# Patient Record
Sex: Male | Born: 1970
Health system: Southern US, Community
[De-identification: ages and names within clinical notes are randomized; demographics above are authoritative.]

## PROBLEM LIST (undated history)

## (undated) DIAGNOSIS — D689 Coagulation defect, unspecified: Secondary | ICD-10-CM

## (undated) DIAGNOSIS — Z87442 Personal history of urinary calculi: Secondary | ICD-10-CM

## (undated) DIAGNOSIS — I739 Peripheral vascular disease, unspecified: Secondary | ICD-10-CM

## (undated) DIAGNOSIS — I7090 Unspecified atherosclerosis: Secondary | ICD-10-CM

## (undated) DIAGNOSIS — M1990 Unspecified osteoarthritis, unspecified site: Secondary | ICD-10-CM

## (undated) DIAGNOSIS — I8393 Asymptomatic varicose veins of bilateral lower extremities: Secondary | ICD-10-CM

## (undated) HISTORY — DX: Unspecified atherosclerosis: I70.90

## (undated) HISTORY — DX: Unspecified osteoarthritis, unspecified site: M19.90

## (undated) HISTORY — DX: Coagulation defect, unspecified: D68.9

## (undated) HISTORY — DX: Asymptomatic varicose veins of bilateral lower extremities: I83.93

## (undated) HISTORY — DX: Peripheral vascular disease, unspecified: I73.9

---

## 2002-08-12 HISTORY — PX: CHOLECYSTECTOMY: SHX55

## 2003-08-13 HISTORY — PX: GASTRIC BYPASS: SHX52

## 2007-07-07 DIAGNOSIS — M25569 Pain in unspecified knee: Secondary | ICD-10-CM | POA: Insufficient documentation

## 2007-07-07 DIAGNOSIS — M5137 Other intervertebral disc degeneration, lumbosacral region: Secondary | ICD-10-CM | POA: Insufficient documentation

## 2017-07-29 ENCOUNTER — Telehealth: Payer: Self-pay

## 2017-07-29 NOTE — Telephone Encounter (Signed)
If deemed necessary as per primary care physician in the future I would be happy to do workup for low back pain including lumbar spine MRI and EMG and nerve conduction studies. He appears to have had lumbar spine and thoracic spine x-rays in the recent past. We may not have all the records from his outside physician. He was seen by a pain management doctor.  Nothing further needed.

## 2017-07-29 NOTE — Telephone Encounter (Signed)
I called pt per Dr. Rexene Alberts request, and advised him that Dr. Rexene Alberts does not provide any chronic pain management for back pain, including muscle relaxants. She would be more willing to order diagnostic testing but cannot refill his current medications. Pt asked me to cancel his appt for tomorrow with Dr. Rexene Alberts tomorrow and he will seek care with a pain clinic instead. Pt was very appreciative of my call. appt cancelled.

## 2017-07-30 ENCOUNTER — Ambulatory Visit: Payer: Self-pay | Admitting: Neurology

## 2017-08-18 ENCOUNTER — Other Ambulatory Visit: Payer: Self-pay

## 2017-08-18 DIAGNOSIS — I83813 Varicose veins of bilateral lower extremities with pain: Secondary | ICD-10-CM

## 2017-08-22 ENCOUNTER — Ambulatory Visit (HOSPITAL_COMMUNITY)
Admission: RE | Admit: 2017-08-22 | Discharge: 2017-08-22 | Disposition: A | Discharge status: Home / Self Care | Payer: 59 | Source: Ambulatory Visit | Attending: Vascular Surgery | Admitting: Vascular Surgery

## 2017-08-22 ENCOUNTER — Encounter: Payer: Self-pay | Admitting: Vascular Surgery

## 2017-08-22 ENCOUNTER — Ambulatory Visit (INDEPENDENT_AMBULATORY_CARE_PROVIDER_SITE_OTHER): Payer: 59 | Admitting: Vascular Surgery

## 2017-08-22 VITALS — BP 120/75 | HR 64 | Temp 97.4°F | Resp 16 | Ht 76.0 in | Wt 224.0 lb

## 2017-08-22 DIAGNOSIS — I872 Venous insufficiency (chronic) (peripheral): Secondary | ICD-10-CM | POA: Diagnosis not present

## 2017-08-22 DIAGNOSIS — Z6827 Body mass index (BMI) 27.0-27.9, adult: Secondary | ICD-10-CM | POA: Diagnosis not present

## 2017-08-22 DIAGNOSIS — I739 Peripheral vascular disease, unspecified: Secondary | ICD-10-CM | POA: Diagnosis not present

## 2017-08-22 DIAGNOSIS — M79606 Pain in leg, unspecified: Secondary | ICD-10-CM | POA: Diagnosis not present

## 2017-08-22 DIAGNOSIS — I83813 Varicose veins of bilateral lower extremities with pain: Secondary | ICD-10-CM | POA: Insufficient documentation

## 2017-08-22 DIAGNOSIS — R7301 Impaired fasting glucose: Secondary | ICD-10-CM | POA: Diagnosis not present

## 2017-08-22 DIAGNOSIS — G894 Chronic pain syndrome: Secondary | ICD-10-CM | POA: Diagnosis not present

## 2017-08-22 DIAGNOSIS — Z0000 Encounter for general adult medical examination without abnormal findings: Secondary | ICD-10-CM | POA: Diagnosis not present

## 2017-08-22 NOTE — Progress Notes (Signed)
Requested by:  Self referral   Reason for consultation: Bilateral varicose veins    History of Present Illness   Paul Sawyer is a 47 y.o. (January 17, 1971) male prior PE who presents with chief complaint: bilateral leg swelling.   The patient notes he previously had a gastric bypass procedure and concomitant with weight loss, he began developing significant bilateral leg swelling.  The patient's symptoms include: swelling with standing, burning in thighs and calf, and slight bursting sensation Iegs.  The patient has had known history of DVT, known history of varicose vein, no history of venous stasis ulcers, no history of  Lymphedema and known history of skin changes in lower legs.  There is no family history of venous disorders.  The patient has never used compression stockings in the past.  Past Medical History: Morbid obesity Chronic back pain  Past Surgical History: Gastric bypass  Social History   Socioeconomic History  . Marital status: Married    Spouse name: Not on file  . Number of children: Not on file  . Years of education: Not on file  . Highest education level: Not on file  Social Needs  . Financial resource strain: Not on file  . Food insecurity - worry: Not on file  . Food insecurity - inability: Not on file  . Transportation needs - medical: Not on file  . Transportation needs - non-medical: Not on file  Occupational History  . Not on file  Tobacco Use  . Smoking status: Never Smoker  . Smokeless tobacco: Never Used  Substance and Sexual Activity  . Alcohol use: Not on file  . Drug use: Not on file  . Sexual activity: Not on file  Other Topics Concern  . Not on file  Social History Narrative  . Not on file   Family History: patient is unable to detail the medical history of his parents   Current Outpatient Medications  Medication Sig Dispense Refill  . Buprenorphine HCl (BELBUCA) 150 MCG FILM Place inside cheek. Takes 150-263mcg     No current  facility-administered medications for this visit.     No Known Allergies  REVIEW OF SYSTEMS (negative unless checked):   Cardiac:  []  Chest pain or chest pressure? []  Shortness of breath upon activity? []  Shortness of breath when lying flat? []  Irregular heart rhythm?  Vascular:  [x]  Pain in calf, thigh, or hip brought on by walking? []  Pain in feet at night that wakes you up from your sleep? []  Blood clot in your veins? [x]  Leg swelling?  Pulmonary:  []  Oxygen at home? []  Productive cough? []  Wheezing?  Neurologic:  []  Sudden weakness in arms or legs? []  Sudden numbness in arms or legs? []  Sudden onset of difficult speaking or slurred speech? []  Temporary loss of vision in one eye? []  Problems with dizziness?  Gastrointestinal:  []  Blood in stool? []  Vomited blood?  Genitourinary:  []  Burning when urinating? []  Blood in urine?  Psychiatric:  []  Major depression  Hematologic:  []  Bleeding problems? []  Problems with blood clotting?  Dermatologic:  []  Rashes or ulcers?  Constitutional:  []  Fever or chills?  Ear/Nose/Throat:  []  Change in hearing? []  Nose bleeds? []  Sore throat?  Musculoskeletal:  [x]  Back pain? []  Joint pain? []  Muscle pain?   Physical Examination     Vitals:   08/22/17 1518  BP: 120/75  Pulse: 64  Resp: 16  Temp: (!) 97.4 F (36.3 C)  TempSrc: Oral  SpO2:  97%  Weight: 224 lb (101.6 kg)  Height: 6\' 4"  (1.93 m)   Body mass index is 27.27 kg/m.  General Alert, O x 3, WD, NAD  Head [place]/AT,    Ear/Nose/ Throat Hearing grossly intact, nares without erythema or drainage, oropharynx without Erythema or Exudate, Mallampati score: 3,   Eyes PERRLA, EOMI,    Neck Supple, mid-line trachea,    Pulmonary Sym exp, good B air movt, CTA B  Cardiac RRR, Nl S1, S2, no Murmurs, No rubs, No S3,S4  Vascular Vessel Right Left  Radial Palpable Palpable  Brachial Palpable Palpable  Carotid Palpable, No Bruit Palpable, No Bruit  Aorta  Not palpable N/A  Femoral Palpable Palpable  Popliteal Not palpable Not palpable  PT Not palpable Not palpable  DP Faintly palpable Faintly palpable    Gastro- intestinal soft, non-distended, non-tender to palpation, No guarding or rebound, no HSM, no masses, no CVAT B, No palpable prominent aortic pulse,    Musculo- skeletal M/S 5/5 throughout  , Extremities without ischemic changes  , Non-pitting edema present: 1-2+ B, Varicosities present: large varicosities L>R (extending posterior also), No Lipodermatosclerosis present  Neurologic Cranial nerves 2-12 intact , Pain and light touch intact in extremities , Motor exam as listed above  Psychiatric Judgement intact, Mood & affect appropriate for pt's clinical situation  Dermatologic See M/S exam for extremity exam, No rashes otherwise noted  Lymphatic  Palpable lymph nodes: None    Non-invasive Vascular Imaging   BLE Venous Insufficiency Duplex (08/22/2017):   RLE:   no DVT and SVT,   no GSV reflux,   no SSV reflux,  + deep venous reflux: SFJ, CFV  LLE:  no DVT and SVT,   + GSV reflux: proximal segment, 2.7-3.7 mm  no SSV reflux,  + deep venous reflux: SFJ, CFV   Medical Decision Making   Paul Sawyer is a 47 y.o. male who presents with: BLE chronic venous insufficiency (C3 Es,p As,d Pr-o), varicose veins with complications   BLE ABI to screen for PAD given difficulty palpating pulses.  Will obtain in next 4 weeks  Based on the patient's history and examination, I recommend: compressive therapy.  I discussed with the patient the use of her 20-30 mm thigh high compression stockings and need for 3 month trial of such.  Will setup Vein Clinic follow up at next visit as I want verify no arterial issues first.  I suspect the B venous reflux duplex to be inaccurate today due to technical issues, so I would repeat it in 3 months.  Thank you for allowing [place] to participate in this patient's care.   Adele Barthel, MD,  FACS [facility] [place] Office: [phone] Pager: [phone]  08/22/2017, 4:26 PM

## 2017-08-25 NOTE — Addendum Note (Signed)
Addended by: Lianne Cure A on: 08/25/2017 01:23 PM   Modules accepted: Orders

## 2017-09-10 MED FILL — oxyCODONE HCL 5 MG TABS: 5 | 2 days supply | Qty: 12 | Fill #0

## 2017-09-10 MED FILL — AMOXICILLIN 875 MG TABLET: 875 | 7 days supply | Qty: 14 | Fill #0

## 2017-09-10 MED FILL — CHLORHEXIDINE 0.12% RINSE: 0.12 | 16 days supply | Qty: 473 | Fill #0

## 2017-09-10 NOTE — Progress Notes (Signed)
    Established Venous Insufficiency   History of Present Illness   Paul Sawyer is a 47 y.o. (Dec 25, 1970) male who presents with chief complaint: burning in legs.  The patient's symptoms have not progressed.  The patient's symptoms are: swelling in both legs and burning.  The patient is compliant with compression stockings.  The patient returns today for BLE ABI.  The patient's PMH, PSH, SH, and FamHx are unchanged from 08/22/17.  Current Outpatient Medications  Medication Sig Dispense Refill  . Buprenorphine HCl (BELBUCA) 150 MCG FILM Place inside cheek. Takes 150-267mcg     No current facility-administered medications for this visit.    On ROS today: continue neuropathic sx, continue bilateral leg swelling   Physical Examination   Vitals:   09/17/17 0845  BP: 122/79  Pulse: 69  Resp: 18  Temp: 97.6 F (36.4 C)  TempSrc: Oral  SpO2: 97%  Weight: 226 lb (102.5 kg)  Height: 6' 4.5" (1.943 m)   Body mass index is 27.15 kg/m.  General Alert, O x 3, WD, NAD  Pulmonary Sym exp, good B air movt, CTA B  Cardiac RRR, Nl S1, S2, no Murmurs, No rubs, No S3,S4  Vascular Vessel Right Left  Radial Palpable Palpable  Brachial Palpable Palpable  Carotid Palpable, No Bruit Palpable, No Bruit  Aorta Not palpable N/A  Femoral Palpable Palpable  Popliteal Not palpable Not palpable  PT Not palpable Not palpable  DP Faintly palpable Faintly palpable    Gastro- intestinal soft, non-distended, non-tender to palpation, No guarding or rebound, no HSM, no masses, no CVAT B, No palpable prominent aortic pulse,    Musculo- skeletal M/S 5/5 throughout  , Extremities without ischemic changes  , Non-pitting edema present: 1-2+ B, Varicosities present: large varicosities L>R, No Lipodermatosclerosis present  Neurologic Pain and light touch intact in extremities , Motor exam as listed above    Non-invasive Vascular Imaging   ABI (09/17/2017)  R:   ABI: 1.31,   PT: tri  DP:  tri  TBI:  1.19  L:   ABI: 1.18,   PT: tri  DP: tri  TBI: 1.18   Medical Decision Making   Paul Sawyer is a 47 y.o. male who presents with: BLE leg chronic venous insufficiency (C3 Es,p As,d Pr-o)   On ABI, there is no evidence of PAD.  Patient already has follow up with Vein Clinic in 3 months.  Use of compression stockings reiterated.  Thank you for allowing [place] to participate in this patient's care.   Adele Barthel, MD, FACS [facility] [place] Office: [phone] Pager: [phone]

## 2017-09-17 ENCOUNTER — Ambulatory Visit (INDEPENDENT_AMBULATORY_CARE_PROVIDER_SITE_OTHER): Payer: 59 | Admitting: Vascular Surgery

## 2017-09-17 ENCOUNTER — Ambulatory Visit (HOSPITAL_COMMUNITY)
Admission: RE | Admit: 2017-09-17 | Discharge: 2017-09-17 | Disposition: A | Discharge status: Home / Self Care | Payer: 59 | Source: Ambulatory Visit | Attending: Vascular Surgery | Admitting: Vascular Surgery

## 2017-09-17 ENCOUNTER — Other Ambulatory Visit: Payer: Self-pay

## 2017-09-17 ENCOUNTER — Encounter: Payer: Self-pay | Admitting: Vascular Surgery

## 2017-09-17 VITALS — BP 122/79 | HR 69 | Temp 97.6°F | Resp 18 | Ht 76.5 in | Wt 226.0 lb

## 2017-09-17 DIAGNOSIS — I872 Venous insufficiency (chronic) (peripheral): Secondary | ICD-10-CM | POA: Diagnosis not present

## 2017-09-17 DIAGNOSIS — I739 Peripheral vascular disease, unspecified: Secondary | ICD-10-CM | POA: Insufficient documentation

## 2017-09-23 ENCOUNTER — Ambulatory Visit: Payer: Self-pay | Admitting: Diagnostic Neuroimaging

## 2017-09-25 MED FILL — oxyCODONE HCL 5 MG TABS: 5 | 1 days supply | Qty: 8 | Fill #0

## 2017-09-25 MED FILL — CYCLOBENZAPRINE 10 MG TAB: 10 | 21 days supply | Qty: 21 | Fill #0

## 2017-10-13 DIAGNOSIS — M5030 Other cervical disc degeneration, unspecified cervical region: Secondary | ICD-10-CM | POA: Diagnosis not present

## 2017-10-13 DIAGNOSIS — M79669 Pain in unspecified lower leg: Secondary | ICD-10-CM | POA: Diagnosis not present

## 2017-10-13 DIAGNOSIS — F1129 Opioid dependence with unspecified opioid-induced disorder: Secondary | ICD-10-CM | POA: Diagnosis not present

## 2017-10-13 DIAGNOSIS — Z79891 Long term (current) use of opiate analgesic: Secondary | ICD-10-CM | POA: Diagnosis not present

## 2017-10-13 DIAGNOSIS — M545 Low back pain: Secondary | ICD-10-CM | POA: Diagnosis not present

## 2017-10-13 DIAGNOSIS — G894 Chronic pain syndrome: Secondary | ICD-10-CM | POA: Diagnosis not present

## 2017-10-14 MED FILL — SUBOXONE 8 MG-2 MG SL FILM: 8-2 | 28 days supply | Qty: 27 | Fill #0

## 2017-11-19 DIAGNOSIS — M545 Low back pain: Secondary | ICD-10-CM | POA: Diagnosis not present

## 2017-11-19 DIAGNOSIS — G894 Chronic pain syndrome: Secondary | ICD-10-CM | POA: Diagnosis not present

## 2017-11-19 DIAGNOSIS — F1129 Opioid dependence with unspecified opioid-induced disorder: Secondary | ICD-10-CM | POA: Diagnosis not present

## 2017-11-19 DIAGNOSIS — M79669 Pain in unspecified lower leg: Secondary | ICD-10-CM | POA: Diagnosis not present

## 2017-11-21 MED FILL — BUPRENORPHINE HCL-NALOXONE: 8-2 | 28 days supply | Qty: 28 | Fill #0

## 2017-12-16 ENCOUNTER — Other Ambulatory Visit: Payer: Self-pay

## 2017-12-16 ENCOUNTER — Encounter: Payer: Self-pay | Admitting: Vascular Surgery

## 2017-12-16 ENCOUNTER — Ambulatory Visit (INDEPENDENT_AMBULATORY_CARE_PROVIDER_SITE_OTHER): Payer: 59 | Admitting: Vascular Surgery

## 2017-12-16 VITALS — BP 123/77 | HR 69 | Temp 97.8°F | Resp 18 | Ht 76.0 in | Wt 225.0 lb

## 2017-12-16 DIAGNOSIS — I872 Venous insufficiency (chronic) (peripheral): Secondary | ICD-10-CM | POA: Diagnosis not present

## 2017-12-16 NOTE — Progress Notes (Signed)
[facility] [place]  Patient name: Paul Sawyer MRN: [id number] DOB: 1971-07-18 Sex: male  REASON FOR VISIT: Venous hypertension aloe up  HPI: Paul Sawyer is a 47 y.o. male here today for follow-up.  He had undergone prior venous and arterial studies.  He has discomfort with swelling on both lower extremities and reports a stinging discomfort over the varicosities over his anterior thighs and calves.  This is bilateral.  He does have a prior history of pulmonary embolus and DVT.  He feels that this may have been associated with dehydration since he was ill.  He was on appropriate anticoagulation and then this was discontinued.  He has had no other thromboembolic events.  His prior duplex study revealed minimal reflux in either his deep or superficial system and no dilatation in his saphenous veins bilaterally.  He did have reflux at the saphenofemoral junction.  He has worn compression garments and reports these actually make his legs feel worse.  Past Medical History:  Diagnosis Date  . PAD (peripheral artery disease) ([place])   . Varicose veins of both lower extremities     Family History  Problem Relation Age of Onset  . Diabetes Father   . Heart disease Father     SOCIAL HISTORY: Social History   Tobacco Use  . Smoking status: Never Smoker  . Smokeless tobacco: Never Used  Substance Use Topics  . Alcohol use: Yes    Alcohol/week: 0.6 oz    Types: 1 Glasses of wine per week    Comment: weekly    No Known Allergies  Current Outpatient Medications  Medication Sig Dispense Refill  . Buprenorphine HCl (BELBUCA) 150 MCG FILM Place inside cheek. Takes 150-280mcg    . Buprenorphine HCl-Naloxone HCl 8-2 MG FILM   0  . amoxicillin (AMOXIL) 875 MG tablet   0  . chlorhexidine (PERIDEX) 0.12 % solution   0  . cyclobenzaprine (FLEXERIL) 10 MG tablet   0  . oxyCODONE (OXY IR/ROXICODONE) 5 MG immediate release tablet    0   No current facility-administered medications for this visit.     REVIEW OF SYSTEMS:  [X]  denotes positive finding, [ ]  denotes negative finding Cardiac  Comments:  Chest pain or chest pressure:    Shortness of breath upon exertion:    Short of breath when lying flat:    Irregular heart rhythm:        Vascular    Pain in calf, thigh, or hip brought on by ambulation:    Pain in feet at night that wakes you up from your sleep:     Blood clot in your veins: x   Leg swelling:  x         PHYSICAL EXAM: Vitals:   12/16/17 0833  BP: 123/77  Pulse: 69  Resp: 18  Temp: 97.8 F (36.6 C)  TempSrc: Oral  SpO2: 100%  Weight: 225 lb (102.1 kg)  Height: 6\' 4"  (1.93 m)    GENERAL: The patient is a well-nourished male, in no acute distress. The vital signs are documented above. CARDIOVASCULAR: He does have scattered varicosities most prickly over his anterior thighs and calves.  Also scattered telangiectasia bilaterally. PULMONARY: There is good air exchange  MUSCULOSKELETAL: There are no major deformities or cyanosis. NEUROLOGIC: No focal weakness or paresthesias are detected. SKIN: There are no ulcers or rashes noted. PSYCHIATRIC: The patient has a normal affect.  DATA:  I reviewed his duplex and reimaged  his veins with SonoSite ultrasound.  He does have a small caliber saphenous vein bilaterally with no evidence of communication to these varicosities  MEDICAL ISSUES: I see no role for ablation of the saphenous vein since he does not have reflux or dilatation.  I explained the only treatment option would be sclerotherapy of his varicosities which would hopefully improve the symptoms that he has with a burning and stinging over his varicosities.  I doubt that this would improve his lower extremity swelling but would be possible and would be a wait and see after sclerotherapy.  I did explain that unfortunately insurance plans do not cover sclerotherapy and he has discussed that the  procedure and out-of-pocket expense with Richelle Ito.  He will make determination if he wishes to proceed.    Rosetta Posner, MD FACS [facility] Tel [phone] Pager [phone]

## 2017-12-22 DIAGNOSIS — G894 Chronic pain syndrome: Secondary | ICD-10-CM | POA: Diagnosis not present

## 2017-12-22 DIAGNOSIS — F1129 Opioid dependence with unspecified opioid-induced disorder: Secondary | ICD-10-CM | POA: Diagnosis not present

## 2017-12-22 DIAGNOSIS — M545 Low back pain: Secondary | ICD-10-CM | POA: Diagnosis not present

## 2017-12-22 DIAGNOSIS — M79669 Pain in unspecified lower leg: Secondary | ICD-10-CM | POA: Diagnosis not present

## 2017-12-23 MED FILL — BUPRENORPHIN-NALOXON 8-2 MG: 8-2 | 28 days supply | Qty: 28 | Fill #0

## 2018-01-19 DIAGNOSIS — M545 Low back pain: Secondary | ICD-10-CM | POA: Diagnosis not present

## 2018-01-19 DIAGNOSIS — F1129 Opioid dependence with unspecified opioid-induced disorder: Secondary | ICD-10-CM | POA: Diagnosis not present

## 2018-01-19 DIAGNOSIS — M79669 Pain in unspecified lower leg: Secondary | ICD-10-CM | POA: Diagnosis not present

## 2018-01-19 DIAGNOSIS — G894 Chronic pain syndrome: Secondary | ICD-10-CM | POA: Diagnosis not present

## 2018-01-21 DIAGNOSIS — Z79899 Other long term (current) drug therapy: Secondary | ICD-10-CM | POA: Diagnosis not present

## 2018-01-26 DIAGNOSIS — B070 Plantar wart: Secondary | ICD-10-CM | POA: Diagnosis not present

## 2018-01-27 ENCOUNTER — Other Ambulatory Visit: Payer: Self-pay | Admitting: *Deleted

## 2018-01-27 ENCOUNTER — Telehealth: Payer: Self-pay | Admitting: Vascular Surgery

## 2018-01-27 ENCOUNTER — Telehealth: Payer: Self-pay | Admitting: *Deleted

## 2018-01-27 DIAGNOSIS — I83813 Varicose veins of bilateral lower extremities with pain: Secondary | ICD-10-CM

## 2018-01-27 MED FILL — BUPRENORPHIN-NALOXON 8-2 MG: 8-2 | 28 days supply | Qty: 28 | Fill #0

## 2018-01-27 NOTE — Telephone Encounter (Signed)
sch appt spk to pt 04/08/18 3pm [facility] 345pm f/u MD

## 2018-01-27 NOTE — Telephone Encounter (Signed)
The patient called in because he says the stinging pain from his varicosities has gotten worse since his last visit. He is wondering what are his options for treatment. The study preformed in 08/2017 showed small diameters and minimal reflux so Dr. Early thought sclero could help. Pt has swelling too and sclero would not help that. Sclero would not be covered by insurance. The patient would like another reflux study to see if the diameters have enlarged and if the reflux has worsened. He has met his oop max, so repeating the study would be covered by insurance. I have asked that a repeat study be done in Aug/Sept and a visit with Dr. Dickson. This will show if his venous disease has progressed to a point where laser would be beneficial and would meet the ins requirements.  

## 2018-02-05 DIAGNOSIS — R351 Nocturia: Secondary | ICD-10-CM | POA: Diagnosis not present

## 2018-02-05 DIAGNOSIS — R3912 Poor urinary stream: Secondary | ICD-10-CM | POA: Diagnosis not present

## 2018-02-05 DIAGNOSIS — Z87442 Personal history of urinary calculi: Secondary | ICD-10-CM | POA: Diagnosis not present

## 2018-02-05 DIAGNOSIS — N401 Enlarged prostate with lower urinary tract symptoms: Secondary | ICD-10-CM | POA: Diagnosis not present

## 2018-02-25 MED FILL — BUPRENORPHIN-NALOXON 8-2 MG: 8-2 | 28 days supply | Qty: 28 | Fill #1

## 2018-03-23 DIAGNOSIS — M79669 Pain in unspecified lower leg: Secondary | ICD-10-CM | POA: Diagnosis not present

## 2018-03-23 DIAGNOSIS — F1129 Opioid dependence with unspecified opioid-induced disorder: Secondary | ICD-10-CM | POA: Diagnosis not present

## 2018-03-23 DIAGNOSIS — M545 Low back pain: Secondary | ICD-10-CM | POA: Diagnosis not present

## 2018-03-23 DIAGNOSIS — G894 Chronic pain syndrome: Secondary | ICD-10-CM | POA: Diagnosis not present

## 2018-03-25 MED FILL — BUPRENORPHINE HCL-NALOXONE: 8-2 | 28 days supply | Qty: 28 | Fill #0

## 2018-04-08 ENCOUNTER — Ambulatory Visit (HOSPITAL_COMMUNITY)
Admission: RE | Admit: 2018-04-08 | Discharge: 2018-04-08 | Disposition: A | Discharge status: Home / Self Care | Payer: 59 | Source: Ambulatory Visit | Attending: Vascular Surgery | Admitting: Vascular Surgery

## 2018-04-08 ENCOUNTER — Encounter: Payer: Self-pay | Admitting: Vascular Surgery

## 2018-04-08 ENCOUNTER — Ambulatory Visit (INDEPENDENT_AMBULATORY_CARE_PROVIDER_SITE_OTHER): Payer: 59 | Admitting: Vascular Surgery

## 2018-04-08 VITALS — BP 126/75 | HR 62 | Temp 97.3°F | Resp 16 | Ht 76.0 in | Wt 224.0 lb

## 2018-04-08 DIAGNOSIS — I83813 Varicose veins of bilateral lower extremities with pain: Secondary | ICD-10-CM | POA: Insufficient documentation

## 2018-04-08 DIAGNOSIS — I872 Venous insufficiency (chronic) (peripheral): Secondary | ICD-10-CM | POA: Diagnosis not present

## 2018-04-08 NOTE — Progress Notes (Signed)
Patient name: Paul Sawyer MRN: [id number] DOB: [birth year] Sex: male  REASON FOR VISIT:   Follow-up of chronic venous insufficiency.  HPI:   Paul Sawyer is a pleasant 47 y.o. male who was last seen by Dr. Sherren Mocha Early on 12/16/2017.  Patient had discomfort and swelling of both lower extremities with painful varicose veins bilaterally.  Patient also has a history of pulmonary embolus and DVT.  He discussed sclerotherapy with the patient.  The patient comes in for a follow-up visit.  Patient continues to have some aching pain and heaviness in both legs which is aggravated by sitting and standing and relieved somewhat with elevation.  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  He denies any previous history of DVT or phlebitis.  Current Outpatient Medications  Medication Sig Dispense Refill  . amoxicillin (AMOXIL) 875 MG tablet   0  . Buprenorphine HCl (BELBUCA) 150 MCG FILM Place inside cheek. Takes 150-23mcg    . Buprenorphine HCl-Naloxone HCl 8-2 MG FILM   0  . chlorhexidine (PERIDEX) 0.12 % solution   0  . cyclobenzaprine (FLEXERIL) 10 MG tablet   0  . oxyCODONE (OXY IR/ROXICODONE) 5 MG immediate release tablet   0   No current facility-administered medications for this visit.     REVIEW OF SYSTEMS:  [X]  denotes positive finding, [ ]  denotes negative finding Vascular    Leg swelling    Cardiac    Chest pain or chest pressure:    Shortness of breath upon exertion:    Short of breath when lying flat:    Irregular heart rhythm:    Constitutional    Fever or chills:     PHYSICAL EXAM:   Vitals:   04/08/18 1605  BP: 126/75  Pulse: 62  Resp: 16  Temp: (!) 97.3 F (36.3 C)  SpO2: 100%  Weight: 224 lb (101.6 kg)  Height: 6\' 4"  (1.93 m)    GENERAL: The patient is a well-nourished male, in no acute distress. The vital signs are documented above. CARDIOVASCULAR: There is a regular rate and rhythm. PULMONARY: There is good air exchange bilaterally without  wheezing or rales. VENOUS EXAM: The patient has spider veins and reticular veins bilaterally on the thighs and leg. He has mild bilateral lower extremity swelling.  DATA:   VENOUS DUPLEX: I have independently interpreted his venous duplex scan today.  On the right side there is no evidence of DVT or superficial thrombophlebitis.  There is no significant deep venous reflux or superficial venous reflux noted.  On the left side there is no evidence of DVT or superficial thrombophlebitis.  There is deep venous reflux involving the common femoral vein.  There is some reflux at the saphenofemoral junction and very proximal saphenous vein but the vein is competent below that it is not dilated.  MEDICAL ISSUES:   CHRONIC VENOUS INSUFFICIENCY: This patient does have some deep venous reflux on the left but no significant superficial venous reflux.  I have discussed with him the importance of intermittent leg elevation the proper positioning for this.  I also written him a prescription for a treadmill desk as I think this would significantly help his chronic venous disease given that he sits all day at work.  I written a prescription for knee-high compression stockings with a gradient of 15 to 20 mmHg.  I have encouraged him to avoid prolonged sitting and standing.  We discussed the importance of exercise especially walking.  If his symptoms progress in the future and certainly we could repeat his duplex scan.  However, based on his study today he is not a candidate for endovenous laser ablation.  We discussed sclerotherapy but there would be some okay thank you risk of recurrence given his deep venous reflux on the left.  [name] [facility] [phone]

## 2018-04-24 MED FILL — BUPRENORPHINE HCL-NALOXONE: 8-2 | 28 days supply | Qty: 28 | Fill #1

## 2018-05-18 DIAGNOSIS — M79669 Pain in unspecified lower leg: Secondary | ICD-10-CM | POA: Diagnosis not present

## 2018-05-18 DIAGNOSIS — F1129 Opioid dependence with unspecified opioid-induced disorder: Secondary | ICD-10-CM | POA: Diagnosis not present

## 2018-05-18 DIAGNOSIS — G894 Chronic pain syndrome: Secondary | ICD-10-CM | POA: Diagnosis not present

## 2018-05-18 DIAGNOSIS — M545 Low back pain: Secondary | ICD-10-CM | POA: Diagnosis not present

## 2018-05-18 MED FILL — CYCLOBENZAPRINE 10 MG TAB: 10 | 20 days supply | Qty: 60 | Fill #0 | Status: TO

## 2018-05-22 MED FILL — BUPRENORPHINE HCL-NALOXONE: 8-2 | 28 days supply | Qty: 28 | Fill #0

## 2018-05-25 MED FILL — CYCLOBENZAPRINE HCL 10 MG T: 10 | 20 days supply | Qty: 60 | Fill #0

## 2018-07-07 DIAGNOSIS — M9902 Segmental and somatic dysfunction of thoracic region: Secondary | ICD-10-CM | POA: Diagnosis not present

## 2018-07-08 DIAGNOSIS — M9902 Segmental and somatic dysfunction of thoracic region: Secondary | ICD-10-CM | POA: Diagnosis not present

## 2018-07-13 MED FILL — BUPRENORPHINE HCL-NALOXONE: 8-2 | 21 days supply | Qty: 21 | Fill #0

## 2018-07-16 ENCOUNTER — Ambulatory Visit: Payer: 59 | Admitting: *Deleted

## 2018-08-03 DIAGNOSIS — M545 Low back pain: Secondary | ICD-10-CM | POA: Diagnosis not present

## 2018-08-03 DIAGNOSIS — M79669 Pain in unspecified lower leg: Secondary | ICD-10-CM | POA: Diagnosis not present

## 2018-08-03 DIAGNOSIS — F1129 Opioid dependence with unspecified opioid-induced disorder: Secondary | ICD-10-CM | POA: Diagnosis not present

## 2018-08-03 DIAGNOSIS — Z79891 Long term (current) use of opiate analgesic: Secondary | ICD-10-CM | POA: Diagnosis not present

## 2018-08-03 DIAGNOSIS — G894 Chronic pain syndrome: Secondary | ICD-10-CM | POA: Diagnosis not present

## 2018-08-11 MED FILL — CYCLOBENZAPRINE HCL 10 MG T: 10 | 20 days supply | Qty: 60 | Fill #0

## 2018-08-11 MED FILL — BUPRENORPHIN-NALOXON 8-2 MG: 8-2 | 28 days supply | Qty: 28 | Fill #1

## 2018-09-07 MED FILL — BUPRENORPHINE HCL-NALOXONE: 8-2 | 30 days supply | Qty: 30 | Fill #0

## 2018-10-06 MED FILL — BUPRENORPHINE HCL-NALOXONE: 8-2 | 30 days supply | Qty: 30 | Fill #1

## 2018-10-13 MED FILL — CYCLOBENZAPRINE HCL 10 MG T: 10 | 20 days supply | Qty: 60 | Fill #1

## 2018-10-26 DIAGNOSIS — G894 Chronic pain syndrome: Secondary | ICD-10-CM | POA: Diagnosis not present

## 2018-10-26 DIAGNOSIS — M545 Low back pain: Secondary | ICD-10-CM | POA: Diagnosis not present

## 2018-10-26 DIAGNOSIS — M79669 Pain in unspecified lower leg: Secondary | ICD-10-CM | POA: Diagnosis not present

## 2018-10-26 DIAGNOSIS — F1129 Opioid dependence with unspecified opioid-induced disorder: Secondary | ICD-10-CM | POA: Diagnosis not present

## 2018-11-03 MED FILL — BUPRENORPHIN-NALOXON 8-2 MG: 8-2 | 42 days supply | Qty: 42 | Fill #0

## 2018-11-03 MED FILL — CYCLOBENZAPRINE HCL 10 MG T: 10 | 30 days supply | Qty: 30 | Fill #0

## 2018-11-19 ENCOUNTER — Telehealth: Payer: Self-pay | Admitting: *Deleted

## 2018-11-19 NOTE — Telephone Encounter (Signed)
Returning patient's telephone inquiry regarding enlarged veins in his hands and arms and treatment options.  Advised Paul Sawyer that [facility] does not treat veins in arms or hands.   Paul Sawyer was last seen in August 2019 for leg swelling by [name] MD and venous reflux study was also done. Dr. Scot Dock had advised Paul Sawyer to elevate legs when possible, wear knee high compression hose, and walk for exercise. Reviewed the office note and instructions that Dr. Scot Dock had given him.

## 2018-11-27 MED FILL — CYCLOBENZAPRINE HCL 10 MG T: 10 | 30 days supply | Qty: 30 | Fill #1

## 2018-12-11 MED FILL — BUPRENORPHIN-NALOXON 8-2 MG: 8-2 | 42 days supply | Qty: 42 | Fill #1

## 2019-01-15 MED FILL — CYCLOBENZAPRINE HCL 10 MG T: 10 | 30 days supply | Qty: 30 | Fill #2

## 2019-01-18 MED FILL — BUPRENORPHIN-NALOXON 8-2 MG: 8-2 | 42 days supply | Qty: 42 | Fill #2

## 2019-01-19 ENCOUNTER — Telehealth (HOSPITAL_COMMUNITY): Payer: Self-pay | Admitting: Rehabilitation

## 2019-01-19 ENCOUNTER — Other Ambulatory Visit: Payer: Self-pay

## 2019-01-19 DIAGNOSIS — M25529 Pain in unspecified elbow: Secondary | ICD-10-CM

## 2019-01-19 NOTE — Telephone Encounter (Signed)

## 2019-01-20 ENCOUNTER — Ambulatory Visit (HOSPITAL_COMMUNITY)
Admission: RE | Admit: 2019-01-20 | Discharge: 2019-01-20 | Disposition: A | Discharge status: Home / Self Care | Payer: 59 | Source: Ambulatory Visit | Attending: Family | Admitting: Family

## 2019-01-20 ENCOUNTER — Other Ambulatory Visit: Payer: Self-pay

## 2019-01-20 DIAGNOSIS — M25529 Pain in unspecified elbow: Secondary | ICD-10-CM

## 2019-01-26 ENCOUNTER — Telehealth (HOSPITAL_COMMUNITY): Payer: Self-pay | Admitting: Rehabilitation

## 2019-01-26 NOTE — Telephone Encounter (Signed)
The above patient or their representative was contacted and gave the following answers to these questions:         Do you have any of the following symptoms? No Fever                    Cough                   Shortness of breath  Do  you have any of the following other symptoms? No  muscle pain         vomiting,        diarrhea        rash         weakness        red eye        abdominal pain         bruising         bleeding              joint pain           severe headache  Have you been in contact with someone who was or has been sick in the past 2 weeks? No Yes                 Unsure                         Unable to assess   Does the person that you were in contact with have any of the following symptoms?  Cough         shortness of breath           muscle pain         vomiting,            diarrhea            rash            weakness           fever            red eye           abdominal pain          bruising  or  bleeding                joint pain                severe headache             Have you  or someone you have been in contact with traveled internationally in the last month?  No      If yes, which countries?  Have you  or someone you have been in contact with traveled outside [place] in the last month?  No      If yes, which state and city?  COMMENTS OR ACTION PLAN FOR THIS PATIENT:    

## 2019-01-27 ENCOUNTER — Other Ambulatory Visit: Payer: Self-pay

## 2019-01-27 ENCOUNTER — Ambulatory Visit (INDEPENDENT_AMBULATORY_CARE_PROVIDER_SITE_OTHER): Payer: 59 | Admitting: Vascular Surgery

## 2019-01-27 ENCOUNTER — Encounter: Payer: Self-pay | Admitting: Vascular Surgery

## 2019-01-27 VITALS — BP 117/75 | HR 61 | Temp 98.1°F | Resp 20 | Ht 76.0 in | Wt 225.0 lb

## 2019-01-27 DIAGNOSIS — G540 Brachial plexus disorders: Secondary | ICD-10-CM

## 2019-01-27 DIAGNOSIS — I872 Venous insufficiency (chronic) (peripheral): Secondary | ICD-10-CM

## 2019-01-27 NOTE — Progress Notes (Signed)
Patient name: Paul Sawyer MRN: [id number] DOB: 07/16/1971 Sex: male  REASON FOR VISIT:   Bilateral upper extremity pain  HPI:   Paul Sawyer is a pleasant 48 y.o. male who I last saw on 04/08/2018.  The patient at that time had deep venous reflux on the left but no significant superficial venous reflux.  We discussed conservative measures for the treatment of venous insufficiency.  I wrote him a prescription for knee-high compression stockings with a gradient of 15 to 20 mmHg.   Since I saw him last, he had developed some aching and weakness in both arms and spoke to Paul Sawyer in the vein center.  He felt that his symptoms felt much like the symptoms he was having in his leg and he was set up for bilateral venous duplex exam.  He states the symptoms have been going on for several months.  He describes some weakness with activities of his arms such as brushing his teeth.  However the symptoms are not specific to doing activities with his arm over his head.  He denies any arm swelling.  He describes some aching pain in his arms.  He denies any neck pain.  He does not have any occupational history to suggest thoracic outlet syndrome.  He has not had any trauma.  He really does not have significant risk factors for peripheral vascular disease.  He denies any history of diabetes, hypertension, hypercholesterolemia, family history of premature cardiovascular disease, or smoking history.  He has undergone gastric bypass and has lost considerable weight since that time.  Past Medical History:  Diagnosis Date  . PAD (peripheral artery disease) ([place])   . Varicose veins of both lower extremities     Family History  Problem Relation Age of Onset  . Diabetes Father   . Heart disease Father     SOCIAL HISTORY: Social History   Tobacco Use  . Smoking status: Never Smoker  . Smokeless tobacco: Never Used  Substance Use Topics  . Alcohol use: Yes    Alcohol/week: 1.0 standard drinks   Types: 1 Glasses of wine per week    Comment: weekly    Allergies  Allergen Reactions  . Ciprofloxacin     Current Outpatient Medications  Medication Sig Dispense Refill  . Buprenorphine HCl-Naloxone HCl 8-2 MG FILM   0  . cyclobenzaprine (FLEXERIL) 10 MG tablet   0   No current facility-administered medications for this visit.     REVIEW OF SYSTEMS:  [X]  denotes positive finding, [ ]  denotes negative finding Cardiac  Comments:  Chest pain or chest pressure:    Shortness of breath upon exertion:    Short of breath when lying flat:    Irregular heart rhythm:        Vascular    Pain in calf, thigh, or hip brought on by ambulation:    Pain in feet at night that wakes you up from your sleep:     Blood clot in your veins:    Leg swelling:         Pulmonary    Oxygen at home:    Productive cough:     Wheezing:         Neurologic    Sudden weakness in arms or legs:     Sudden numbness in arms or legs:     Sudden onset of difficulty speaking or slurred speech:    Temporary loss of vision in one eye:  Problems with dizziness:         Gastrointestinal    Blood in stool:     Vomited blood:         Genitourinary    Burning when urinating:     Blood in urine:        Psychiatric    Major depression:         Hematologic    Bleeding problems:    Problems with blood clotting too easily:        Skin    Rashes or ulcers:        Constitutional    Fever or chills:     PHYSICAL EXAM:   Vitals:   01/27/19 0916  BP: 117/75  Pulse: 61  Resp: 20  Temp: 98.1 F (36.7 C)  SpO2: 100%  Weight: 225 lb (102.1 kg)  Height: 6\' 4"  (1.93 m)    GENERAL: The patient is a well-nourished male, in no acute distress. The vital signs are documented above. CARDIAC: There is a regular rate and rhythm.  VASCULAR: I do not detect carotid bruits. He has palpable radial pulses and brisk biphasic radial and ulnar signals with Doppler. He has palpable femoral pulses.  He has  biphasic dorsalis pedis and posterior tibial signal in both feet. He has no upper extremity swelling or lower extremity swelling. PULMONARY: There is good air exchange bilaterally without wheezing or rales. ABDOMEN: Soft and non-tender with normal pitched bowel sounds.  MUSCULOSKELETAL: There are no major deformities or cyanosis. NEUROLOGIC: No focal weakness or paresthesias are detected. SKIN: There are no ulcers or rashes noted. PSYCHIATRIC: The patient has a normal affect.  DATA:    UPPER EXTREMITY VENOUS DUPLEX: I reviewed his bilateral upper extremity venous duplex scan that was done on 01/20/2019.  This showed no evidence of deep venous thrombosis or superficial venous thrombosis involving either upper extremity.  MEDICAL ISSUES:   BILATERAL UPPER EXTREMITY WEAKNESS AND ACHING PAIN: I reassured him that he has no evidence of venous disease in either upper extremity based on his duplex scan.  He is had no arm swelling.  I suspect that he could potentially have a neurogenic component to his symptoms possibly related to cervical disc disease or possibly related to thoracic outlet syndrome.  I also reassured him that he had no evidence of arterial insufficiency.  If his symptoms progress then I think the next logical step would be consultation with a neurologist for further work-up here in [place].  If that were not fruitful then I think he could be evaluated by Paul Sawyer at [facility], The he does have significant experience with thoracic outlet syndrome and has the resources to also address neurogenic thoracic outlet syndrome if that were a potential cause.  I will be happy to see him back at any time if other vascular issues arise.  Paul Sawyer [facility] [phone]

## 2019-02-02 DIAGNOSIS — M545 Low back pain: Secondary | ICD-10-CM | POA: Diagnosis not present

## 2019-02-02 DIAGNOSIS — F1129 Opioid dependence with unspecified opioid-induced disorder: Secondary | ICD-10-CM | POA: Diagnosis not present

## 2019-02-02 DIAGNOSIS — M79669 Pain in unspecified lower leg: Secondary | ICD-10-CM | POA: Diagnosis not present

## 2019-02-02 DIAGNOSIS — G894 Chronic pain syndrome: Secondary | ICD-10-CM | POA: Diagnosis not present

## 2019-02-02 MED FILL — MELOXICAM 15 MG TABLET: 15 | 30 days supply | Qty: 30 | Fill #0

## 2019-02-18 ENCOUNTER — Ambulatory Visit (HOSPITAL_COMMUNITY): Payer: 59 | Attending: Neurology

## 2019-02-18 ENCOUNTER — Encounter (HOSPITAL_COMMUNITY): Payer: Self-pay

## 2019-02-18 ENCOUNTER — Other Ambulatory Visit: Payer: Self-pay

## 2019-02-18 DIAGNOSIS — M545 Low back pain, unspecified: Secondary | ICD-10-CM

## 2019-02-18 DIAGNOSIS — M6281 Muscle weakness (generalized): Secondary | ICD-10-CM | POA: Insufficient documentation

## 2019-02-18 DIAGNOSIS — G8929 Other chronic pain: Secondary | ICD-10-CM | POA: Diagnosis not present

## 2019-02-18 DIAGNOSIS — R29898 Other symptoms and signs involving the musculoskeletal system: Secondary | ICD-10-CM | POA: Insufficient documentation

## 2019-02-18 NOTE — Therapy (Addendum)
[place], [place], [address] Phone: [phone]   Fax:  [phone]  Physical Therapy Evaluation  Patient Details  Name: Paul Sawyer MRN: [id number] Date of Birth: [birth year] Referring Provider (PT):  Phillips Odor, MD   Encounter Date: 02/18/2019  PT End of Session - 02/18/19 0907    Visit Number  1    Number of Visits  8    Date for PT Re-Evaluation  03/18/19    Authorization Type  [facility]    Authorization Time Period  02/18/19 to 03/18/19    PT Start Time  0820    PT Stop Time  0903    PT Time Calculation (min)  43 min    Activity Tolerance  Patient tolerated treatment well    Behavior During Therapy  [facility] for tasks assessed/performed       Past Medical History:  Diagnosis Date  . PAD (peripheral artery disease) ([place])   . Varicose veins of both lower extremities     Past Surgical History:  Procedure Laterality Date  . CHOLECYSTECTOMY  2004  . GASTRIC BYPASS  2005    There were no vitals filed for this visit.   Subjective Assessment - 02/18/19 0823    Subjective  Pt reports 15 years ago he found out he has annular tears and DDD at L4-5. Pt reports experiencing chronic pain for years and needing to build his core strentgth up. Pt reports thoracic outlet syndrome due to poor posture. Pt also reports venous insufficiency pain that improves with pain medication. Pt reports most difficulty with hygeine, prolonged cleaning, cooking, walking and standing for long periods. Pt denies numbness/tingling throughout BLE and denies b/b issues. Pt reports some lateral L foot tingling peridoically. Pt reports not knowing if thoracic outlet syndrome issues are vascular or nerve. Pt reports heaviness through BUE and BLE 24/7. Pt reports increase in pain when performing tasks in prolonged positions and relief with pain medication and lying supine to rest. Pt reports ability to walk on treadmill for 1-2 miles but  experiences heaviness sensation throughout extremities.    Limitations  Lifting;Standing;Walking;House hold activities    How long can you sit comfortably?  no issues    How long can you stand comfortably?  30 minutes    How long can you walk comfortably?  1-2 miles, heaviness through BUE and BLE    Patient Stated Goals  Get back cramping to stop, improve overall strength of back  and posture    Currently in Pain?  Yes   Pt reports "discomfort" throughout BLE/BUE   Pain Score  3     Pain Location  Back    Pain Orientation  Lower    Pain Descriptors / Indicators  Throbbing    Pain Type  Chronic pain    Pain Radiating Towards  none    Pain Onset  More than a month ago    Pain Frequency  Constant    Aggravating Factors   bending over    Pain Relieving Factors  pain medication, laying supine    Effect of Pain on Daily Activities  increased         [facility] PT Assessment - 02/18/19 0001      Assessment   Medical Diagnosis  LBP    Referring Provider (PT)   Phillips Odor, MD    Onset Date/Surgical Date  --   approximately 15 years ago   Next MD Visit  4-6 weeks    Prior Therapy  yes for LBP, no results due to lack of consistency      Precautions   Precautions  None      Restrictions   Weight Bearing Restrictions  No      Balance Screen   Has the patient fallen in the past 6 months  No    Has the patient had a decrease in activity level because of a fear of falling?   No    Is the patient reluctant to leave their home because of a fear of falling?   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    Leisure  hanging out with family      Observation/Other Assessments   Focus on Therapeutic Outcomes (FOTO)   to be compelted next time      Sensation   Light Touch  Appears Intact      Functional Tests   Functional tests  Sit to Stand      Sit to Stand    Comments  30 sec STS: 14 reps      Posture/Postural Control   Posture/Postural Control  Postural limitations    Postural Limitations  Rounded Shoulders;Forward head;Increased thoracic kyphosis;Decreased lumbar lordosis;Posterior pelvic tilt    Posture Comments  In supine, head and posterior shoulders unable to touch mat due to rounded shoulders/forward head      ROM / Strength   AROM / PROM / Strength  AROM;Strength      AROM   AROM Assessment Site  Lumbar    Lumbar Flexion  9.5 in from floor    Lumbar Extension  WFL    Lumbar - Right Side Bend  23.5 in from floor    Lumbar - Left Side Bend  23 in from floor    Lumbar - Right Rotation  WFL    Lumbar - Left Rotation  [facility]      Strength   Strength Assessment Site  Hip;Knee;Ankle    Right Hip Flexion  4/5    Right Hip Extension  3+/5    Right Hip ABduction  3+/5    Left Hip Flexion  4/5    Left Hip Extension  3+/5    Left Hip ABduction  3+/5    Right Knee Flexion  3+/5    Right Knee Extension  5/5    Left Knee Flexion  3+/5    Left Knee Extension  5/5    Right Ankle Dorsiflexion  5/5    Left Ankle Dorsiflexion  5/5      Flexibility   Soft Tissue Assessment /Muscle Length  yes    Hamstrings  90/90: 115 deg bil    Quadriceps  + Ely's bilaterally   in prone, knee flexion to 95 deg bil     Palpation   Spinal mobility  Hypomobility throughout all lumbar and thoracic vertebrae    Palpation comment  Pt denies TTP throughout lumbar paraspinals      Special Tests    Special Tests  Lumbar    Lumbar Tests  Slump Test      Slump test   Findings  Negative    Comment  bilaterally      Balance   Balance Assessed  Yes      Static Standing Balance   Static  Standing - Balance Support  No upper extremity supported    Static Standing Balance -  Activities   Single Leg Stance - Right Leg;Single Leg Stance - Left Leg    Static Standing - Comment/# of Minutes  R: 17.5 sec, L: 45 sec       Objective measurements completed on  examination: See above findings.    PT Education - 02/18/19 0907    Education Details  Assessment findings, POC, initiated HEP    Person(s) Educated  Patient    Methods  Explanation;Demonstration;Handout    Comprehension  Verbalized understanding;Returned demonstration       PT Short Term Goals - 02/18/19 1031      PT SHORT TERM GOAL #1   Title  Pt will improve SLS to 30+ sec bilaterally to reduce risk for falls.    Time  2    Period  Weeks    Status  New    Target Date  03/04/19      PT SHORT TERM GOAL #2   Title  Pt will report 5/10 pain, moderate difficulty, and 2-3 rest breaks when completing household tasks to demo improved posture with functional mobility.    Time  2    Period  Weeks    Status  New        PT Long Term Goals - 02/18/19 1030      PT LONG TERM GOAL #1   Title  Pt will report compliance and be independent with HEP to maximize ROM and strength and minimize pain.    Time  4    Period  Weeks    Status  New    Target Date  03/18/19      PT LONG TERM GOAL #2   Title  Pt will improve BLE strength grade by 1 grade to demo improved posture, balance and QOL.    Time  4    Period  Weeks    Status  New      PT LONG TERM GOAL #3   Title  Pt will report 2/10 pain, mild to no difficulty, and 0 rest breaks when completing household tasks to demo improved posture with functional mobility.    Time  4    Period  Weeks    Status  New      PT LONG TERM GOAL #4   Title  Pt will have 10% improvement in FOTO score to demo improved self perceived functional disability and improvement in QOL.    Time  4    Period  Weeks    Status  New          Plan - 02/18/19 0908    Clinical Impression Statement  Pt is a pleasant 48YO male with primary complaint of low back pain for 15+ years. Pt also reports receiving thoracic outlet syndrome diagnosis in past and will further undergo testing in August 2020 to find out if it is due to nerve or vascular issues. Pt with  complaints of heaviness throughout BUE and BLE that limit his ability to perform tasks along with the chronic low back pain. Pt demonstrates rounded shoulders, forward head, increased thoracic kyphosis, decreased lumbar lordosis, posterior pelvic tilt posture in standing and sitting, but is able to move out of that into more neutral postural alignment when verbally cued. Pt with significant forward head/rounded shoulder posture preventing head and posterior shoulders from lying flat on assessment table in supine. Pt demonstrates deficits in BLE strength via MMT,  lumbar AROM, and decreased balance per SLS test. Pt with negative slump test bilaterally and reports previous sciatica pains bilaterally, but hasn't occurred in years. Pt denies tenderness to palpation around lumbar paraspinals, but reports that area is "always sore". Pt with hypomobility and stiffness throughout lumbar and thoracic spine when performing CPAs. Pt with "heaviness in extremities" complaints with activity possibly due to poor posture and muscular weakness for prolonged period. Pt would benefit from skilled PT interventions to improve strength, AROM, endurance with activities, gait, balance and reduce pain with functional mobility.    Personal Factors and Comorbidities  Comorbidity 1;Past/Current Experience;Time since onset of injury/illness/exacerbation    Examination-Activity Limitations  Bend;Carry;Lift    Examination-Participation Restrictions  Other;Cleaning   Work   Stability/Clinical Decision Making  Stable/Uncomplicated    Clinical Decision Making  Low    Rehab Potential  Fair    PT Frequency  2x / week    PT Duration  4 weeks    PT Treatment/Interventions  ADLs/Self Care Home Management;Aquatic Therapy;Biofeedback;Cryotherapy;Electrical Stimulation;Moist Heat;Traction;Ultrasound;Gait training;Stair training;Functional mobility training;Therapeutic activities;Therapeutic exercise;Balance training;Neuromuscular  re-education;Patient/family education;Orthotic Fit/Training;Manual techniques;Passive range of motion;Dry needling;Taping;Joint Manipulations    PT Next Visit Plan  Review goals, administer FOTO. Begin stretching and strengthening postural muscles and hips, stretch and AROM throughout cervical ROM. Monitor thoracic outlet syndrome discomfort with activities and adjust stretch/strengthening as needed.    PT Home Exercise Plan  Eval: pec stretch, upper trap stretch, cervical retraction (chin tucks) in sitting    Consulted and Agree with Plan of Care  Patient       Patient will benefit from skilled therapeutic intervention in order to improve the following deficits and impairments:  Decreased activity tolerance, Decreased balance, Decreased range of motion, Decreased strength, Hypomobility, Increased fascial restricitons, Increased muscle spasms, Impaired perceived functional ability, Impaired flexibility, Postural dysfunction, Pain  Visit Diagnosis: 1. Chronic bilateral low back pain without sciatica   2. Muscle weakness (generalized)   3. Other symptoms and signs involving the musculoskeletal system        Problem List Patient Active Problem List   Diagnosis Date Noted  . Varicose veins of both lower extremities with pain 08/22/2017  . Chronic venous insufficiency 08/22/2017     Talbot Grumbling PT, DPT  [place] [address] [place], [place], [address] Phone: [phone]   Fax:  [phone]  Name: Paul Sawyer MRN: [id number] Date of Birth: 1971-04-09

## 2019-02-22 ENCOUNTER — Ambulatory Visit (HOSPITAL_COMMUNITY): Payer: 59 | Admitting: Physical Therapy

## 2019-02-22 ENCOUNTER — Other Ambulatory Visit: Payer: Self-pay

## 2019-02-22 ENCOUNTER — Telehealth (HOSPITAL_COMMUNITY): Payer: Self-pay

## 2019-02-22 ENCOUNTER — Ambulatory Visit (HOSPITAL_COMMUNITY): Payer: 59

## 2019-02-22 DIAGNOSIS — G8929 Other chronic pain: Secondary | ICD-10-CM

## 2019-02-22 DIAGNOSIS — M545 Low back pain, unspecified: Secondary | ICD-10-CM

## 2019-02-22 DIAGNOSIS — R29898 Other symptoms and signs involving the musculoskeletal system: Secondary | ICD-10-CM

## 2019-02-22 DIAGNOSIS — M6281 Muscle weakness (generalized): Secondary | ICD-10-CM | POA: Diagnosis not present

## 2019-02-22 NOTE — Therapy (Signed)
[place], [place], [address] Phone: [phone]   Fax:  [phone]  Physical Therapy Treatment  Patient Details  Name: Paul Sawyer MRN: [id number] Date of Birth: 12-May-1971 Referring Provider (PT):  Phillips Odor, MD   Encounter Date: 02/22/2019  PT End of Session - 02/22/19 1620    Visit Number  2    Number of Visits  8    Date for PT Re-Evaluation  03/18/19    Authorization Type  [facility]    Authorization Time Period  02/18/19 to 03/18/19    PT Start Time  1537    PT Stop Time  1619    PT Time Calculation (min)  42 min    Activity Tolerance  Patient tolerated treatment well    Behavior During Therapy  [facility] for tasks assessed/performed       Past Medical History:  Diagnosis Date  . PAD (peripheral artery disease) ([place])   . Varicose veins of both lower extremities     Past Surgical History:  Procedure Laterality Date  . CHOLECYSTECTOMY  2004  . GASTRIC BYPASS  2005    There were no vitals filed for this visit.  Subjective Assessment - 02/22/19 1621    Subjective  Patient reported 2/10 pain this session stated he has been performing exercises at home.    Limitations  Lifting;Standing;Walking;House hold activities    How long can you sit comfortably?  no issues    How long can you stand comfortably?  30 minutes    How long can you walk comfortably?  1-2 miles, heaviness through BUE and BLE    Patient Stated Goals  Get back cramping to stop, improve overall strength of back  and posture    Currently in Pain?  Yes    Pain Score  2     Pain Location  Back    Pain Orientation  Lower    Pain Descriptors / Indicators  Throbbing    Pain Type  Chronic pain    Pain Onset  More than a month ago         [facility] PT Assessment - 02/22/19 0001      Observation/Other Assessments   Focus on Therapeutic Outcomes (FOTO)   53% limited                   [facility] Adult PT Treatment/Exercise -  02/22/19 0001      Exercises   Exercises  Lumbar      Lumbar Exercises: Stretches   Quad Stretch  Right;Left;3 reps;30 seconds    Quad Stretch Limitations  With a belt strap    Other Lumbar Stretch Exercise  Corner stretch x 20 seconds demonstrating understanding of HEP      Lumbar Exercises: Standing   Other Standing Lumbar Exercises  10 chin tucks 5'' holds      Lumbar Exercises: Seated   Other Seated Lumbar Exercises  Posterior shoulder rolls x 10 incorporating breathing    Other Seated Lumbar Exercises  Scapular retraction 5'' x 15             PT Education - 02/22/19 1621    Education Details  Reviewed evaluation, goals, HEP results of FOTO.    Person(s) Educated  Patient    Methods  Explanation    Comprehension  Verbalized understanding       PT Short Term Goals - 02/22/19 1540      PT SHORT  TERM GOAL #1   Title  Pt will improve SLS to 30+ sec bilaterally to reduce risk for falls.    Time  2    Period  Weeks    Status  On-going    Target Date  03/04/19      PT SHORT TERM GOAL #2   Title  Pt will report 5/10 pain, moderate difficulty, and 2-3 rest breaks when completing household tasks to demo improved posture with functional mobility.    Time  2    Period  Weeks    Status  On-going        PT Long Term Goals - 02/22/19 1541      PT LONG TERM GOAL #1   Title  Pt will report compliance and be independent with HEP to maximize ROM and strength and minimize pain.    Time  4    Period  Weeks    Status  On-going      PT LONG TERM GOAL #2   Title  Pt will improve BLE strength grade by 1 grade to demo improved posture, balance and QOL.    Time  4    Period  Weeks    Status  On-going      PT LONG TERM GOAL #3   Title  Pt will report 2/10 pain, mild to no difficulty, and 0 rest breaks when completing household tasks to demo improved posture with functional mobility.    Time  4    Period  Weeks    Status  On-going      PT LONG TERM GOAL #4   Title  Pt  will have 10% improvement in FOTO score to demo improved self perceived functional disability and improvement in QOL.    Time  4    Period  Weeks    Status  On-going            Plan - 02/22/19 1625    Clinical Impression Statement  Began session by reviewing patient's HEP and goals. Then progressed to patient performing exercises from HEP. Then educated patient on postural exercises including scapular retraction and posterior shoulder rolls. Also educated patient on stretches for hip flexors. Patient required intermittent cueing throughout for proper form. Patient is very eager to continue and make improvements in his posture.    Personal Factors and Comorbidities  Comorbidity 1;Past/Current Experience;Time since onset of injury/illness/exacerbation    Examination-Activity Limitations  Bend;Carry;Lift    Examination-Participation Restrictions  Other;Cleaning   Work   Stability/Clinical Decision Making  Stable/Uncomplicated    Rehab Potential  Fair    PT Frequency  2x / week    PT Duration  4 weeks    PT Treatment/Interventions  ADLs/Self Care Home Management;Aquatic Therapy;Biofeedback;Cryotherapy;Electrical Stimulation;Moist Heat;Traction;Ultrasound;Gait training;Stair training;Functional mobility training;Therapeutic activities;Therapeutic exercise;Balance training;Neuromuscular re-education;Patient/family education;Orthotic Fit/Training;Manual techniques;Passive range of motion;Dry needling;Taping;Joint Manipulations    PT Next Visit Plan  Begin postural strengthening with theraband next session as able. Continue stretching and strengthening postural muscles and hips, stretch and AROM throughout cervical ROM. Monitor thoracic outlet syndrome discomfort with activities and adjust stretch/strengthening as needed.    PT Home Exercise Plan  Eval: pec stretch, upper trap stretch, cervical retraction (chin tucks) in sitting    Consulted and Agree with Plan of Care  Patient       Patient  will benefit from skilled therapeutic intervention in order to improve the following deficits and impairments:  Decreased activity tolerance, Decreased balance, Decreased range of motion, Decreased strength, Hypomobility,  Increased fascial restricitons, Increased muscle spasms, Impaired perceived functional ability, Impaired flexibility, Postural dysfunction, Pain  Visit Diagnosis: 1. Chronic bilateral low back pain without sciatica   2. Muscle weakness (generalized)   3. Other symptoms and signs involving the musculoskeletal system        Problem List Patient Active Problem List   Diagnosis Date Noted  . Varicose veins of both lower extremities with pain 08/22/2017  . Chronic venous insufficiency 08/22/2017   Clarene Critchley PT, DPT 4:27 PM, 02/22/19 [place] [address] [place], [place], [address] Phone: [phone]   Fax:  [phone]  Name: Paul Sawyer MRN: [id number] Date of Birth: Jul 23, 1971

## 2019-02-22 NOTE — Telephone Encounter (Signed)
No show, called and spoke with pt concerning missed apt.  Pt confused with dates and time, thought apt was scheduled for tomorrow.  Reminded next apt date and time, contact information and educated no show policy with verbalized understanding.    [address] [place]; [facility] [phone]

## 2019-02-24 ENCOUNTER — Encounter (HOSPITAL_COMMUNITY): Payer: Self-pay | Admitting: Physical Therapy

## 2019-02-24 ENCOUNTER — Other Ambulatory Visit: Payer: Self-pay

## 2019-02-24 ENCOUNTER — Ambulatory Visit (HOSPITAL_COMMUNITY): Payer: 59 | Admitting: Physical Therapy

## 2019-02-24 DIAGNOSIS — G8929 Other chronic pain: Secondary | ICD-10-CM

## 2019-02-24 DIAGNOSIS — M6281 Muscle weakness (generalized): Secondary | ICD-10-CM | POA: Diagnosis not present

## 2019-02-24 DIAGNOSIS — R29898 Other symptoms and signs involving the musculoskeletal system: Secondary | ICD-10-CM

## 2019-02-24 DIAGNOSIS — M545 Low back pain, unspecified: Secondary | ICD-10-CM

## 2019-02-24 NOTE — Therapy (Signed)
[place], [place], [address] Phone: [phone]   Fax:  [phone]  Physical Therapy Treatment  Patient Details  Name: Paul Sawyer MRN: [id number] Date of Birth: 1971-04-24 Referring Provider (PT):  Phillips Odor, MD   Encounter Date: 02/24/2019  PT End of Session - 02/24/19 1551    Visit Number  3    Number of Visits  8    Date for PT Re-Evaluation  03/18/19    Authorization Type  [facility]    Authorization Time Period  02/18/19 to 03/18/19    PT Start Time  8588    PT Stop Time  1130    PT Time Calculation (min)  1375 min    Activity Tolerance  Patient tolerated treatment well    Behavior During Therapy  [facility] for tasks assessed/performed       Past Medical History:  Diagnosis Date  . PAD (peripheral artery disease) ([place])   . Varicose veins of both lower extremities     Past Surgical History:  Procedure Laterality Date  . CHOLECYSTECTOMY  2004  . GASTRIC BYPASS  2005    There were no vitals filed for this visit.  Subjective Assessment - 02/24/19 1547    Subjective  pt reports compliance with HEP.  Was questioning a way he could keep track of his kyphosis.  REports LB pain and request moist heat.    Currently in Pain?  Yes    Pain Score  2     Pain Location  Back    Pain Orientation  Lower    Pain Descriptors / Indicators  Spasm;Throbbing                       [facility] Adult PT Treatment/Exercise - 02/24/19 0001      Lumbar Exercises: Stretches   Active Hamstring Stretch  Right;Left;2 reps;30 seconds    Active Hamstring Stretch Limitations  with towel    Single Knee to Chest Stretch  Right;Left;3 reps;20 seconds    Hip Flexor Stretch  2 reps;30 seconds;Limitations    Hip Flexor Stretch Limitations  standing with 12" step    Prone on Elbows Stretch  Limitations    Prone on Elbows Stretch Limitations  3 minutes    Press Ups  5 reps;Limitations    Press Ups Limitations   with gentle OP from PTA at Lumbar region    Other Lumbar Stretch Exercise  Corner stretch x 20 seconds demonstrating understanding of HEP      Lumbar Exercises: Standing   Scapular Retraction  Both;10 reps;Theraband    Theraband Level (Scapular Retraction)  Level 3 (Green)    Row  Both;10 reps;Theraband    Theraband Level (Row)  Level 3 (Green)    Shoulder Extension  Both;10 reps;Theraband    Theraband Level (Shoulder Extension)  Level 3 (Green)    Other Standing Lumbar Exercises  UE slides facing wall 10 reps    Other Standing Lumbar Exercises  UE flexion against wall 10 reps   maintains until UE in 155 flexion     Lumbar Exercises: Seated   Other Seated Lumbar Exercises  Posterior shoulder rolls x 10 incorporating breathing    Other Seated Lumbar Exercises  Scapular retraction 5'' x 15      Modalities   Modalities  Moist Heat      Moist Heat Therapy   Number Minutes Moist Heat  10 Minutes  Moist Heat Location  Lumbar Spine   with supine LE stretches            PT Education - 02/24/19 1552    Education Details  general education on benefits of extension, postural education, questions regarding physioball chair to use at desk. updated HEP to include postural theraband strengthening.    Person(s) Educated  Patient    Methods  Explanation;Demonstration;Tactile cues;Verbal cues;Handout    Comprehension  Verbalized understanding;Returned demonstration;Verbal cues required;Tactile cues required       PT Short Term Goals - 02/22/19 1540      PT SHORT TERM GOAL #1   Title  Pt will improve SLS to 30+ sec bilaterally to reduce risk for falls.    Time  2    Period  Weeks    Status  On-going    Target Date  03/04/19      PT SHORT TERM GOAL #2   Title  Pt will report 5/10 pain, moderate difficulty, and 2-3 rest breaks when completing household tasks to demo improved posture with functional mobility.    Time  2    Period  Weeks    Status  On-going        PT Long Term  Goals - 02/22/19 1541      PT LONG TERM GOAL #1   Title  Pt will report compliance and be independent with HEP to maximize ROM and strength and minimize pain.    Time  4    Period  Weeks    Status  On-going      PT LONG TERM GOAL #2   Title  Pt will improve BLE strength grade by 1 grade to demo improved posture, balance and QOL.    Time  4    Period  Weeks    Status  On-going      PT LONG TERM GOAL #3   Title  Pt will report 2/10 pain, mild to no difficulty, and 0 rest breaks when completing household tasks to demo improved posture with functional mobility.    Time  4    Period  Weeks    Status  On-going      PT LONG TERM GOAL #4   Title  Pt will have 10% improvement in FOTO score to demo improved self perceived functional disability and improvement in QOL.    Time  4    Period  Weeks    Status  On-going            Plan - 02/24/19 1554    Clinical Impression Statement  continued with currentl therex with addition of postural strenghtening exercises, lumbar stabiliation and stretches for hamstring and gluteal mm.  Pt with questions regarding measures for kyphosis and general questions regarding postural modification for his desk; pt is purchasing physioball chair.  PT able to complete all exercises without pain, actually had postivive results with reduced pain completing extension exercises.  VC's for postural corrections with addtion of therex to keep in proper alignment.  Pt only able to maintain straight posturing until UE are in 155 degrees flexion.  Used moist heat at end of sessioh while pateint was completing lumbar/LE stretching with postive results.  Pt without questions or issues at end of session.    Personal Factors and Comorbidities  Comorbidity 1;Past/Current Experience;Time since onset of injury/illness/exacerbation    Examination-Activity Limitations  Bend;Carry;Lift    Examination-Participation Restrictions  Other;Cleaning   Work   Stability/Clinical Decision  Making  Stable/Uncomplicated  Rehab Potential  Fair    PT Frequency  2x / week    PT Duration  4 weeks    PT Treatment/Interventions  ADLs/Self Care Home Management;Aquatic Therapy;Biofeedback;Cryotherapy;Electrical Stimulation;Moist Heat;Traction;Ultrasound;Gait training;Stair training;Functional mobility training;Therapeutic activities;Therapeutic exercise;Balance training;Neuromuscular re-education;Patient/family education;Orthotic Fit/Training;Manual techniques;Passive range of motion;Dry needling;Taping;Joint Manipulations    PT Next Visit Plan  Continue stretching and strengthening postural muscles and hips, stretch and AROM throughout cervical ROM. Monitor thoracic outlet syndrome discomfort with activities and adjust stretch/strengthening as needed.    PT Home Exercise Plan  Eval: pec stretch, upper trap stretch, cervical retraction (chin tucks) in sitting  02/24/19:  postural theraband (retraction, rows, extension) GTB    Consulted and Agree with Plan of Care  Patient       Patient will benefit from skilled therapeutic intervention in order to improve the following deficits and impairments:  Decreased activity tolerance, Decreased balance, Decreased range of motion, Decreased strength, Hypomobility, Increased fascial restricitons, Increased muscle spasms, Impaired perceived functional ability, Impaired flexibility, Postural dysfunction, Pain  Visit Diagnosis: 1. Chronic bilateral low back pain without sciatica   2. Muscle weakness (generalized)   3. Other symptoms and signs involving the musculoskeletal system        Problem List Patient Active Problem List   Diagnosis Date Noted  . Varicose veins of both lower extremities with pain 08/22/2017  . Chronic venous insufficiency 08/22/2017   Teena Irani, PTA/CLT [phone]  Teena Irani 02/24/2019, 4:01 PM  [place] [address] [place], [place], [address] Phone:  [phone]   Fax:  [phone]  Name: Paul Sawyer MRN: [id number] Date of Birth: 25-Jan-1971

## 2019-02-25 MED FILL — BUPRENORPHIN-NALOXON 8-2 MG: 8-2 | 28 days supply | Qty: 28 | Fill #0

## 2019-03-01 ENCOUNTER — Ambulatory Visit (HOSPITAL_COMMUNITY): Payer: 59 | Admitting: Physical Therapy

## 2019-03-01 ENCOUNTER — Telehealth (HOSPITAL_COMMUNITY): Payer: Self-pay | Admitting: Physical Therapy

## 2019-03-01 NOTE — Telephone Encounter (Signed)
Called regarding patient not showing up for appointment today. Reminded patient of next scheduled visit and provided clinic phone number to call if needed.  Clarene Critchley PT, DPT 12:47 PM, 03/01/19 [phone]

## 2019-03-02 ENCOUNTER — Encounter (HOSPITAL_COMMUNITY): Payer: Self-pay | Admitting: Physical Therapy

## 2019-03-02 ENCOUNTER — Other Ambulatory Visit: Payer: Self-pay

## 2019-03-02 ENCOUNTER — Ambulatory Visit (HOSPITAL_COMMUNITY): Payer: 59 | Admitting: Physical Therapy

## 2019-03-02 DIAGNOSIS — G8929 Other chronic pain: Secondary | ICD-10-CM

## 2019-03-02 DIAGNOSIS — M545 Low back pain, unspecified: Secondary | ICD-10-CM

## 2019-03-02 DIAGNOSIS — R29898 Other symptoms and signs involving the musculoskeletal system: Secondary | ICD-10-CM

## 2019-03-02 DIAGNOSIS — M6281 Muscle weakness (generalized): Secondary | ICD-10-CM | POA: Diagnosis not present

## 2019-03-02 NOTE — Therapy (Signed)
[place], [place], [address] Phone: [phone]   Fax:  [phone]  Physical Therapy Treatment  Patient Details  Name: Paul Sawyer MRN: [id number] Date of Birth: Jul 22, 1971 Referring Provider (PT):  Phillips Odor, MD   Encounter Date: 03/02/2019  PT End of Session - 03/02/19 1736    Visit Number  4    Number of Visits  8    Date for PT Re-Evaluation  03/18/19    Authorization Type  [facility] [employer]    Authorization Time Period  02/18/19 to 03/18/19    PT Start Time  1640    PT Stop Time  1725    PT Time Calculation (min)  45 min    Activity Tolerance  Patient tolerated treatment well    Behavior During Therapy  [facility] for tasks assessed/performed       Past Medical History:  Diagnosis Date  . PAD (peripheral artery disease) ([place])   . Varicose veins of both lower extremities     Past Surgical History:  Procedure Laterality Date  . CHOLECYSTECTOMY  2004  . GASTRIC BYPASS  2005    There were no vitals filed for this visit.  Subjective Assessment - 03/02/19 1647    Subjective  pt states 6/10 pain in low back today, rough day he states.  Upper back is doing Okay.  Reports compliance with HEP.    Currently in Pain?  Yes    Pain Score  6     Pain Location  Back    Pain Orientation  Lower;Medial                       [facility] Adult PT Treatment/Exercise - 03/02/19 0001      Lumbar Exercises: Stretches   Single Knee to Chest Stretch  Right;Left;3 reps;20 seconds    Prone on Elbows Stretch  Limitations    Prone on Elbows Stretch Limitations  3 minutes    Press Ups  5 reps;Limitations    Press Ups Limitations  with gentle OP from PTA at Lumbar region    Piriformis Stretch  Right;Left;3 reps;30 seconds    Piriformis Stretch Limitations  instructed in seated and supin      Lumbar Exercises: Standing   Scapular Retraction  Both;Theraband;15 reps    Theraband Level (Scapular Retraction)  Level  3 (Green)    Row  Both;Theraband;15 reps    Theraband Level (Row)  Level 3 (Green)    Shoulder Extension  Both;Theraband;15 reps    Theraband Level (Shoulder Extension)  Level 3 (Green)    Other Standing Lumbar Exercises  UE slides facing wall then lift off 10 reps    Other Standing Lumbar Exercises  UE flexion against wall 10 reps      Lumbar Exercises: Seated   Other Seated Lumbar Exercises  Posterior shoulder rolls x 10 incorporating breathing      Modalities   Modalities  Moist Heat   with supine exercises     Moist Heat Therapy   Number Minutes Moist Heat  10 Minutes    Moist Heat Location  Lumbar Spine             PT Education - 03/02/19 1736    Education Details  answered questions regarding kyphosis; wondering if it could be reversed and corrected with exercises alone.       PT Short Term Goals - 02/22/19 1540  PT SHORT TERM GOAL #1   Title  Pt will improve SLS to 30+ sec bilaterally to reduce risk for falls.    Time  2    Period  Weeks    Status  On-going    Target Date  03/04/19      PT SHORT TERM GOAL #2   Title  Pt will report 5/10 pain, moderate difficulty, and 2-3 rest breaks when completing household tasks to demo improved posture with functional mobility.    Time  2    Period  Weeks    Status  On-going        PT Long Term Goals - 02/22/19 1541      PT LONG TERM GOAL #1   Title  Pt will report compliance and be independent with HEP to maximize ROM and strength and minimize pain.    Time  4    Period  Weeks    Status  On-going      PT LONG TERM GOAL #2   Title  Pt will improve BLE strength grade by 1 grade to demo improved posture, balance and QOL.    Time  4    Period  Weeks    Status  On-going      PT LONG TERM GOAL #3   Title  Pt will report 2/10 pain, mild to no difficulty, and 0 rest breaks when completing household tasks to demo improved posture with functional mobility.    Time  4    Period  Weeks    Status  On-going       PT LONG TERM GOAL #4   Title  Pt will have 10% improvement in FOTO score to demo improved self perceived functional disability and improvement in QOL.    Time  4    Period  Weeks    Status  On-going            Plan - 03/02/19 1737    Clinical Impression Statement  continued with focus on stretching and postural strengthening exercises.  Pt noted to have increased UE flexion today while keeping in alignment against wall.  Utilized moist heat again while completing supine stretches.  Also appears to have slight increase in lumbar extension with prone on elbows, maintaining hip to mat.   Instructed with pirifomis stretch in seated and supine position.  No new exercises given for HEP this session.    Personal Factors and Comorbidities  Comorbidity 1;Past/Current Experience;Time since onset of injury/illness/exacerbation    Examination-Activity Limitations  Bend;Carry;Lift    Examination-Participation Restrictions  Other;Cleaning   Work   Stability/Clinical Decision Making  Stable/Uncomplicated    Rehab Potential  Fair    PT Frequency  2x / week    PT Duration  4 weeks    PT Treatment/Interventions  ADLs/Self Care Home Management;Aquatic Therapy;Biofeedback;Cryotherapy;Electrical Stimulation;Moist Heat;Traction;Ultrasound;Gait training;Stair training;Functional mobility training;Therapeutic activities;Therapeutic exercise;Balance training;Neuromuscular re-education;Patient/family education;Orthotic Fit/Training;Manual techniques;Passive range of motion;Dry needling;Taping;Joint Manipulations    PT Next Visit Plan  Continue stretching and strengthening postural muscles and hips, stretch and AROM throughout cervical ROM. Monitor thoracic outlet syndrome discomfort with activities and adjust stretch/strengthening as needed.    PT Home Exercise Plan  Eval: pec stretch, upper trap stretch, cervical retraction (chin tucks) in sitting  02/24/19:  postural theraband (retraction, rows, extension) GTB     Consulted and Agree with Plan of Care  Patient       Patient will benefit from skilled therapeutic intervention in order to improve the following  deficits and impairments:  Decreased activity tolerance, Decreased balance, Decreased range of motion, Decreased strength, Hypomobility, Increased fascial restricitons, Increased muscle spasms, Impaired perceived functional ability, Impaired flexibility, Postural dysfunction, Pain  Visit Diagnosis: 1. Other symptoms and signs involving the musculoskeletal system   2. Chronic bilateral low back pain without sciatica   3. Muscle weakness (generalized)        Problem List Patient Active Problem List   Diagnosis Date Noted  . Varicose veins of both lower extremities with pain 08/22/2017  . Chronic venous insufficiency 08/22/2017   Teena Irani, PTA/CLT [phone]  Teena Irani 03/02/2019, 5:45 PM  [place] [address] [place], [place], [address] Phone: [phone]   Fax:  [phone]  Name: Paul Sawyer MRN: [id number] Date of Birth: 1970-12-09

## 2019-03-05 ENCOUNTER — Encounter

## 2019-03-05 DIAGNOSIS — I739 Peripheral vascular disease, unspecified: Secondary | ICD-10-CM | POA: Insufficient documentation

## 2019-03-08 MED FILL — CYCLOBENZAPRINE HCL 10 MG T: 10 | 30 days supply | Qty: 30 | Fill #3

## 2019-03-09 ENCOUNTER — Other Ambulatory Visit: Payer: Self-pay

## 2019-03-09 ENCOUNTER — Ambulatory Visit (HOSPITAL_COMMUNITY): Payer: 59

## 2019-03-09 ENCOUNTER — Encounter (HOSPITAL_COMMUNITY): Payer: Self-pay

## 2019-03-09 DIAGNOSIS — M545 Low back pain, unspecified: Secondary | ICD-10-CM

## 2019-03-09 DIAGNOSIS — M6281 Muscle weakness (generalized): Secondary | ICD-10-CM

## 2019-03-09 DIAGNOSIS — G8929 Other chronic pain: Secondary | ICD-10-CM

## 2019-03-09 DIAGNOSIS — R29898 Other symptoms and signs involving the musculoskeletal system: Secondary | ICD-10-CM

## 2019-03-09 NOTE — Therapy (Signed)
[place], [place], [address] Phone: [phone]   Fax:  [phone]  Physical Therapy Treatment  Patient Details  Name: Paul Sawyer MRN: [id number] Date of Birth: 11-27-1970 Referring Provider (PT):  Phillips Odor, MD   Encounter Date: 03/09/2019  PT End of Session - 03/09/19 1128    Visit Number  5    Number of Visits  8    Date for PT Re-Evaluation  03/18/19    Authorization Type  [facility]    Authorization Time Period  02/18/19 to 03/18/19    PT Start Time  1122    PT Stop Time  1205    PT Time Calculation (min)  43 min    Activity Tolerance  Patient tolerated treatment well    Behavior During Therapy  [facility] for tasks assessed/performed       Past Medical History:  Diagnosis Date  . PAD (peripheral artery disease) ([place])   . Varicose veins of both lower extremities     Past Surgical History:  Procedure Laterality Date  . CHOLECYSTECTOMY  2004  . GASTRIC BYPASS  2005    There were no vitals filed for this visit.  Subjective Assessment - 03/09/19 1122    Subjective  LBP 4/10 shooting pain that increases with certain movements (twist).    Patient Stated Goals  Get back cramping to stop, improve overall strength of back  and posture    Currently in Pain?  Yes    Pain Score  4     Pain Location  Back    Pain Orientation  Lower;Medial    Pain Descriptors / Indicators  Shooting;Throbbing;Spasm    Pain Type  Chronic pain    Pain Radiating Towards  none    Pain Onset  More than a month ago    Pain Frequency  Constant    Aggravating Factors   bending over    Pain Relieving Factors  pain medication, laying supine    Effect of Pain on Daily Activities  increased         [facility] PT Assessment - 03/09/19 0001      Assessment   Medical Diagnosis  LBP    Referring Provider (PT)   Phillips Odor, MD    Onset Date/Surgical Date  --   approximately 15 years ago   Next MD Visit  4-6 weeks    Prior  Therapy  yes for LBP, no results due to lack of consistency                   [facility] Adult PT Treatment/Exercise - 03/09/19 0001      Posture/Postural Control   Posture/Postural Control  Postural limitations    Postural Limitations  Rounded Shoulders;Forward head;Increased thoracic kyphosis;Decreased lumbar lordosis;Posterior pelvic tilt    Posture Comments  In supine, head and posterior shoulders unable to touch mat due to rounded shoulders/forward head      Lumbar Exercises: Stretches   Single Knee to Chest Stretch  Right;Left;3 reps;20 seconds    Prone on Elbows Stretch  Limitations    Prone on Elbows Stretch Limitations  3 minutes      Lumbar Exercises: Standing   Scapular Retraction  Both;Theraband;15 reps    Theraband Level (Scapular Retraction)  Level 3 (Green)    Row  Both;Theraband;15 reps    Theraband Level (Row)  Level 3 (Green)    Shoulder Extension  Both;Theraband;15 reps    Theraband  Level (Shoulder Extension)  Level 3 (Green)    Other Standing Lumbar Exercises  UE slides facing wall then lift off 10 reps    Other Standing Lumbar Exercises  UE flexion against wall 10 reps      Lumbar Exercises: Seated   Other Seated Lumbar Exercises  3D Thoracic excursion      Lumbar Exercises: Supine   Other Supine Lumbar Exercises  cervical retraction 10x 5"               PT Short Term Goals - 02/22/19 1540      PT SHORT TERM GOAL #1   Title  Pt will improve SLS to 30+ sec bilaterally to reduce risk for falls.    Time  2    Period  Weeks    Status  On-going    Target Date  03/04/19      PT SHORT TERM GOAL #2   Title  Pt will report 5/10 pain, moderate difficulty, and 2-3 rest breaks when completing household tasks to demo improved posture with functional mobility.    Time  2    Period  Weeks    Status  On-going        PT Long Term Goals - 02/22/19 1541      PT LONG TERM GOAL #1   Title  Pt will report compliance and be independent with HEP to  maximize ROM and strength and minimize pain.    Time  4    Period  Weeks    Status  On-going      PT LONG TERM GOAL #2   Title  Pt will improve BLE strength grade by 1 grade to demo improved posture, balance and QOL.    Time  4    Period  Weeks    Status  On-going      PT LONG TERM GOAL #3   Title  Pt will report 2/10 pain, mild to no difficulty, and 0 rest breaks when completing household tasks to demo improved posture with functional mobility.    Time  4    Period  Weeks    Status  On-going      PT LONG TERM GOAL #4   Title  Pt will have 10% improvement in FOTO score to demo improved self perceived functional disability and improvement in QOL.    Time  4    Period  Weeks    Status  On-going            Plan - 03/09/19 1220    Clinical Impression Statement  Continued session focus with stretching and postural strengthening exercises.  Added 3D thoracic excursion for spinal mobility, pt able to complete correct mechanics and given printout to add to HEP.  Cueing through session to improve awareness of posture and reduce forward head as well as abdominal activation to reduce lumbar lordosis.  Improved awareness with abilty to self correct without cueing.    Personal Factors and Comorbidities  Comorbidity 1;Past/Current Experience;Time since onset of injury/illness/exacerbation    Examination-Activity Limitations  Bend;Carry;Lift    Examination-Participation Restrictions  Other;Cleaning   Work   Stability/Clinical Decision Making  Stable/Uncomplicated    Clinical Decision Making  Low    Rehab Potential  Fair    PT Frequency  2x / week    PT Duration  4 weeks    PT Treatment/Interventions  ADLs/Self Care Home Management;Aquatic Therapy;Biofeedback;Cryotherapy;Electrical Stimulation;Moist Heat;Traction;Ultrasound;Gait training;Stair training;Functional mobility training;Therapeutic activities;Therapeutic exercise;Balance training;Neuromuscular re-education;Patient/family  education;Orthotic Fit/Training;Manual  techniques;Passive range of motion;Dry needling;Taping;Joint Manipulations    PT Next Visit Plan  Continue stretching and strengthening postural muscles and hips, stretch and AROM throughout cervical ROM. Monitor thoracic outlet syndrome discomfort with activities and adjust stretch/strengthening as needed.    PT Home Exercise Plan  Eval: pec stretch, upper trap stretch, cervical retraction (chin tucks) in sitting  02/24/19:  postural theraband (retraction, rows, extension) GTB; 7/28: 3D thoracic excursion       Patient will benefit from skilled therapeutic intervention in order to improve the following deficits and impairments:  Decreased activity tolerance, Decreased balance, Decreased range of motion, Decreased strength, Hypomobility, Increased fascial restricitons, Increased muscle spasms, Impaired perceived functional ability, Impaired flexibility, Postural dysfunction, Pain  Visit Diagnosis: 1. Chronic bilateral low back pain without sciatica   2. Muscle weakness (generalized)   3. Other symptoms and signs involving the musculoskeletal system        Problem List Patient Active Problem List   Diagnosis Date Noted  . Varicose veins of both lower extremities with pain 08/22/2017  . Chronic venous insufficiency 08/22/2017   Ihor Austin, LPTA; Louise  Aldona Lento 03/09/2019, 12:48 PM  [place] [address] [place], [place], [address] Phone: [phone]   Fax:  [phone]  Name: NATURE VOGELSANG MRN: [id number] Date of Birth: 06-02-1971

## 2019-03-11 ENCOUNTER — Ambulatory Visit (HOSPITAL_COMMUNITY): Payer: 59

## 2019-03-11 ENCOUNTER — Telehealth (HOSPITAL_COMMUNITY): Payer: Self-pay | Admitting: Internal Medicine

## 2019-03-11 NOTE — Telephone Encounter (Signed)
03/11/19  pt called in to cx said that his back was just hurting too bad this morning

## 2019-03-15 ENCOUNTER — Telehealth (HOSPITAL_COMMUNITY): Payer: Self-pay | Admitting: Internal Medicine

## 2019-03-15 ENCOUNTER — Ambulatory Visit (HOSPITAL_COMMUNITY): Payer: 59 | Admitting: Physical Therapy

## 2019-03-15 NOTE — Telephone Encounter (Signed)
03/15/19  pt left a message to cx has a work conflict.  He said to also cancel the appt for later in the week and he will call back to reschedule

## 2019-03-17 ENCOUNTER — Ambulatory Visit (HOSPITAL_COMMUNITY): Payer: 59

## 2019-03-19 DIAGNOSIS — G540 Brachial plexus disorders: Secondary | ICD-10-CM | POA: Diagnosis not present

## 2019-03-23 ENCOUNTER — Encounter (HOSPITAL_COMMUNITY): Payer: Self-pay

## 2019-03-23 ENCOUNTER — Other Ambulatory Visit: Payer: Self-pay

## 2019-03-23 ENCOUNTER — Ambulatory Visit (HOSPITAL_COMMUNITY): Payer: 59 | Attending: Neurology

## 2019-03-23 DIAGNOSIS — R293 Abnormal posture: Secondary | ICD-10-CM | POA: Diagnosis not present

## 2019-03-23 DIAGNOSIS — M6281 Muscle weakness (generalized): Secondary | ICD-10-CM | POA: Diagnosis not present

## 2019-03-23 DIAGNOSIS — M545 Low back pain, unspecified: Secondary | ICD-10-CM

## 2019-03-23 DIAGNOSIS — G8929 Other chronic pain: Secondary | ICD-10-CM | POA: Diagnosis not present

## 2019-03-23 DIAGNOSIS — R29898 Other symptoms and signs involving the musculoskeletal system: Secondary | ICD-10-CM | POA: Diagnosis not present

## 2019-03-23 DIAGNOSIS — M546 Pain in thoracic spine: Secondary | ICD-10-CM | POA: Diagnosis not present

## 2019-03-23 NOTE — Therapy (Addendum)
[facility] [address] [place], [place], [address] Phone: [phone]   Fax:  [phone]       Physical Therapy Treatment & Discharge Summary  Patient Details  Name: Paul Sawyer MRN: [id number] Date of Birth: 28-Nov-1970 Referring Provider (PT):  Phillips Odor, MD   Encounter Date: 03/23/2019  PHYSICAL THERAPY DISCHARGE SUMMARY  Visits from Start of Care: 6  Current functional level related to goals / functional outcomes: Patient evaluated and treated for LBP and now plans to discharge from this episode and begin new episode to evaluate/treat thoracic spine pain and concerns for TOS as he was diagnosed by a specialist. Details for this episode can be seen below and he is scheduled for evaluation on 03/25/19.   Remaining deficits: See below details   Education / Equipment: See below details and education as well as pt instructions for HEP handouts.  Plan: Patient agrees to discharge.  Patient goals were partially met. Patient is being discharged due to the patient's request. pt diagnosed with TOS and wishing to focus on this for new evaluation and treatment ?????   Kipp Brood, PT, DPT, [facility] Physical Therapist with [facility]    Progress Note Reporting Period 02/18/19 to 03/18/19  See note below for Objective Data and Assessment of Progress/Goals.   This therapist has reviewed the following information and agrees with plan to continue treating pt's LBP for 2x/week for 2 weeks to further improve strength, posture, reduce pain and improve overall QoL. Talbot Grumbling PT, DPT 03/24/19, 8:15 AM [phone]    PT End of Session - 03/23/19 1226    Visit Number  6    Number of Visits  12    Date for PT Re-Evaluation  04/07/19    Authorization Type  [facility]    Authorization Time Period  02/18/19 to 03/18/19; NEW: 03/23/19 to 04/02/19    PT Start Time  1136   pt late for apt   PT Stop Time  1215    PT Time  Calculation (min)  39 min    Activity Tolerance  Patient tolerated treatment well    Behavior During Therapy  [facility] for tasks assessed/performed       Past Medical History:  Diagnosis Date  . PAD (peripheral artery disease) ([place])   . Varicose veins of both lower extremities     Past Surgical History:  Procedure Laterality Date  . CHOLECYSTECTOMY  2004  . GASTRIC BYPASS  2005    There were no vitals filed for this visit.  Subjective Assessment - 03/23/19 1140    Subjective  Pt reports pain continues on center of lower back, pain scale 5/10.  Went to specialist last Friday and was diagnosed with Thoracic outlet syndrome.  Pt brought referral to begin PT.    How long can you sit comfortably?  no issues    How long can you stand comfortably?  around a hour; 30 minutes    How long can you walk comfortably?  At least 2 miles; 1-2 miles, heaviness through BUE and BLE    Patient Stated Goals  Get back cramping to stop, improve overall strength of back  and posture    Currently in Pain?  Yes    Pain Score  5     Pain Location  Back    Pain Orientation  Lower;Medial    Pain Descriptors / Indicators  Aching;Sharp    Pain Type  Chronic pain  Pain Onset  More than a month ago    Pain Frequency  Constant           PT Education - 03/23/19 1238    Education Details  Reviewed findings wiht MMT, FOTO and educated core sets with postural strengthening.  Pt brought order and protocol to begin thoracic outlet PT, educated on evaluation next session with PT.    Person(s) Educated  Patient    Methods  Explanation;Demonstration    Comprehension  Verbalized understanding;Need further instruction       PT Short Term Goals - 03/23/19 1144      PT SHORT TERM GOAL #1   Title  Pt will improve SLS to 30+ sec bilaterally to reduce risk for falls.    Baseline  8/11: 3 sets: Rt 11, 8, 48"; Lt 7, 13, 8"    Status  On-going      PT SHORT TERM GOAL #2   Title  Pt will report 5/10 pain, moderate  difficulty, and 2-3 rest breaks when completing household tasks to demo improved posture with functional mobility.    Baseline  8/11:  Reports pain scale average 3/10 while completing functional mobility, with 1 rest break    Status  Achieved        PT Long Term Goals - 03/23/19 1135      PT LONG TERM GOAL #1   Title  Pt will report compliance and be independent with HEP to maximize ROM and strength and minimize pain.    Baseline  03/23/19:  Reports compliance 3x a week.    Status  On-going      PT LONG TERM GOAL #2   Title  Pt will improve BLE strength grade by 1 grade to demo improved posture, balance and QOL.    Baseline  8/11: see MMT    Status  Partially Met      PT LONG TERM GOAL #3   Title  Pt will report 2/10 pain, mild to no difficulty, and 0 rest breaks when completing household tasks to demo improved posture with functional mobility.    Status  Not Met      PT LONG TERM GOAL #4   Title  Pt will have 10% improvement in FOTO score to demo improved self perceived functional disability and improvement in QOL.    Baseline  was 53% limited now 35%    Status  Achieved            Plan - 03/23/19 1230    Clinical Impression Statement  Pt arrived with order and protocol for thoracic outlet syndrome, discussed will be assessed next session with evaluation PT.  Today due to reassessment.  Reviewed goals with the following findings: Pt presents with improved strength BLE, improved QOL with improvements in FOTO score.  Continues to have limitation with balance, posture and pain in lower back though does reports improved tolerance with functional tasks at home.  Following HEP compliance pt stated he would like increased resistance and reviewed form wiht new blue theraband, pt with tendency to rest in anterior pelvic rotation educated on pelvic directions and use of core musculature to complete exercise with pelvic at neutral.    Personal Factors and Comorbidities  Comorbidity  1;Past/Current Experience;Time since onset of injury/illness/exacerbation    Examination-Activity Limitations  Bend;Carry;Lift    Examination-Participation Restrictions  Other;Cleaning   Work   Stability/Clinical Decision Making  Stable/Uncomplicated    Clinical Decision Making  Low    Rehab Potential    Fair    PT Frequency  2x / week    PT Duration  2 weeks    PT Treatment/Interventions  ADLs/Self Care Home Management;Aquatic Therapy;Biofeedback;Cryotherapy;Electrical Stimulation;Moist Heat;Traction;Ultrasound;Gait training;Stair training;Functional mobility training;Therapeutic activities;Therapeutic exercise;Balance training;Neuromuscular re-education;Patient/family education;Orthotic Fit/Training;Manual techniques;Passive range of motion;Dry needling;Taping;Joint Manipulations    PT Next Visit Plan  Continue POC 2x/week for 2 weeks. Discuss seated posture with work and review benefits with extra lumbar support.    PT Home Exercise Plan  Eval: pec stretch, upper trap stretch, cervical retraction (chin tucks) in sitting  02/24/19:  postural theraband (retraction, rows, extension) GTB; 7/28: 3D thoracic excursion; 8/11L BTB for posture strengtheing with core set    Consulted and Agree with Plan of Care  Patient       Patient will benefit from skilled therapeutic intervention in order to improve the following deficits and impairments:  Decreased activity tolerance, Decreased balance, Decreased range of motion, Decreased strength, Hypomobility, Increased fascial restricitons, Increased muscle spasms, Impaired perceived functional ability, Impaired flexibility, Postural dysfunction, Pain  Visit Diagnosis: 1. Chronic bilateral low back pain without sciatica   2. Muscle weakness (generalized)   3. Other symptoms and signs involving the musculoskeletal system        Problem List Patient Active Problem List   Diagnosis Date Noted  . Varicose veins of both lower extremities with pain 08/22/2017   . Chronic venous insufficiency 08/22/2017   Casey Cockerham, LPTA; CBIS [phone]  Victoria B Broussard 03/24/2019, 8:15 AM  [place] [facility] [address] [place], [place], [address] Phone: [phone]   Fax:  [phone]  Name: Paul Sawyer MRN: [id number] Date of Birth: 02/21/1971   

## 2019-03-24 NOTE — Addendum Note (Signed)
Addended by: Laneta Simmers B on: 03/24/2019 10:17 AM   Modules accepted: Orders

## 2019-03-25 ENCOUNTER — Encounter (HOSPITAL_COMMUNITY): Payer: Self-pay | Admitting: Physical Therapy

## 2019-03-25 ENCOUNTER — Ambulatory Visit (HOSPITAL_COMMUNITY): Payer: 59 | Admitting: Physical Therapy

## 2019-03-25 ENCOUNTER — Other Ambulatory Visit: Payer: Self-pay

## 2019-03-25 DIAGNOSIS — R29898 Other symptoms and signs involving the musculoskeletal system: Secondary | ICD-10-CM

## 2019-03-25 DIAGNOSIS — M6281 Muscle weakness (generalized): Secondary | ICD-10-CM | POA: Diagnosis not present

## 2019-03-25 DIAGNOSIS — R293 Abnormal posture: Secondary | ICD-10-CM

## 2019-03-25 DIAGNOSIS — M546 Pain in thoracic spine: Secondary | ICD-10-CM

## 2019-03-25 DIAGNOSIS — M545 Low back pain: Secondary | ICD-10-CM | POA: Diagnosis not present

## 2019-03-25 DIAGNOSIS — G8929 Other chronic pain: Secondary | ICD-10-CM | POA: Diagnosis not present

## 2019-03-25 NOTE — Therapy (Signed)
[place], [place], [address] Phone: [phone]   Fax:  [phone]  Physical Therapy Evaluation  Patient Details  Name: Paul Sawyer MRN: [id number] Date of Birth: May 05, 1971 Referring Provider (PT): Dr. Christena Deem    Encounter Date: 03/25/2019  PT End of Session - 03/25/19 1227    Visit Number  1    Number of Visits  8    Date for PT Re-Evaluation  04/22/19    Authorization Type  [employer] [facility]    Authorization Time Period  TOS cert: 02/04/93 to 8/54/62    Authorization - Visit Number  1    Authorization - Number of Visits  10    PT Start Time  7035    PT Stop Time  1200    PT Time Calculation (min)  38 min    Activity Tolerance  Patient tolerated treatment well    Behavior During Therapy  [facility] for tasks assessed/performed       Past Medical History:  Diagnosis Date  . PAD (peripheral artery disease) ([place])   . Varicose veins of both lower extremities     Past Surgical History:  Procedure Laterality Date  . CHOLECYSTECTOMY  2004  . GASTRIC BYPASS  2005    There were no vitals filed for this visit.   Subjective Assessment - 03/25/19 1126    Subjective  I am OK with discharging care for my back and switching to the thoracic outlet syndrome. I tend to hunch a lot anyway and I am now getting pain in my chest that feels muscular. Have numbness and tingling going into arms/hands, grip is fine.    How long can you sit comfortably?  none    How long can you stand comfortably?  back pain    How long can you walk comfortably?  back pain    Patient Stated Goals  Get back cramping to stop, improve overall strength of back  and posture    Currently in Pain?  Yes    Pain Score  3     Pain Location  Back    Pain Orientation  Lower;Medial    Pain Descriptors / Indicators  Aching;Sharp    Pain Type  Chronic pain         [facility] PT Assessment - 03/25/19 0001      Assessment   Medical Diagnosis  TOS     Referring Provider (PT)  Dr. Christena Deem     Onset Date/Surgical Date  --   a year ago    Next MD Visit  no set appointment     Prior Therapy  yes for LBP, no results due to lack of consistency      Precautions   Precautions  None      Restrictions   Weight Bearing Restrictions  No      Balance Screen   Has the patient fallen in the past 6 months  No    Has the patient had a decrease in activity level because of a fear of falling?   No    Is the patient reluctant to leave their home because of a fear of falling?   No      Prior Function   Level of Independence  Independent    Vocation  Full time employment    Vocation Requirements  IT/desk work     Leisure  hanging out with [employer]  Posture/Postural Control  Postural limitations    Postural Limitations  Rounded Shoulders;Forward head;Increased thoracic kyphosis    Posture Comments  demonstrates almost a hinging of neck, or lack of differentiation at vertebral levels with cervical flexion/extension       AROM   AROM Assessment Site  Shoulder;Cervical;Thoracic    Right/Left Shoulder  Right;Left    Right Shoulder Flexion  --   WNL    Right Shoulder ABduction  --   WNL    Right Shoulder Internal Rotation  --   L2   Right Shoulder External Rotation  --   T3    Left Shoulder Flexion  --   WNL    Left Shoulder ABduction  --   WNL    Left Shoulder Internal Rotation  --   L2    Left Shoulder External Rotation  --   T3   Cervical Flexion  60    Cervical Extension  60    Cervical - Right Side Bend  35    Cervical - Left Side Bend  47    Cervical - Right Rotation  80    Cervical - Left Rotation  80    Thoracic Flexion  WNL     Thoracic Extension  severe limitation    Thoracic - Right Side Bend  severe limitation     Thoracic - Left Side Bend  severe limitation     Thoracic - Right Rotation  severe limitation     Thoracic - Left Rotation  severe limitation       Strength   Strength  Assessment Site  Shoulder;Elbow;Wrist    Right/Left Shoulder  Right;Left    Right/Left Elbow  Right;Left    Right/Left Wrist  Right;Left      Palpation   Spinal mobility  thoracic spine extreme rigidity PAs, cervical spine PAs generally WNL                 Objective measurements completed on examination: See above findings.      [facility] Adult PT Treatment/Exercise - 03/25/19 0001      Exercises   Exercises  Neck      Neck Exercises: Standing   Other Standing Exercises  doorway stretch 1x30 seconds       Neck Exercises: Seated   Other Seated Exercise  scap retraction 1x10 5 second holds       Neck Exercises: Supine   Other Supine Exercise  thoracic extension stretch over 2 towel rolls x3 minutes              PT Education - 03/25/19 1226    Education Details  DC of low back and Eval of TOS due to this being more emergent condition and having 2 different doctors referring; prognosis and eval findings; POC, HEP; importance of compliance with PT/HEP in combination for optimal outcomes    Person(s) Educated  Patient    Methods  Explanation;Demonstration;Handout    Comprehension  Verbalized understanding;Returned demonstration       PT Short Term Goals - 03/25/19 1235      PT SHORT TERM GOAL #1   Title  Patient to be compliant with TOS oriented HEP, to be updated PRN    Time  1    Period  Weeks    Status  New    Target Date  04/01/19      PT SHORT TERM GOAL #2   Title  Patient to be internally aware of and able to self-correct postural  limitations on an independent basis at least 75% of the time in order to reduce pain and TOS related symptoms    Time  1    Period  Weeks    Status  New        PT Long Term Goals - 03/25/19 1238      PT LONG TERM GOAL #1   Title  Patient to demonstrate thoracic mobility as being only 50% impaired in order to allow him to more easily correct posture and reduce pain    Time  4    Period  Weeks    Status  New    Target  Date  04/22/19      PT LONG TERM GOAL #2   Title  Patient to report at least a 30% reduction in TOS related symptoms in order to show improvement of condition and to reduce pain    Time  4    Period  Weeks    Status  New      PT LONG TERM GOAL #3   Title  Patient to be able to physically demonstrate correct biomechanical set up of workstation in order to promote improved postural tendencies and reduce TOS symptoms    Time  4    Period  Weeks    Status  New      PT LONG TERM GOAL #4   Title  Patient to be able to maintain correct functional posture for at least 60 minutes without muscular fatigue in order to promote postural muscle strength and endurance    Time  4    Period  Weeks    Status  New             Plan - 03/25/19 1227    Clinical Impression Statement  Mr. Durfee arrives today reporting that his TOS symptoms are much more urgent to him, and that he would prefer to focus on this instead of his low back; agreeable to DC from lumbar POC and for evaluation of TOS today. Examination reveals significant postural impairments and impaired functional movement patterns of cervical and thoracic spines; note significant thoracic hypomobilty with rigid hyper-kyphotic curve likely having large impact on postural presentation and soft tissue limitations. Recommend trial of skilled PT services for TOS symptoms and postural impairments moving forward.    Personal Factors and Comorbidities  Comorbidity 1;Past/Current Experience;Time since onset of injury/illness/exacerbation    Examination-Activity Limitations  Bend;Carry;Lift    Examination-Participation Restrictions  Other;Cleaning    Stability/Clinical Decision Making  Stable/Uncomplicated    Clinical Decision Making  Low    Rehab Potential  Fair    PT Frequency  2x / week    PT Duration  4 weeks    PT Treatment/Interventions  ADLs/Self Care Home Management;Aquatic Therapy;Biofeedback;Cryotherapy;Electrical Stimulation;Moist  Heat;Traction;Ultrasound;Gait training;Stair training;Functional mobility training;Therapeutic activities;Therapeutic exercise;Balance training;Neuromuscular re-education;Patient/family education;Orthotic Fit/Training;Manual techniques;Passive range of motion;Dry needling;Taping;Joint Manipulations    PT Next Visit Plan  has been discharged for lumbar pain, now starting TOS POC. Thoracic PAs as tolerated, postural training and thoracic stretching.    PT Home Exercise Plan  For TOS: thoracic extension stretch over towel roll in supine, scap retractions, doorway stretch, lumbar roll    Consulted and Agree with Plan of Care  Patient       Patient will benefit from skilled therapeutic intervention in order to improve the following deficits and impairments:  Decreased activity tolerance, Decreased balance, Decreased range of motion, Decreased strength, Hypomobility, Increased fascial restricitons, Increased muscle spasms, Impaired  perceived functional ability, Impaired flexibility, Postural dysfunction, Pain  Visit Diagnosis: 1. Pain in thoracic spine   2. Abnormal posture   3. Other symptoms and signs involving the musculoskeletal system        Problem List Patient Active Problem List   Diagnosis Date Noted  . Varicose veins of both lower extremities with pain 08/22/2017  . Chronic venous insufficiency 08/22/2017    Deniece Ree PT, DPT, CBIS  Supplemental Physical Therapist [facility]    Pager [phone] Acute Rehab Office [place], [place], [address] Phone: [phone]   Fax:  [phone]  Name: Paul Sawyer MRN: [id number] Date of Birth: 1970-11-06

## 2019-03-25 NOTE — Therapy (Signed)
[place], [place], [address] Phone: [phone]   Fax:  [phone]  Patient Details  Name: Paul Sawyer MRN: [id number] Date of Birth: 1971-01-23 Referring Provider:  No ref. provider found  Encounter Date: 03/25/2019  PHYSICAL THERAPY DISCHARGE SUMMARY  Visits from Start of Care: 6  Current functional level related to goals / functional outcomes: Patient requesting for PT to treat thoracic outlet syndrome as he feels it is more urgent than his back. DC from back care.    Remaining deficits: Back pain, muscle weakness, postural deficits    Education / Equipment: DC from back to work on TOS Plan: Patient agrees to discharge.  Patient goals were partially met. Patient is being discharged due to the patient's request.  ?????       Deniece Ree PT, DPT, CBIS  Supplemental Physical Therapist [facility]    Pager [phone] Acute Rehab Office [place], [place], [address] Phone: [phone]   Fax:  [phone]

## 2019-03-25 NOTE — Patient Instructions (Signed)
   Thoracic Extension with foam roller  Lie on your back with foam roller/towel at mid rib cage level.Drop your shoulders back over the roller without your low back coming off the ground. Feel the stretch across your chest and in your back.    SCAPULAR RETRACTIONS  Draw your shoulder blades back and down. It should feel like you are squeezing your shoulder blades together.   Hold for 3 seconds.  Repeat 10-15 times, twice a day.     Chest-Shoulder ER stretch  Hold a solid trunk posture while engaging abdominals.  Push the trunk forward to stretch the front of the shoulders and chest.  Do not lead with your head or arch your back.  You should feel a stretch across your chest.  Hold for 30 seconds, then relax.  Repeat 2-3 times per day, twice a day.     LUMBAR ROLL  Use a small lumbar roll in chairs you sit at frequently. Place the roll at the lower curve of your back. This will help you maintain better posture.

## 2019-03-30 ENCOUNTER — Ambulatory Visit (HOSPITAL_COMMUNITY): Payer: 59

## 2019-04-01 ENCOUNTER — Other Ambulatory Visit: Payer: Self-pay

## 2019-04-01 ENCOUNTER — Encounter (HOSPITAL_COMMUNITY): Payer: Self-pay

## 2019-04-01 ENCOUNTER — Ambulatory Visit (HOSPITAL_COMMUNITY): Payer: 59

## 2019-04-01 DIAGNOSIS — M546 Pain in thoracic spine: Secondary | ICD-10-CM | POA: Diagnosis not present

## 2019-04-01 DIAGNOSIS — M6281 Muscle weakness (generalized): Secondary | ICD-10-CM | POA: Diagnosis not present

## 2019-04-01 DIAGNOSIS — M545 Low back pain: Secondary | ICD-10-CM | POA: Diagnosis not present

## 2019-04-01 DIAGNOSIS — G8929 Other chronic pain: Secondary | ICD-10-CM | POA: Diagnosis not present

## 2019-04-01 DIAGNOSIS — R293 Abnormal posture: Secondary | ICD-10-CM | POA: Diagnosis not present

## 2019-04-01 DIAGNOSIS — R29898 Other symptoms and signs involving the musculoskeletal system: Secondary | ICD-10-CM

## 2019-04-01 NOTE — Therapy (Signed)
[place], [place], [address] Phone: [phone]   Fax:  [phone]  Physical Therapy Treatment  Patient Details  Name: Paul Sawyer MRN: [id number] Date of Birth: 02/07/1971 Referring Provider (PT): Dr. Christena Deem    Encounter Date: 04/01/2019  PT End of Session - 04/01/19 1208    Visit Number  2    Number of Visits  8    Date for PT Re-Evaluation  04/22/19    Authorization Type  [employer]    Authorization Time Period  TOS cert: 09/08/76 to 6/76/72    Authorization - Visit Number  2    Authorization - Number of Visits  10    PT Start Time  0947   pt arrived late   PT Stop Time  1200    PT Time Calculation (min)  35 min    Activity Tolerance  Patient tolerated treatment well    Behavior During Therapy  [facility] for tasks assessed/performed       Past Medical History:  Diagnosis Date  . PAD (peripheral artery disease) ([place])   . Varicose veins of both lower extremities     Past Surgical History:  Procedure Laterality Date  . CHOLECYSTECTOMY  2004  . GASTRIC BYPASS  2005    There were no vitals filed for this visit.  Subjective Assessment - 04/01/19 1127    Subjective  Pt reports no pain after last session, performing HEP and got a massage table that he is able to perform exercises on at home.    Limitations  Lifting;Standing;Walking;House hold activities    How long can you sit comfortably?  none    How long can you stand comfortably?  back pain    How long can you walk comfortably?  back pain    Patient Stated Goals  Get back cramping to stop, improve overall strength of back  and posture    Currently in Pain?  Yes    Pain Score  3    normal pain   Pain Location  Back    Pain Orientation  Lower;Medial    Pain Descriptors / Indicators  Throbbing;Sharp    Pain Type  Chronic pain    Pain Radiating Towards  none    Pain Onset  More than a month ago    Pain Frequency  Constant    Aggravating  Factors   bending over    Pain Relieving Factors  pain medication, lying supine    Effect of Pain on Daily Activities  increased             [facility] Adult PT Treatment/Exercise - 04/01/19 0001      Neck Exercises: Seated   Other Seated Exercise  scap retraction, 15 reps with 5 sec hold      Neck Exercises: Supine   Neck Retraction  15 reps;3 secs    Other Supine Exercise  thoracic extension stretch, supine with 2 towel rolls, x3 minutes       Manual Therapy   Manual Therapy  Soft tissue mobilization;Manual Traction    Manual therapy comments  completed separate from rest of treatment    Soft tissue mobilization  STM to bil pec and suboccipitals to reduce palpable restrictions and pain    Manual Traction  gentle manual cervical traction 2x39min holds with good results      Neck Exercises: Stretches   Corner Stretch  3 reps;30 seconds   pec stretch  PT Education - 04/01/19 1209    Education Details  Tips for postural improvement (setting alarm on phone, mirror, getting up/repositioning every hour while working, temporary posture brace for tactile cues), exercise technique and updated HEP    Person(s) Educated  Patient    Methods  Explanation;Demonstration;Verbal cues    Comprehension  Verbalized understanding;Returned demonstration       PT Short Term Goals - 04/01/19 1129      PT SHORT TERM GOAL #1   Title  Patient to be compliant with TOS oriented HEP, to be updated PRN    Time  1    Period  Weeks    Status  On-going    Target Date  04/01/19      PT SHORT TERM GOAL #2   Title  Patient to be internally aware of and able to self-correct postural limitations on an independent basis at least 75% of the time in order to reduce pain and TOS related symptoms    Time  1    Period  Weeks    Status  On-going        PT Long Term Goals - 04/01/19 1129      PT LONG TERM GOAL #1   Title  Patient to demonstrate thoracic mobility as being only 50% impaired in  order to allow him to more easily correct posture and reduce pain    Time  4    Period  Weeks    Status  On-going      PT LONG TERM GOAL #2   Title  Patient to report at least a 30% reduction in TOS related symptoms in order to show improvement of condition and to reduce pain    Time  4    Period  Weeks    Status  On-going      PT LONG TERM GOAL #3   Title  Patient to be able to physically demonstrate correct biomechanical set up of workstation in order to promote improved postural tendencies and reduce TOS symptoms    Time  4    Period  Weeks    Status  On-going      PT LONG TERM GOAL #4   Title  Patient to be able to maintain correct functional posture for at least 60 minutes without muscular fatigue in order to promote postural muscle strength and endurance    Time  4    Period  Weeks    Status  On-going            Plan - 04/01/19 1211    Clinical Impression Statement  Continued with pt's established POC. Initiated treatment session with review of goals and HEP for exercise technique. Discontinue doorway stretch and instead perform in corner due to improved performance and reduced pain in BUE. Added supine chin retraction this date with isometric hold to strengthen cervical postural muscles and reduce forward head posture. Pt denies pain with ULTT bilaterally. Ended with STM to bil pecs and suboccipital muscles to reduce palpable restrictions limiting normal postural alignment and gentle cervical traction with pt reporting improvement in pain with traction. Treatment session limited due to pt's late arrival. Continue to progress as able.    Personal Factors and Comorbidities  Comorbidity 1;Past/Current Experience;Time since onset of injury/illness/exacerbation    Examination-Activity Limitations  Bend;Carry;Lift    Examination-Participation Restrictions  Other;Cleaning    Stability/Clinical Decision Making  Stable/Uncomplicated    Rehab Potential  Fair    PT Frequency  2x /  week    PT Duration  4 weeks    PT Treatment/Interventions  ADLs/Self Care Home Management;Aquatic Therapy;Biofeedback;Cryotherapy;Electrical Stimulation;Moist Heat;Traction;Ultrasound;Gait training;Stair training;Functional mobility training;Therapeutic activities;Therapeutic exercise;Balance training;Neuromuscular re-education;Patient/family education;Orthotic Fit/Training;Manual techniques;Passive range of motion;Dry needling;Taping;Joint Manipulations    PT Next Visit Plan  Continue postural strengthening and stretching. Thoracic PAs as tolerated. Manual STM and cervical traction as needed to reduce palpable restrictions.    PT Home Exercise Plan  For TOS: thoracic extension stretch over towel roll in supine, scap retractions, doorway stretch, lumbar roll; 8/20: discontinue doorway stretch and perform corner stretch instead, supine chin tucks    Consulted and Agree with Plan of Care  Patient       Patient will benefit from skilled therapeutic intervention in order to improve the following deficits and impairments:  Decreased activity tolerance, Decreased balance, Decreased range of motion, Decreased strength, Hypomobility, Increased fascial restricitons, Increased muscle spasms, Impaired perceived functional ability, Impaired flexibility, Postural dysfunction, Pain  Visit Diagnosis: Pain in thoracic spine  Abnormal posture  Other symptoms and signs involving the musculoskeletal system     Problem List Patient Active Problem List   Diagnosis Date Noted  . Varicose veins of both lower extremities with pain 08/22/2017  . Chronic venous insufficiency 08/22/2017     Talbot Grumbling PT, DPT 04/01/19, 12:13 PM [facility] [address] [place], [place], [address] Phone: [phone]   Fax:  [phone]  Name: Paul Sawyer MRN: [id number] Date of Birth: [birth year]

## 2019-04-03 MED FILL — BUPRENORPHIN-NALOXON 8-2 MG: 8-2 | 28 days supply | Qty: 28 | Fill #1

## 2019-04-05 DIAGNOSIS — M79669 Pain in unspecified lower leg: Secondary | ICD-10-CM | POA: Diagnosis not present

## 2019-04-05 DIAGNOSIS — G894 Chronic pain syndrome: Secondary | ICD-10-CM | POA: Diagnosis not present

## 2019-04-05 DIAGNOSIS — F1129 Opioid dependence with unspecified opioid-induced disorder: Secondary | ICD-10-CM | POA: Diagnosis not present

## 2019-04-05 DIAGNOSIS — M545 Low back pain: Secondary | ICD-10-CM | POA: Diagnosis not present

## 2019-04-06 ENCOUNTER — Telehealth (HOSPITAL_COMMUNITY): Payer: Self-pay

## 2019-04-06 ENCOUNTER — Encounter (HOSPITAL_COMMUNITY): Payer: Self-pay

## 2019-04-06 ENCOUNTER — Ambulatory Visit (HOSPITAL_COMMUNITY): Payer: 59

## 2019-04-06 ENCOUNTER — Other Ambulatory Visit: Payer: Self-pay

## 2019-04-06 DIAGNOSIS — R293 Abnormal posture: Secondary | ICD-10-CM

## 2019-04-06 DIAGNOSIS — G8929 Other chronic pain: Secondary | ICD-10-CM | POA: Diagnosis not present

## 2019-04-06 DIAGNOSIS — M545 Low back pain: Secondary | ICD-10-CM | POA: Diagnosis not present

## 2019-04-06 DIAGNOSIS — M546 Pain in thoracic spine: Secondary | ICD-10-CM | POA: Diagnosis not present

## 2019-04-06 DIAGNOSIS — R29898 Other symptoms and signs involving the musculoskeletal system: Secondary | ICD-10-CM

## 2019-04-06 DIAGNOSIS — M6281 Muscle weakness (generalized): Secondary | ICD-10-CM | POA: Diagnosis not present

## 2019-04-06 NOTE — Telephone Encounter (Signed)
Per front desk [place], pt forgot about 10:15 appointment and rescheduled to 11:15 today.  Tori Dajanae Brophy PT, DPT 04/06/19, 11:12 AM [phone]

## 2019-04-06 NOTE — Therapy (Signed)
[place], [place], [address] Phone: [phone]   Fax:  [phone]  Physical Therapy Treatment  Patient Details  Name: Paul Sawyer MRN: [id number] Date of Birth: 1971/03/10 Referring Provider (PT): Dr. Christena Deem    Encounter Date: 04/06/2019  PT End of Session - 04/06/19 1121    Visit Number  3    Number of Visits  8    Date for PT Re-Evaluation  04/22/19    Authorization Type  [employer]    Authorization Time Period  TOS cert: [id number] to [id number]    Authorization - Visit Number  3    Authorization - Number of Visits  10    PT Start Time  1120   pt arrived late   PT Stop Time  1200    PT Time Calculation (min)  40 min    Activity Tolerance  Patient tolerated treatment well    Behavior During Therapy  [facility] for tasks assessed/performed       Past Medical History:  Diagnosis Date  . PAD (peripheral artery disease) ([place])   . Varicose veins of both lower extremities     Past Surgical History:  Procedure Laterality Date  . CHOLECYSTECTOMY  2004  . GASTRIC BYPASS  2005    There were no vitals filed for this visit.  Subjective Assessment - 04/06/19 1125    Subjective  Pt reports today is a good day, no pain.    Limitations  Lifting;Standing;Walking;House hold activities    How long can you sit comfortably?  none    How long can you stand comfortably?  back pain    How long can you walk comfortably?  back pain    Patient Stated Goals  Get back cramping to stop, improve overall strength of back  and posture    Currently in Pain?  No/denies    Pain Onset  More than a month ago            [facility] Adult PT Treatment/Exercise - 04/06/19 0001      Neck Exercises: Standing   Other Standing Exercises  Y lift off, x10 reps      Neck Exercises: Supine   Other Supine Exercise  serratus punch, 2x10 reps B UE    Other Supine Exercise  thoracic extension stretch, half bolster, x3 minutes; pec  stretch, supine on bolster with BUE flexed to 90, 2x30 sec      Neck Exercises: Prone   Other Prone Exercise  prone on elbows, scapular protraction/retraction presses, 2x10 reps      Lumbar Exercises: Stretches   Lower Trunk Rotation Limitations  10x10 reps      Neck Exercises: Stretches   Corner Stretch  3 reps;30 seconds   pec stretch   Other Neck Stretches  QL on ball, hands up/down, 4x30 seconds    Other Neck Stretches  thoracic rotation, bolster between knees and dowel at chest, 10x10 sec hold             PT Education - 04/06/19 1126    Education Details  Exercise technique, updated HEP, expect soreness with increase in exercises    Person(s) Educated  Patient    Methods  Explanation;Demonstration;Handout    Comprehension  Verbalized understanding;Returned demonstration       PT Short Term Goals - 04/01/19 1129      PT SHORT TERM GOAL #1   Title  Patient to be compliant with TOS oriented HEP,  to be updated PRN    Time  1    Period  Weeks    Status  On-going    Target Date  04/01/19      PT SHORT TERM GOAL #2   Title  Patient to be internally aware of and able to self-correct postural limitations on an independent basis at least 75% of the time in order to reduce pain and TOS related symptoms    Time  1    Period  Weeks    Status  On-going        PT Long Term Goals - 04/01/19 1129      PT LONG TERM GOAL #1   Title  Patient to demonstrate thoracic mobility as being only 50% impaired in order to allow him to more easily correct posture and reduce pain    Time  4    Period  Weeks    Status  On-going      PT LONG TERM GOAL #2   Title  Patient to report at least a 30% reduction in TOS related symptoms in order to show improvement of condition and to reduce pain    Time  4    Period  Weeks    Status  On-going      PT LONG TERM GOAL #3   Title  Patient to be able to physically demonstrate correct biomechanical set up of workstation in order to promote  improved postural tendencies and reduce TOS symptoms    Time  4    Period  Weeks    Status  On-going      PT LONG TERM GOAL #4   Title  Patient to be able to maintain correct functional posture for at least 60 minutes without muscular fatigue in order to promote postural muscle strength and endurance    Time  4    Period  Weeks    Status  On-going            Plan - 04/06/19 1208    Clinical Impression Statement  Pt reports to therapy without pain this date. Progressed with postural strengthening requiring verbal cues for form with all exercises and fair technique. Pt able to perform serratus punches with good form in supine. Added prone on elbows with shoulder blade protraction/retraction with good form and fatigues with reps. Pt able to perform Y lift off for lower trap activation, fatiguing with reps and minimal complaints of neck discomfort with exercise. Alternated exercises with stretches to minimize pain, encourage muscle tension reduction and elongation and to improve awareness of postural muscles. Pt denies pain at EOS. Continue to progress as able.    Personal Factors and Comorbidities  Comorbidity 1;Past/Current Experience;Time since onset of injury/illness/exacerbation    Examination-Activity Limitations  Bend;Carry;Lift    Examination-Participation Restrictions  Other;Cleaning    Stability/Clinical Decision Making  Stable/Uncomplicated    Rehab Potential  Fair    PT Frequency  2x / week    PT Duration  4 weeks    PT Treatment/Interventions  ADLs/Self Care Home Management;Aquatic Therapy;Biofeedback;Cryotherapy;Electrical Stimulation;Moist Heat;Traction;Ultrasound;Gait training;Stair training;Functional mobility training;Therapeutic activities;Therapeutic exercise;Balance training;Neuromuscular re-education;Patient/family education;Orthotic Fit/Training;Manual techniques;Passive range of motion;Dry needling;Taping;Joint Manipulations    PT Next Visit Plan  Progress postural  strengthening and continue stretching throughout thoracic spine, QL and pecs. Manual STM and cervical traction as needed to reduce palpable restrictions.    PT Home Exercise Plan  For TOS: thoracic extension stretch over towel roll in supine, scap retractions, doorway stretch, lumbar roll;  8/20: discontinue doorway stretch and perform corner stretch instead, supine chin tucks; 8/25: supine serratus punch    Consulted and Agree with Plan of Care  Patient       Patient will benefit from skilled therapeutic intervention in order to improve the following deficits and impairments:  Decreased activity tolerance, Decreased balance, Decreased range of motion, Decreased strength, Hypomobility, Increased fascial restricitons, Increased muscle spasms, Impaired perceived functional ability, Impaired flexibility, Postural dysfunction, Pain  Visit Diagnosis: Pain in thoracic spine  Abnormal posture  Other symptoms and signs involving the musculoskeletal system     Problem List Patient Active Problem List   Diagnosis Date Noted  . Varicose veins of both lower extremities with pain 08/22/2017  . Chronic venous insufficiency 08/22/2017     Talbot Grumbling PT, DPT 04/06/19, 12:10 PM [place] [address] [place], [place], [address] Phone: [phone]   Fax:  [phone]  Name: Paul Sawyer MRN: [id number] Date of Birth: [birth year]

## 2019-04-08 ENCOUNTER — Encounter (HOSPITAL_COMMUNITY): Payer: Self-pay | Admitting: Physical Therapy

## 2019-04-08 ENCOUNTER — Ambulatory Visit (HOSPITAL_COMMUNITY): Payer: 59 | Admitting: Physical Therapy

## 2019-04-08 ENCOUNTER — Other Ambulatory Visit: Payer: Self-pay

## 2019-04-08 DIAGNOSIS — R293 Abnormal posture: Secondary | ICD-10-CM

## 2019-04-08 DIAGNOSIS — M6281 Muscle weakness (generalized): Secondary | ICD-10-CM | POA: Diagnosis not present

## 2019-04-08 DIAGNOSIS — M545 Low back pain: Secondary | ICD-10-CM | POA: Diagnosis not present

## 2019-04-08 DIAGNOSIS — G8929 Other chronic pain: Secondary | ICD-10-CM | POA: Diagnosis not present

## 2019-04-08 DIAGNOSIS — M546 Pain in thoracic spine: Secondary | ICD-10-CM

## 2019-04-08 DIAGNOSIS — R29898 Other symptoms and signs involving the musculoskeletal system: Secondary | ICD-10-CM

## 2019-04-08 NOTE — Therapy (Signed)
[place], [place], [address] Phone: [phone]   Fax:  [phone]  Physical Therapy Treatment  Patient Details  Name: Paul Sawyer MRN: [id number] Date of Birth: 07-19-1971 Referring Provider (PT): Dr. Christena Deem    Encounter Date: 04/08/2019  PT End of Session - 04/08/19 1406    Visit Number  4    Number of Visits  8    Date for PT Re-Evaluation  04/22/19    Authorization Type  [employer]    Authorization Time Period  TOS cert: [id number] to [id number]    Authorization - Visit Number  4    Authorization - Number of Visits  10    PT Start Time  1320    PT Stop Time  1400    PT Time Calculation (min)  40 min    Activity Tolerance  Patient tolerated treatment well    Behavior During Therapy  [facility] for tasks assessed/performed       Past Medical History:  Diagnosis Date  . PAD (peripheral artery disease) ([place])   . Varicose veins of both lower extremities     Past Surgical History:  Procedure Laterality Date  . CHOLECYSTECTOMY  2004  . GASTRIC BYPASS  2005    There were no vitals filed for this visit.  Subjective Assessment - 04/08/19 1323    Subjective  I'm doing better, I'm noticing the more I use my muscles and work on my posture the better I get. Not noticing any difference in the neurological symptoms in my arms.    Currently in Pain?  Yes    Pain Score  3     Pain Location  Back    Pain Orientation  Lower    Pain Descriptors / Indicators  Throbbing;Spasm    Pain Type  Chronic pain                       [facility] Adult PT Treatment/Exercise - 04/08/19 0001      Neck Exercises: Seated   Neck Retraction  15 reps;3 secs    Cervical Rotation  Both;15 reps    Cervical Rotation Limitations  3 second holds     Other Seated Exercise  thoracic 3D excursions 1x10       Neck Exercises: Supine   Neck Retraction  --    Cervical Rotation  --    Cervical Rotation Limitations  --    Other Supine Exercise  thoracic extenions over half bolster with UE overhead movement for increased stretch 1x10 10 second holds       Neck Exercises: Prone   Other Prone Exercise  cat/cow 1x10; thoracic reach through 1x10 B     Other Prone Exercise  prone I/T/Y 1x15 2#  for I and T, 0# for Y       Neck Exercises: Stretches   Corner Stretch  3 reps;30 seconds    Other Neck Stretches  triceps stretch 3x30 seconds              PT Education - 04/08/19 1406    Education Details  exercise technique, HEP updates, exercise purpose    Person(s) Educated  Patient    Methods  Explanation;Demonstration;Handout    Comprehension  Verbalized understanding;Returned demonstration       PT Short Term Goals - 04/01/19 1129      PT SHORT TERM GOAL #1   Title  Patient to be compliant with  TOS oriented HEP, to be updated PRN    Time  1    Period  Weeks    Status  On-going    Target Date  04/01/19      PT SHORT TERM GOAL #2   Title  Patient to be internally aware of and able to self-correct postural limitations on an independent basis at least 75% of the time in order to reduce pain and TOS related symptoms    Time  1    Period  Weeks    Status  On-going        PT Long Term Goals - 04/01/19 1129      PT LONG TERM GOAL #1   Title  Patient to demonstrate thoracic mobility as being only 50% impaired in order to allow him to more easily correct posture and reduce pain    Time  4    Period  Weeks    Status  On-going      PT LONG TERM GOAL #2   Title  Patient to report at least a 30% reduction in TOS related symptoms in order to show improvement of condition and to reduce pain    Time  4    Period  Weeks    Status  On-going      PT LONG TERM GOAL #3   Title  Patient to be able to physically demonstrate correct biomechanical set up of workstation in order to promote improved postural tendencies and reduce TOS symptoms    Time  4    Period  Weeks    Status  On-going      PT LONG  TERM GOAL #4   Title  Patient to be able to maintain correct functional posture for at least 60 minutes without muscular fatigue in order to promote postural muscle strength and endurance    Time  4    Period  Weeks    Status  On-going            Plan - 04/08/19 1407    Clinical Impression Statement  Continued working on thoracic mobility and postural strengthening today; patient with improved motivation today and reports he has purchased a massage table that encourages him to do his exercises daily. Demonstrates improved range of motion, able to progress difficulty of exercises today as well. Continued to discuss importance of regular attendance to PT and performance of HEP. Introduced prone I/T/Y for postural strengthening, had particular difficulty with Y likely due to ongoing kyphosis of thoracic spine.    Personal Factors and Comorbidities  Comorbidity 1;Past/Current Experience;Time since onset of injury/illness/exacerbation    Examination-Activity Limitations  Bend;Carry;Lift    Examination-Participation Restrictions  Other;Cleaning    Stability/Clinical Decision Making  Stable/Uncomplicated    Clinical Decision Making  Low    Rehab Potential  Fair    PT Frequency  2x / week    PT Duration  4 weeks    PT Treatment/Interventions  ADLs/Self Care Home Management;Aquatic Therapy;Biofeedback;Cryotherapy;Electrical Stimulation;Moist Heat;Traction;Ultrasound;Gait training;Stair training;Functional mobility training;Therapeutic activities;Therapeutic exercise;Balance training;Neuromuscular re-education;Patient/family education;Orthotic Fit/Training;Manual techniques;Passive range of motion;Dry needling;Taping;Joint Manipulations    PT Next Visit Plan  Progress postural strengthening and continue stretching throughout thoracic spine, QL and pecs. Manual STM and cervical traction as needed to reduce palpable restrictions.    PT Home Exercise Plan  For TOS: thoracic extension stretch over towel  roll in supine, scap retractions, doorway stretch, lumbar roll; 8/20: discontinue doorway stretch and perform corner stretch instead, supine chin tucks; 8/25: supine  serratus punch 8/27- triceps stretch, chin tuck with rotation    Consulted and Agree with Plan of Care  Patient       Patient will benefit from skilled therapeutic intervention in order to improve the following deficits and impairments:  Decreased activity tolerance, Decreased balance, Decreased range of motion, Decreased strength, Hypomobility, Increased fascial restricitons, Increased muscle spasms, Impaired perceived functional ability, Impaired flexibility, Postural dysfunction, Pain  Visit Diagnosis: Pain in thoracic spine  Abnormal posture  Other symptoms and signs involving the musculoskeletal system     Problem List Patient Active Problem List   Diagnosis Date Noted  . Varicose veins of both lower extremities with pain 08/22/2017  . Chronic venous insufficiency 08/22/2017    Deniece Ree PT, DPT, CBIS  Supplemental Physical Therapist [facility]    Pager [phone] Acute Rehab Office [place], [place], [address] Phone: [phone]   Fax:  [phone]  Name: Paul Sawyer MRN: [id number] Date of Birth: [birth year]

## 2019-04-08 NOTE — Patient Instructions (Addendum)
   TRICEP STRETCH  With your affected elbow bent and shoulder raised, use your other hand and gentley push your affected elbow back towards over head until a stretch is felt.  Hold for 30 seconds, then relax.   Repeat 3 times each side, 1-2 times daily.     CHIN TUCK WITH ROTATION  Slowly draw your head back so that your ears line up with your shoulders.  While holding the chin tuck, slowly turn your head to the right and then to the left so that you feel a stretch.  Repeat 10 times each side, 2-3 times a day.

## 2019-04-09 MED FILL — CYCLOBENZAPRINE HCL 10 MG T: 10 | 30 days supply | Qty: 30 | Fill #0

## 2019-04-13 ENCOUNTER — Other Ambulatory Visit: Payer: Self-pay

## 2019-04-13 ENCOUNTER — Encounter (HOSPITAL_COMMUNITY): Payer: Self-pay

## 2019-04-13 ENCOUNTER — Ambulatory Visit (HOSPITAL_COMMUNITY): Payer: 59 | Attending: Neurology

## 2019-04-13 DIAGNOSIS — M546 Pain in thoracic spine: Secondary | ICD-10-CM | POA: Insufficient documentation

## 2019-04-13 DIAGNOSIS — R29898 Other symptoms and signs involving the musculoskeletal system: Secondary | ICD-10-CM | POA: Insufficient documentation

## 2019-04-13 DIAGNOSIS — R293 Abnormal posture: Secondary | ICD-10-CM | POA: Insufficient documentation

## 2019-04-13 NOTE — Therapy (Signed)
[place], [place], [address] Phone: [phone]   Fax:  [phone]  Physical Therapy Treatment  Patient Details  Name: Paul Sawyer MRN: [id number] Date of Birth: Dec 24, 1970 Referring Provider (PT): Dr. Christena Deem    Encounter Date: 04/13/2019  PT End of Session - 04/13/19 1309    Visit Number  5    Number of Visits  8    Date for PT Re-Evaluation  04/22/19    Authorization Type  [employer]    Authorization Time Period  TOS cert: [id number]    Authorization - Visit Number  5    Authorization - Number of Visits  10    PT Start Time  [id number]   pt arrived late   PT Stop Time  1340    PT Time Calculation (min)  33 min    Activity Tolerance  Patient tolerated treatment well    Behavior During Therapy  [facility] for tasks assessed/performed       Past Medical History:  Diagnosis Date  . PAD (peripheral artery disease) ([place])   . Varicose veins of both lower extremities     Past Surgical History:  Procedure Laterality Date  . CHOLECYSTECTOMY  2004  . GASTRIC BYPASS  2005    There were no vitals filed for this visit.  Subjective Assessment - 04/13/19 1345    Subjective  Pt reports feeling good. Pt reports he wasn't as compliant with HEP this weekend as normal.    How long can you sit comfortably?  none    How long can you stand comfortably?  back pain    How long can you walk comfortably?  back pain    Patient Stated Goals  Get back cramping to stop, improve overall strength of back  and posture    Currently in Pain?  No/denies          [facility] Adult PT Treatment/Exercise - 04/13/19 0001      Neck Exercises: Standing   Other Standing Exercises  Y lift off, x10 reps    Other Standing Exercises  wall angels, 10 reps      Neck Exercises: Seated   Neck Retraction  15 reps;3 secs      Neck Exercises: Prone   Other Prone Exercise  prone on elbows, scapular protraction/retraction presses, 2x15  reps    Other Prone Exercise  Prone I, T, Y with 2#, 15 reps      Lumbar Exercises: Standing   Row  Both;15 reps    Theraband Level (Row)  Level 3 (Green)    Shoulder Extension  Both;15 reps    Theraband Level (Shoulder Extension)  Level 3 (Green)      Neck Exercises: Land  3 reps;30 seconds    Other Neck Stretches  thoracic rotation, bolster between knees and dowel at chest, 10x10 sec hold           PT Short Term Goals - 04/01/19 1129      PT SHORT TERM GOAL #1   Title  Patient to be compliant with TOS oriented HEP, to be updated PRN    Time  1    Period  Weeks    Status  On-going    Target Date  04/01/19      PT SHORT TERM GOAL #2   Title  Patient to be internally aware of and able to self-correct postural limitations on an independent basis  at least 75% of the time in order to reduce pain and TOS related symptoms    Time  1    Period  Weeks    Status  On-going        PT Long Term Goals - 04/01/19 1129      PT LONG TERM GOAL #1   Title  Patient to demonstrate thoracic mobility as being only 50% impaired in order to allow him to more easily correct posture and reduce pain    Time  4    Period  Weeks    Status  On-going      PT LONG TERM GOAL #2   Title  Patient to report at least a 30% reduction in TOS related symptoms in order to show improvement of condition and to reduce pain    Time  4    Period  Weeks    Status  On-going      PT LONG TERM GOAL #3   Title  Patient to be able to physically demonstrate correct biomechanical set up of workstation in order to promote improved postural tendencies and reduce TOS symptoms    Time  4    Period  Weeks    Status  On-going      PT LONG TERM GOAL #4   Title  Patient to be able to maintain correct functional posture for at least 60 minutes without muscular fatigue in order to promote postural muscle strength and endurance    Time  4    Period  Weeks    Status  On-going             Plan - 04/13/19 1309    Clinical Impression Statement  Pt able to complete prone UE exercises with intermittent verbal cues for technique. Pt continues to demo good carryover from each session requiring occasional verbal cues for form. Added wall angels and resisted shoulder extension and rows for added postural training. Pt unable to achieve upright posture with head and arms on wall with wall angels due to anterior tightness. Pt reports discomfort with wall angels and demonstrates fatigue with exercises requiring therapeutic rest breaks between exercises. Continue to progress as able.    Personal Factors and Comorbidities  Comorbidity 1;Past/Current Experience;Time since onset of injury/illness/exacerbation    Examination-Activity Limitations  Bend;Carry;Lift    Examination-Participation Restrictions  Other;Cleaning    Stability/Clinical Decision Making  Stable/Uncomplicated    Rehab Potential  Fair    PT Frequency  2x / week    PT Duration  4 weeks    PT Treatment/Interventions  ADLs/Self Care Home Management;Aquatic Therapy;Biofeedback;Cryotherapy;Electrical Stimulation;Moist Heat;Traction;Ultrasound;Gait training;Stair training;Functional mobility training;Therapeutic activities;Therapeutic exercise;Balance training;Neuromuscular re-education;Patient/family education;Orthotic Fit/Training;Manual techniques;Passive range of motion;Dry needling;Taping;Joint Manipulations    PT Next Visit Plan  Continue postural strengthening and stretching throughout thoracic spine, QL and pecs. Manual STM and cervical traction as needed to reduce palpable restrictions.    PT Home Exercise Plan  For TOS: thoracic extension stretch over towel roll in supine, scap retractions, doorway stretch, lumbar roll; 8/20: discontinue doorway stretch and perform corner stretch instead, supine chin tucks; 8/25: supine serratus punch 8/27- triceps stretch, chin tuck with rotation    Consulted and Agree with Plan of  Care  Patient       Patient will benefit from skilled therapeutic intervention in order to improve the following deficits and impairments:  Decreased activity tolerance, Decreased balance, Decreased range of motion, Decreased strength, Hypomobility, Increased fascial restricitons, Increased muscle spasms, Impaired perceived functional  ability, Impaired flexibility, Postural dysfunction, Pain  Visit Diagnosis: Pain in thoracic spine  Abnormal posture  Other symptoms and signs involving the musculoskeletal system     Problem List Patient Active Problem List   Diagnosis Date Noted  . Varicose veins of both lower extremities with pain 08/22/2017  . Chronic venous insufficiency 08/22/2017     Talbot Grumbling PT, DPT 04/13/19, 1:46 PM [place] [address] [place], [place], [address] Phone: [phone]   Fax:  [phone]  Name: Paul Sawyer MRN: [id number] Date of Birth: 25-Jul-1971

## 2019-04-15 ENCOUNTER — Other Ambulatory Visit: Payer: Self-pay

## 2019-04-15 ENCOUNTER — Ambulatory Visit (HOSPITAL_COMMUNITY): Payer: 59

## 2019-04-15 DIAGNOSIS — M546 Pain in thoracic spine: Secondary | ICD-10-CM | POA: Diagnosis not present

## 2019-04-15 DIAGNOSIS — R293 Abnormal posture: Secondary | ICD-10-CM

## 2019-04-15 DIAGNOSIS — R29898 Other symptoms and signs involving the musculoskeletal system: Secondary | ICD-10-CM | POA: Diagnosis not present

## 2019-04-15 NOTE — Patient Instructions (Signed)
      Continuous motion - 4x6 - towel roll under forehead do your neck is relaxed    use arms flat on wall- focus is to keep the arms straight up and down and press into the wall like your punch exercises. 4x5

## 2019-04-15 NOTE — Therapy (Signed)
[place], [place], [address] Phone: [phone]   Fax:  [phone]  Physical Therapy Treatment  Patient Details  Name: Paul Sawyer MRN: [id number] Date of Birth: 1971/01/25 Referring Provider (PT): Dr. Christena Deem    Encounter Date: 04/15/2019  PT End of Session - 04/15/19 1139    Visit Number  6    Number of Visits  8    Date for PT Re-Evaluation  04/22/19    Authorization Type  Paul Sawyer [employer]    Authorization Time Period  TOS cert: [id number] to [id number]    Authorization - Visit Number  6    Authorization - Number of Visits  10    PT Start Time  [id number]   patient late to session   PT Stop Time  1130    PT Time Calculation (min)  32 min    Activity Tolerance  Patient tolerated treatment well    Behavior During Therapy  [facility] for tasks assessed/performed       Past Medical History:  Diagnosis Date  . PAD (peripheral artery disease) ([place])   . Varicose veins of both lower extremities     Past Surgical History:  Procedure Laterality Date  . CHOLECYSTECTOMY  2004  . GASTRIC BYPASS  2005    There were no vitals filed for this visit.  Subjective Assessment - 04/15/19 1057    Subjective  States he has been doing his exercises. Reports he took a pain pill today so that might be masking his symptoms. Reports it feels about the same as usual. Numbness is also the same.    How long can you sit comfortably?  none    How long can you stand comfortably?  back pain    How long can you walk comfortably?  back pain    Patient Stated Goals  Get back cramping to stop, improve overall strength of back  and posture    Currently in Pain?  Yes    Pain Score  --   no numerical number provided on this date   Pain Location  Back    Pain Orientation  Right;Left;Anterior;Posterior    Pain Type  Chronic pain           [facility] Adult PT Treatment/Exercise - 04/15/19 0001      Neck Exercises: Standing   Other Standing  Exercises  serratus anterior wall slides 4x5       Neck Exercises: Prone   Other Prone Exercise  blackburns (I's T's Y's with ROM -abd) 4x6, 4" holds     Other Prone Exercise  --      Manual Therapy   Manual Therapy  Joint mobilization;Soft tissue mobilization;Neural Stretch    Manual therapy comments  completed separate from rest of treatment    Joint Mobilization  R shoulder AP grade III - 6 bouts of 30 seconds, progressed with PROM shoulder abd 3-4 bouts of 15-30 sec at different ranges of motion    Soft tissue mobilization  to right pec     Neural Stretch  R median nerve flossing 2x5, 5-10 second holds          PT Education - 04/15/19 1136    Education Details  exercise technique, HEP updates and frequency.    Person(s) Educated  Patient    Methods  Explanation;Demonstration;Handout       PT Short Term Goals - 04/01/19 1129      PT SHORT TERM GOAL #1  Title  Patient to be compliant with TOS oriented HEP, to be updated PRN    Time  1    Period  Weeks    Status  On-going    Target Date  04/01/19      PT SHORT TERM GOAL #2   Title  Patient to be internally aware of and able to self-correct postural limitations on an independent basis at least 75% of the time in order to reduce pain and TOS related symptoms    Time  1    Period  Weeks    Status  On-going        PT Long Term Goals - 04/01/19 1129      PT LONG TERM GOAL #1   Title  Patient to demonstrate thoracic mobility as being only 50% impaired in order to allow him to more easily correct posture and reduce pain    Time  4    Period  Weeks    Status  On-going      PT LONG TERM GOAL #2   Title  Patient to report at least a 30% reduction in TOS related symptoms in order to show improvement of condition and to reduce pain    Time  4    Period  Weeks    Status  On-going      PT LONG TERM GOAL #3   Title  Patient to be able to physically demonstrate correct biomechanical set up of workstation in order to promote  improved postural tendencies and reduce TOS symptoms    Time  4    Period  Weeks    Status  On-going      PT LONG TERM GOAL #4   Title  Patient to be able to maintain correct functional posture for at least 60 minutes without muscular fatigue in order to promote postural muscle strength and endurance    Time  4    Period  Weeks    Status  On-going            Plan - 04/15/19 1138    Clinical Impression Statement  Patient tolerated AP mobilization of right shoulder well and reported he felt his shoulder "eased" up afterwards. Continued symptoms reported throughout session, but patient reported no increase in symptoms and a "good workout feeling" afterwards. Focused on posterior chair strengthening with functional shoulder ROM which patient tolerated very well. Patient will continue to benefit from skilled physical therapy focusing on continued posterior chain strengthening, posture education and symptom management.    Personal Factors and Comorbidities  Comorbidity 1;Past/Current Experience;Time since onset of injury/illness/exacerbation    Examination-Activity Limitations  Bend;Carry;Lift    Examination-Participation Restrictions  Other;Cleaning    Stability/Clinical Decision Making  Stable/Uncomplicated    Rehab Potential  Fair    PT Frequency  2x / week    PT Duration  4 weeks    PT Treatment/Interventions  ADLs/Self Care Home Management;Aquatic Therapy;Biofeedback;Cryotherapy;Electrical Stimulation;Moist Heat;Traction;Ultrasound;Gait training;Stair training;Functional mobility training;Therapeutic activities;Therapeutic exercise;Balance training;Neuromuscular re-education;Patient/family education;Orthotic Fit/Training;Manual techniques;Passive range of motion;Dry needling;Taping;Joint Manipulations    PT Next Visit Plan  continue postural strengthening/stretching of t-spine. Tolerated right shoulder AP well - continue as needed.    PT Home Exercise Plan  For TOS: thoracic extension  stretch over towel roll in supine, scap retractions, doorway stretch, lumbar roll; 8/20: discontinue doorway stretch and perform corner stretch instead, supine chin tucks; 8/25: supine serratus punch 8/27- triceps stretch, chin tuck with rotation, 9/3: black burn and wall slides added  Consulted and Agree with Plan of Care  Patient       Patient will benefit from skilled therapeutic intervention in order to improve the following deficits and impairments:  Decreased activity tolerance, Decreased balance, Decreased range of motion, Decreased strength, Hypomobility, Increased fascial restricitons, Increased muscle spasms, Impaired perceived functional ability, Impaired flexibility, Postural dysfunction, Pain  Visit Diagnosis: Pain in thoracic spine  Abnormal posture     Problem List Patient Active Problem List   Diagnosis Date Noted  . Varicose veins of both lower extremities with pain 08/22/2017  . Chronic venous insufficiency 08/22/2017   11:45 AM, 04/15/19 Jerene Pitch, DPT Physical Therapy with [facility]  [phone] office  [place] [address] [place], [place], [address] Phone: [phone]   Fax:  [phone]  Name: Paul Sawyer MRN: [id number] Date of Birth: 04-Sep-1970

## 2019-04-20 ENCOUNTER — Encounter (HOSPITAL_COMMUNITY): Payer: Self-pay

## 2019-04-20 ENCOUNTER — Other Ambulatory Visit: Payer: Self-pay

## 2019-04-20 ENCOUNTER — Ambulatory Visit (HOSPITAL_COMMUNITY): Payer: 59

## 2019-04-20 DIAGNOSIS — M546 Pain in thoracic spine: Secondary | ICD-10-CM | POA: Diagnosis not present

## 2019-04-20 DIAGNOSIS — R293 Abnormal posture: Secondary | ICD-10-CM | POA: Diagnosis not present

## 2019-04-20 DIAGNOSIS — R29898 Other symptoms and signs involving the musculoskeletal system: Secondary | ICD-10-CM | POA: Diagnosis not present

## 2019-04-20 NOTE — Therapy (Signed)
[place], [place], [address] Phone: [phone]   Fax:  [phone]  Physical Therapy Treatment  Patient Details  Name: Paul Sawyer MRN: [id number] Date of Birth: Sep 03, 1970 Referring Provider (PT): Dr. Christena Deem    Encounter Date: 04/20/2019  PT End of Session - 04/20/19 1129    Visit Number  7    Number of Visits  8    Date for PT Re-Evaluation  04/22/19    Authorization Type  [employer]    Authorization Time Period  TOS cert: [id number]    Authorization - Visit Number  7    Authorization - Number of Visits  10    PT Start Time  1120    PT Stop Time  1200    PT Time Calculation (min)  40 min    Activity Tolerance  Patient tolerated treatment well    Behavior During Therapy  [facility] for tasks assessed/performed       Past Medical History:  Diagnosis Date  . PAD (peripheral artery disease) ([place])   . Varicose veins of both lower extremities     Past Surgical History:  Procedure Laterality Date  . CHOLECYSTECTOMY  2004  . GASTRIC BYPASS  2005    There were no vitals filed for this visit.  Subjective Assessment - 04/20/19 1124    Subjective  Pt reports he has been cutting back on pain medication and has been having an increase in pain. Pt reports that last session manual therapy helped a lot with pain relief.    Limitations  Lifting;Standing;Walking;House hold activities    How long can you sit comfortably?  none    How long can you stand comfortably?  back pain    How long can you walk comfortably?  back pain    Patient Stated Goals  Get back cramping to stop, improve overall strength of back  and posture    Currently in Pain?  No/denies            [facility] Adult PT Treatment/Exercise - 04/20/19 0001      Neck Exercises: Standing   Other Standing Exercises  wall angels, 3x5 reps      Neck Exercises: Prone   Other Prone Exercise  blackburns (I's T's Y's with ROM -abd) 10 reps    Other Prone Exercise  prone on elbows, scapular protraction/retraction presses, 2x15 reps      Lumbar Exercises: Standing   Row  Both;15 reps    Theraband Level (Row)  Level 4 (Blue)    Shoulder Extension  Both;15 reps    Theraband Level (Shoulder Extension)  Level 4 (Blue)      Lumbar Exercises: Quadruped   [place]  10 reps      Manual Therapy   Manual Therapy  Joint mobilization;Soft tissue mobilization;Neural Stretch    Manual therapy comments  completed separate from rest of treatment    Joint Mobilization  R shoulder AP grade II - 4 x 30 seconds at variety ranges of abduction    Soft tissue mobilization  to right pec     Neural Stretch  R median nerve flossing, x10 reps             PT Education - 04/20/19 1129    Education Details  Exercise technique, continue HEP    Person(s) Educated  Patient    Methods  Explanation    Comprehension  Verbalized understanding  PT Short Term Goals - 04/01/19 1129      PT SHORT TERM GOAL #1   Title  Patient to be compliant with TOS oriented HEP, to be updated PRN    Time  1    Period  Weeks    Status  On-going    Target Date  04/01/19      PT SHORT TERM GOAL #2   Title  Patient to be internally aware of and able to self-correct postural limitations on an independent basis at least 75% of the time in order to reduce pain and TOS related symptoms    Time  1    Period  Weeks    Status  On-going        PT Long Term Goals - 04/01/19 1129      PT LONG TERM GOAL #1   Title  Patient to demonstrate thoracic mobility as being only 50% impaired in order to allow him to more easily correct posture and reduce pain    Time  4    Period  Weeks    Status  On-going      PT LONG TERM GOAL #2   Title  Patient to report at least a 30% reduction in TOS related symptoms in order to show improvement of condition and to reduce pain    Time  4    Period  Weeks    Status  On-going      PT LONG TERM GOAL #3   Title  Patient  to be able to physically demonstrate correct biomechanical set up of workstation in order to promote improved postural tendencies and reduce TOS symptoms    Time  4    Period  Weeks    Status  On-going      PT LONG TERM GOAL #4   Title  Patient to be able to maintain correct functional posture for at least 60 minutes without muscular fatigue in order to promote postural muscle strength and endurance    Time  4    Period  Weeks    Status  On-going            Plan - 04/20/19 1215    Clinical Impression Statement  Pt with positive results from manual therapy with improvement of pain and available ROM. Pt requires verbal cues with rows and lat pull down/shoulder extension for increasing scapular retraction with exercises to decrease shoulder pain and compensations. Pt continues to have increased difficulty with wall angels and prone blackburns requiring therapeutic rest breaks between shortened sets. Pt educated on importance of exercise performance and consistency to maintain gains and further improve posture and reduce pain. Discussed reassessment next session to determine progress and continuation of care if needed. Continue to progress as able.    Personal Factors and Comorbidities  Comorbidity 1;Past/Current Experience;Time since onset of injury/illness/exacerbation    Examination-Activity Limitations  Bend;Carry;Lift    Examination-Participation Restrictions  Other;Cleaning    Stability/Clinical Decision Making  Stable/Uncomplicated    Rehab Potential  Fair    PT Frequency  2x / week    PT Duration  4 weeks    PT Treatment/Interventions  ADLs/Self Care Home Management;Aquatic Therapy;Biofeedback;Cryotherapy;Electrical Stimulation;Moist Heat;Traction;Ultrasound;Gait training;Stair training;Functional mobility training;Therapeutic activities;Therapeutic exercise;Balance training;Neuromuscular re-education;Patient/family education;Orthotic Fit/Training;Manual techniques;Passive range of  motion;Dry needling;Taping;Joint Manipulations    PT Next Visit Plan  Reassessment next session, discuss d/c vs. POC extension    PT Home Exercise Plan  For TOS: thoracic extension stretch over towel roll in supine,  scap retractions, doorway stretch, lumbar roll; 8/20: discontinue doorway stretch and perform corner stretch instead, supine chin tucks; 8/25: supine serratus punch 8/27- triceps stretch, chin tuck with rotation, 9/3: black burn and wall slides added    Consulted and Agree with Plan of Care  Patient       Patient will benefit from skilled therapeutic intervention in order to improve the following deficits and impairments:  Decreased activity tolerance, Decreased balance, Decreased range of motion, Decreased strength, Hypomobility, Increased fascial restricitons, Increased muscle spasms, Impaired perceived functional ability, Impaired flexibility, Postural dysfunction, Pain  Visit Diagnosis: Pain in thoracic spine  Abnormal posture  Other symptoms and signs involving the musculoskeletal system     Problem List Patient Active Problem List   Diagnosis Date Noted  . Varicose veins of both lower extremities with pain 08/22/2017  . Chronic venous insufficiency 08/22/2017     Talbot Grumbling PT, DPT 04/20/19, 12:21 PM [place], [place], [address] Phone: [phone]   Fax:  [phone]  Name: ZEID RUOFF MRN: [id number] Date of Birth: 1971-07-03

## 2019-04-22 ENCOUNTER — Telehealth (HOSPITAL_COMMUNITY): Payer: Self-pay | Admitting: Internal Medicine

## 2019-04-22 ENCOUNTER — Ambulatory Visit (HOSPITAL_COMMUNITY): Payer: 59

## 2019-04-22 NOTE — Telephone Encounter (Signed)
04/22/19  pt called to cx and we rescheduled the appt.  he said that he was in so much pain he couldn't move

## 2019-04-28 ENCOUNTER — Telehealth (HOSPITAL_COMMUNITY): Payer: Self-pay | Admitting: Internal Medicine

## 2019-04-28 ENCOUNTER — Other Ambulatory Visit: Payer: Self-pay

## 2019-04-28 DIAGNOSIS — R208 Other disturbances of skin sensation: Secondary | ICD-10-CM

## 2019-04-28 DIAGNOSIS — I83813 Varicose veins of bilateral lower extremities with pain: Secondary | ICD-10-CM

## 2019-04-28 NOTE — Telephone Encounter (Signed)
04/28/19  pt called to reschedule he has another appt around this same time

## 2019-04-29 ENCOUNTER — Ambulatory Visit (HOSPITAL_COMMUNITY)
Admission: RE | Admit: 2019-04-29 | Discharge: 2019-04-29 | Disposition: A | Discharge status: Home / Self Care | Payer: 59 | Source: Ambulatory Visit | Attending: Family | Admitting: Family

## 2019-04-29 ENCOUNTER — Ambulatory Visit (HOSPITAL_COMMUNITY): Payer: 59

## 2019-04-29 ENCOUNTER — Ambulatory Visit (INDEPENDENT_AMBULATORY_CARE_PROVIDER_SITE_OTHER): Payer: 59 | Admitting: Vascular Surgery

## 2019-04-29 ENCOUNTER — Encounter: Payer: Self-pay | Admitting: Vascular Surgery

## 2019-04-29 ENCOUNTER — Other Ambulatory Visit: Payer: Self-pay

## 2019-04-29 VITALS — BP 114/82 | HR 67 | Temp 97.0°F | Resp 16 | Ht 76.5 in | Wt 208.6 lb

## 2019-04-29 DIAGNOSIS — R208 Other disturbances of skin sensation: Secondary | ICD-10-CM | POA: Diagnosis not present

## 2019-04-29 DIAGNOSIS — I83813 Varicose veins of bilateral lower extremities with pain: Secondary | ICD-10-CM | POA: Insufficient documentation

## 2019-04-29 DIAGNOSIS — I872 Venous insufficiency (chronic) (peripheral): Secondary | ICD-10-CM

## 2019-04-29 NOTE — Progress Notes (Signed)
Patient name: Paul Sawyer MRN: [id number] DOB: 1970/10/20 Sex: male  REASON FOR VISIT:   Burning in feet.  HPI:   Paul Sawyer is a pleasant 48 y.o. male who I have previously seen with bilateral upper extremity weakness and aching pain.  He had no evidence of venous disease in either upper extremity by duplex scan.  He had no arm swelling.  I felt that he could potentially have neurogenic thoracic outlet syndrome.  There was no evidence of arterial insufficiency.  I felt that if his symptoms progressed the next logical step would be consultation with neurology.  If this were not helpful then he could potentially see Dr. Nicola Girt at [facility] who has experience with thoracic outlet syndrome and has the resources to address neurogenic thoracic outlet syndrome if this were a potential cause.  I was to see him back as needed.  His chief complaint today is burning pain in his feet.  He does also describe some achiness and heaviness associated with prolonged sitting and standing.  Has had no previous history of DVT.  He does have a history of back pain.  He is unaware of any history of neuropathy.  He does not have diabetes.  Current Outpatient Medications  Medication Sig Dispense Refill  . Buprenorphine HCl-Naloxone HCl 8-2 MG FILM   0  . cyclobenzaprine (FLEXERIL) 10 MG tablet   0   No current facility-administered medications for this visit.     REVIEW OF SYSTEMS:  [X]  denotes positive finding, [ ]  denotes negative finding Vascular    Leg swelling    Cardiac    Chest pain or chest pressure:    Shortness of breath upon exertion:    Short of breath when lying flat:    Irregular heart rhythm:    Constitutional    Fever or chills:     PHYSICAL EXAM:   Vitals:   04/29/19 1521  BP: 114/82  Pulse: 67  Resp: 16  Temp: (!) 97 F (36.1 C)  TempSrc: Temporal  SpO2: 100%  Weight: 208 lb 9.6 oz (94.6 kg)  Height: 6' 4.5" (1.943 m)    GENERAL: The patient is a well-nourished  male, in no acute distress. The vital signs are documented above. CARDIOVASCULAR: There is a regular rate and rhythm. PULMONARY: There is good air exchange bilaterally without wheezing or rales. VASCULAR: I do not detect carotid bruits. He has palpable femoral and popliteal pulses bilaterally.  He has biphasic posterior tibial signals bilaterally and biphasic anterior tibial signals. He has some spider veins bilaterally in both legs.  He has no significant hyperpigmentation or swelling currently.  DATA:   VENOUS DUPLEX: I have independently interpreted his venous duplex scan today.  On the right side there is no evidence of DVT.  There is no deep venous reflux or superficial venous reflux.  On the left side there is no evidence of DVT.  There is deep venous reflux involving the common femoral vein.  There is superficial venous reflux in the great saphenous vein in the thigh and in the calf.  The vein is not especially dilated.  MEDICAL ISSUES:   CHRONIC VENOUS INSUFFICIENCY: The patient does have some superficial venous reflux involving the left great saphenous vein only but the vein is not especially dilated.  There is no evidence of DVT.  He does have some deep venous reflux on the left involving the common femoral vein.  On the right side there is no evidence of  superficial or deep venous reflux no evidence of DVT.  I do not think that his small spider veins are contributing significantly to the symptoms that he describes.  We have discussed the importance of intermittent leg elevation and the proper positioning for this.  I encouraged him to wear his compression stockings during the day when he is on his feet.  We discussed the importance of exercise specifically walking and water aerobics.  I encouraged him to avoid prolonged sitting and standing.  I will be happy to see him back at any time if his symptoms progress.  A total of 25 minutes was spent on this visit. 15 minutes was face to face  time. More than 50% of the time was spent on counseling and coordinating with the patient.    [name] [facility] [phone]

## 2019-05-04 ENCOUNTER — Ambulatory Visit (HOSPITAL_COMMUNITY): Payer: 59

## 2019-05-04 ENCOUNTER — Other Ambulatory Visit: Payer: Self-pay

## 2019-05-04 ENCOUNTER — Encounter (HOSPITAL_COMMUNITY): Payer: Self-pay

## 2019-05-04 DIAGNOSIS — M546 Pain in thoracic spine: Secondary | ICD-10-CM | POA: Diagnosis not present

## 2019-05-04 DIAGNOSIS — R29898 Other symptoms and signs involving the musculoskeletal system: Secondary | ICD-10-CM

## 2019-05-04 DIAGNOSIS — R293 Abnormal posture: Secondary | ICD-10-CM

## 2019-05-04 NOTE — Therapy (Signed)
[place], [place], [address] Phone: [phone]   Fax:  [phone]   Progress Note Reporting Period 03/25/19 to 05/04/19  See note below for Objective Data and Assessment of Progress/Goals.       Physical Therapy Treatment  Patient Details  Name: Paul Sawyer MRN: [id number] Date of Birth: 08-24-1970 Referring Provider (PT): Dr. Christena Deem    Encounter Date: 05/04/2019  PT End of Session - 05/04/19 1045    Visit Number  8    Number of Visits  9    Date for PT Re-Evaluation  04/22/19    Authorization Type  [employer]    Authorization Time Period  TOS cert: 01/05/77 to 2/42/35; NEW: 05/04/19 to 06/04/19 (4 week HEP POC)    Authorization - Visit Number  8    Authorization - Number of Visits  10    PT Start Time  3614    PT Stop Time  1111    PT Time Calculation (min)  33 min    Activity Tolerance  Patient tolerated treatment well    Behavior During Therapy  [facility] for tasks assessed/performed       Past Medical History:  Diagnosis Date  . PAD (peripheral artery disease) ([place])   . Varicose veins of both lower extremities     Past Surgical History:  Procedure Laterality Date  . CHOLECYSTECTOMY  2004  . GASTRIC BYPASS  2005    There were no vitals filed for this visit.  Subjective Assessment - 05/04/19 1040    Subjective  Pt reports low back pain increase with spasms and cramping. Pt reports 50% compliance with HEP. Pt reports he notices when he doesn't do his exercises, the pain gets worse.    Limitations  Lifting;Standing;Walking;House hold activities    How long can you sit comfortably?  none    How long can you stand comfortably?  back pain    How long can you walk comfortably?  back pain    Patient Stated Goals  Get back cramping to stop, improve overall strength of back and posture    Currently in Pain?  Yes    Pain Score  5     Pain Location  Back    Pain Orientation  Lower    Pain  Descriptors / Indicators  Spasm;Cramping    Pain Type  Chronic pain    Pain Onset  More than a month ago    Pain Frequency  Constant    Aggravating Factors   bending over    Pain Relieving Factors  pain medication, lying supine    Effect of Pain on Daily Activities  increased         [facility] PT Assessment - 05/04/19 0001      Assessment   Medical Diagnosis  TOS     Referring Provider (PT)  Dr. Christena Deem     Onset Date/Surgical Date  --   a year ago    Next MD Visit  no set appointment     Prior Therapy  yes for LBP, no results due to lack of consistency      Prior Function   Level of Independence  Independent    Vocation  Full time employment    Vocation Requirements  IT/desk work     Leisure  hanging out with [employer]       Posture/Postural Control   Posture/Postural Control  Postural limitations    Postural Limitations  Rounded Shoulders;Forward head;Increased thoracic kyphosis      AROM   Right Shoulder Flexion  --   WNL   Right Shoulder ABduction  --   WNL   Right Shoulder Internal Rotation  --   T8   Right Shoulder External Rotation  --   T3   Left Shoulder Flexion  --   WNL   Left Shoulder ABduction  --   WNL   Left Shoulder Internal Rotation  --   T4   Left Shoulder External Rotation  --   T3   Cervical Flexion  52    Cervical Extension  65    Cervical - Right Side Bend  45   was 35   Cervical - Left Side Bend  45   was 47   Cervical - Right Rotation  80   was 80   Cervical - Left Rotation  80   was 80   Thoracic Flexion  maintains kyphotic curve    Thoracic Extension  severe limitation   was severe limitation   Thoracic - Right Side Bend  severe limitation    was severe limitation   Thoracic - Left Side Bend  severe limitation    was severe limitation   Thoracic - Right Rotation  WNL   was severe limitation   Thoracic - Left Rotation  WNL   was severe limitation          PT Education - 05/04/19 1042    Education Details  reassessment  findings, improving compliance with HEP, 4week HEP POC    Person(s) Educated  Patient    Methods  Explanation    Comprehension  Verbalized understanding       PT Short Term Goals - 05/04/19 1045      PT SHORT TERM GOAL #1   Title  Patient to be compliant with TOS oriented HEP, to be updated PRN    Baseline  9/22: 3-4x/week    Time  1    Period  Weeks    Status  Achieved    Target Date  04/01/19      PT SHORT TERM GOAL #2   Title  Patient to be internally aware of and able to self-correct postural limitations on an independent basis at least 75% of the time in order to reduce pain and TOS related symptoms    Baseline  9/22: pt realizes postural impairments 25% of the time    Time  1    Period  Weeks    Status  Not Met        PT Long Term Goals - 05/04/19 1048      PT LONG TERM GOAL #1   Title  Patient to demonstrate thoracic mobility as being only 50% impaired in order to allow him to more easily correct posture and reduce pain    Baseline  9/22: see ROM    Time  4    Period  Weeks    Status  Partially Met      PT LONG TERM GOAL #2   Title  Patient to report at least a 30% reduction in TOS related symptoms in order to show improvement of condition and to reduce pain    Baseline  9/22: no significant reduction at all, not expecting it    Time  4    Period  Weeks    Status  Not Met      PT LONG TERM GOAL #3   Title  Patient  to be able to physically demonstrate correct biomechanical set up of workstation in order to promote improved postural tendencies and reduce TOS symptoms    Baseline  9/22: 50% of work day at desk, 50% on couch    Time  4    Period  Weeks    Status  Partially [place] #4   Title  Patient to be able to maintain correct functional posture for at least 60 minutes without muscular fatigue in order to promote postural muscle strength and endurance    Baseline  9/22: unable    Time  4    Period  Weeks    Status  Not Met          Plan - 05/04/19 1112    Clinical Impression Statement  Pt reports to therapy this date after 2 week gap due to back pain exacerbation. Objectively, pt with some improvements in thoracic ROM, but continues to be limited in gross movements possibly due to low back pain limits. Pt has made some improvements in work station and has been educated extensively on cues to help improve sitting and standing posture to improve overall posture and reduce pain with prolonged positions. Also, educated pt on taking breaks from prolonged sitting and standing to minimize pain and stiffness and overall posture. Pt with extensive HEP and knowledgeable on exercise performance. Pt plateauing with therapy and fair compliance, but appears to be motivated this session. Pt is appropriate to d/c to 4 week HEP POC to further improve strength, posture, and reduce pain with mobility with 1 f/u to further progress HEP if appropriate.    Personal Factors and Comorbidities  Comorbidity 1;Past/Current Experience;Time since onset of injury/illness/exacerbation    Examination-Activity Limitations  Bend;Carry;Lift    Examination-Participation Restrictions  Other;Cleaning    Stability/Clinical Decision Making  Stable/Uncomplicated    Rehab Potential  Fair    PT Frequency  Other (comment)   1 f/u after 4 week HEP POC   PT Duration  4 weeks    PT Treatment/Interventions  ADLs/Self Care Home Management;Aquatic Therapy;Biofeedback;Cryotherapy;Electrical Stimulation;Moist Heat;Traction;Ultrasound;Gait training;Stair training;Functional mobility training;Therapeutic activities;Therapeutic exercise;Balance training;Neuromuscular re-education;Patient/family education;Orthotic Fit/Training;Manual techniques;Passive range of motion;Dry needling;Taping;Joint Manipulations    PT Next Visit Plan  d/c to 4 week HEP POC with 1 f/u after    PT Home Exercise Plan  For TOS: thoracic extension stretch over towel roll in supine, scap retractions,  doorway stretch, lumbar roll; 8/20: discontinue doorway stretch and perform corner stretch instead, supine chin tucks; 8/25: supine serratus punch 8/27- triceps stretch, chin tuck with rotation, 9/3: black burn and wall slides added    Consulted and Agree with Plan of Care  Patient       Patient will benefit from skilled therapeutic intervention in order to improve the following deficits and impairments:  Decreased activity tolerance, Decreased balance, Decreased range of motion, Decreased strength, Hypomobility, Increased fascial restricitons, Increased muscle spasms, Impaired perceived functional ability, Impaired flexibility, Postural dysfunction, Pain  Visit Diagnosis: Pain in thoracic spine  Abnormal posture  Other symptoms and signs involving the musculoskeletal system     Problem List Patient Active Problem List   Diagnosis Date Noted  . Varicose veins of both lower extremities with pain 08/22/2017  . Chronic venous insufficiency 08/22/2017    Talbot Grumbling PT, DPT 05/04/19, 12:14 PM [place] [address] [place], [place], [address] Phone: [phone]   Fax:  [phone]  Name: Paul Sawyer MRN: [id number] Date of Birth: 03/13/1971

## 2019-05-06 ENCOUNTER — Encounter: Payer: Self-pay | Admitting: Vascular Surgery

## 2019-05-14 MED FILL — CYCLOBENZAPRINE HCL 10 MG T: 10 | 30 days supply | Qty: 30 | Fill #1

## 2019-05-17 MED FILL — BUPRENORPHIN-NALOXON 8-2 MG: 8-2 | 28 days supply | Qty: 28 | Fill #0

## 2019-06-02 ENCOUNTER — Telehealth (HOSPITAL_COMMUNITY): Payer: Self-pay

## 2019-06-02 ENCOUNTER — Ambulatory Visit (HOSPITAL_COMMUNITY): Payer: 59

## 2019-06-02 NOTE — Telephone Encounter (Signed)
No Show #3. Therapist called pt due to not showing for reassessment today at 10:30. No answer, so left voicemail regarding pt missing reassessment, educated pt to return call to discuss how his HEP is going, discharge versus rescheduling reassessment, and left clinic phone number.   Talbot Grumbling PT, DPT 06/02/19, 12:14 PM [phone]

## 2019-06-28 ENCOUNTER — Other Ambulatory Visit: Payer: Self-pay

## 2019-06-28 ENCOUNTER — Ambulatory Visit (INDEPENDENT_AMBULATORY_CARE_PROVIDER_SITE_OTHER): Payer: Self-pay

## 2019-06-28 DIAGNOSIS — I8393 Asymptomatic varicose veins of bilateral lower extremities: Secondary | ICD-10-CM

## 2019-06-28 NOTE — Progress Notes (Signed)
Treated pt's bilateral leg spider and reticular veins with 4 mL of 1% Asclera. Dr. Trula Slade came in to assess pt prior to treatment. Pt. Tolerated okay-very antsy as he stated he had bad experience at dentist with needles in the past. Explained to pt he may need further treatments in the future. Wrapped both thighs with ACE wrap, as he only brought knee high compression stockings. He has thigh high ones at home and will put them on when he gets home. Provided pt with both written and verbal post-procedure instructions. Will follow PRN.  Photos: Yes.    Compression stockings applied: Yes.

## 2019-06-30 DIAGNOSIS — G894 Chronic pain syndrome: Secondary | ICD-10-CM | POA: Diagnosis not present

## 2019-06-30 DIAGNOSIS — F1129 Opioid dependence with unspecified opioid-induced disorder: Secondary | ICD-10-CM | POA: Diagnosis not present

## 2019-06-30 DIAGNOSIS — M79669 Pain in unspecified lower leg: Secondary | ICD-10-CM | POA: Diagnosis not present

## 2019-06-30 DIAGNOSIS — M545 Low back pain: Secondary | ICD-10-CM | POA: Diagnosis not present

## 2019-06-30 MED FILL — BUPRENORPHIN-NALOXON 8-2 MG: 8-2 | 28 days supply | Qty: 28 | Fill #0

## 2019-07-01 ENCOUNTER — Encounter: Payer: Self-pay | Admitting: Surgery

## 2019-07-21 MED FILL — CYCLOBENZAPRINE HCL 10 MG T: 10 | 30 days supply | Qty: 30 | Fill #2

## 2019-07-27 MED FILL — BUPRENORPHIN-NALOXON 8-2 MG: 8-2 | 28 days supply | Qty: 28 | Fill #1

## 2019-07-28 ENCOUNTER — Encounter (HOSPITAL_COMMUNITY): Payer: Self-pay | Admitting: Physical Therapy

## 2019-07-28 NOTE — Therapy (Unsigned)
[place], [place], [address] Phone: [phone]   Fax:  [phone]  Patient Details  Name: CLAXTON LEVITZ MRN: [id number] Date of Birth: 19-Nov-1970 Referring Provider:  No ref. provider found  Encounter Date: 07/28/2019 PHYSICAL THERAPY DISCHARGE SUMMARY  Visits from Start of Care: 7  Current functional level related to goals / functional outcomes: Unknown as pt did not return    Remaining deficits: unknown   Education / Equipment: HEP Plan: Patient agrees to discharge.  Patient goals were partially met. Patient is being discharged due to not returning since the last visit.  ?????      Rayetta Humphrey, PT CLT [phone] 07/28/2019, 11:45 AM  [place], [place], [address] Phone: [phone]   Fax:  [phone]

## 2019-08-19 MED FILL — CYCLOBENZAPRINE HCL 10 MG T: 10 | 30 days supply | Qty: 30 | Fill #3

## 2019-08-20 MED FILL — BUPRENORPHIN-NALOXON 8-2 MG: 8-2 | 28 days supply | Qty: 28 | Fill #2

## 2019-08-25 DIAGNOSIS — K760 Fatty (change of) liver, not elsewhere classified: Secondary | ICD-10-CM | POA: Diagnosis not present

## 2019-08-25 DIAGNOSIS — Z86010 Personal history of colonic polyps: Secondary | ICD-10-CM | POA: Diagnosis not present

## 2019-08-25 DIAGNOSIS — R7301 Impaired fasting glucose: Secondary | ICD-10-CM | POA: Diagnosis not present

## 2019-08-25 DIAGNOSIS — Z6827 Body mass index (BMI) 27.0-27.9, adult: Secondary | ICD-10-CM | POA: Diagnosis not present

## 2019-08-25 DIAGNOSIS — M531 Cervicobrachial syndrome: Secondary | ICD-10-CM | POA: Diagnosis not present

## 2019-08-25 DIAGNOSIS — R1030 Lower abdominal pain, unspecified: Secondary | ICD-10-CM | POA: Diagnosis not present

## 2019-08-25 DIAGNOSIS — G894 Chronic pain syndrome: Secondary | ICD-10-CM | POA: Diagnosis not present

## 2019-08-25 DIAGNOSIS — Z980 Intestinal bypass and anastomosis status: Secondary | ICD-10-CM | POA: Diagnosis not present

## 2019-08-25 DIAGNOSIS — M79606 Pain in leg, unspecified: Secondary | ICD-10-CM | POA: Diagnosis not present

## 2019-08-25 DIAGNOSIS — Z87442 Personal history of urinary calculi: Secondary | ICD-10-CM | POA: Diagnosis not present

## 2019-08-26 ENCOUNTER — Other Ambulatory Visit: Payer: Self-pay | Admitting: Internal Medicine

## 2019-08-26 ENCOUNTER — Other Ambulatory Visit (HOSPITAL_COMMUNITY): Payer: Self-pay | Admitting: Internal Medicine

## 2019-08-26 ENCOUNTER — Encounter (INDEPENDENT_AMBULATORY_CARE_PROVIDER_SITE_OTHER): Payer: Self-pay | Admitting: *Deleted

## 2019-08-26 DIAGNOSIS — K760 Fatty (change of) liver, not elsewhere classified: Secondary | ICD-10-CM

## 2019-08-26 DIAGNOSIS — R1031 Right lower quadrant pain: Secondary | ICD-10-CM

## 2019-09-03 ENCOUNTER — Other Ambulatory Visit: Payer: Self-pay

## 2019-09-03 ENCOUNTER — Ambulatory Visit (HOSPITAL_COMMUNITY)
Admission: RE | Admit: 2019-09-03 | Discharge: 2019-09-03 | Disposition: A | Discharge status: Home / Self Care | Payer: 59 | Source: Ambulatory Visit | Attending: Internal Medicine | Admitting: Internal Medicine

## 2019-09-03 DIAGNOSIS — R109 Unspecified abdominal pain: Secondary | ICD-10-CM | POA: Diagnosis not present

## 2019-09-03 DIAGNOSIS — K760 Fatty (change of) liver, not elsewhere classified: Secondary | ICD-10-CM | POA: Diagnosis not present

## 2019-09-03 DIAGNOSIS — R1031 Right lower quadrant pain: Secondary | ICD-10-CM | POA: Insufficient documentation

## 2019-09-08 ENCOUNTER — Other Ambulatory Visit (INDEPENDENT_AMBULATORY_CARE_PROVIDER_SITE_OTHER): Payer: 59

## 2019-09-08 ENCOUNTER — Ambulatory Visit (INDEPENDENT_AMBULATORY_CARE_PROVIDER_SITE_OTHER): Payer: 59 | Admitting: Gastroenterology

## 2019-09-08 ENCOUNTER — Encounter: Payer: Self-pay | Admitting: Gastroenterology

## 2019-09-08 VITALS — BP 106/58 | HR 70 | Temp 97.6°F | Ht 76.0 in | Wt 205.0 lb

## 2019-09-08 DIAGNOSIS — Z9884 Bariatric surgery status: Secondary | ICD-10-CM | POA: Diagnosis not present

## 2019-09-08 DIAGNOSIS — R1011 Right upper quadrant pain: Secondary | ICD-10-CM

## 2019-09-08 DIAGNOSIS — Z1212 Encounter for screening for malignant neoplasm of rectum: Secondary | ICD-10-CM

## 2019-09-08 DIAGNOSIS — Z1211 Encounter for screening for malignant neoplasm of colon: Secondary | ICD-10-CM | POA: Diagnosis not present

## 2019-09-08 DIAGNOSIS — R7989 Other specified abnormal findings of blood chemistry: Secondary | ICD-10-CM

## 2019-09-08 DIAGNOSIS — R17 Unspecified jaundice: Secondary | ICD-10-CM | POA: Diagnosis not present

## 2019-09-08 DIAGNOSIS — Z01818 Encounter for other preprocedural examination: Secondary | ICD-10-CM | POA: Diagnosis not present

## 2019-09-08 DIAGNOSIS — R945 Abnormal results of liver function studies: Secondary | ICD-10-CM | POA: Diagnosis not present

## 2019-09-08 LAB — CBC WITH DIFFERENTIAL/PLATELET
Basophils Absolute: 0 10*3/uL (ref 0.0–0.1)
Basophils Relative: 0.7 % (ref 0.0–3.0)
Eosinophils Absolute: 0.2 10*3/uL (ref 0.0–0.7)
Eosinophils Relative: 3.6 % (ref 0.0–5.0)
HCT: 42.8 % (ref 39.0–52.0)
Hemoglobin: 14.4 g/dL (ref 13.0–17.0)
Lymphocytes Relative: 47.1 % — ABNORMAL HIGH (ref 12.0–46.0)
Lymphs Abs: 2.6 10*3/uL (ref 0.7–4.0)
MCHC: 33.6 g/dL (ref 30.0–36.0)
MCV: 91.8 fl (ref 78.0–100.0)
Monocytes Absolute: 0.4 10*3/uL (ref 0.1–1.0)
Monocytes Relative: 6.8 % (ref 3.0–12.0)
Neutro Abs: 2.3 10*3/uL (ref 1.4–7.7)
Neutrophils Relative %: 41.8 % — ABNORMAL LOW (ref 43.0–77.0)
Platelets: 172 10*3/uL (ref 150.0–400.0)
RBC: 4.66 Mil/uL (ref 4.22–5.81)
RDW: 13.3 % (ref 11.5–15.5)
WBC: 5.4 10*3/uL (ref 4.0–10.5)

## 2019-09-08 LAB — COMPREHENSIVE METABOLIC PANEL
ALT: 15 U/L (ref 0–53)
AST: 18 U/L (ref 0–37)
Albumin: 4.2 g/dL (ref 3.5–5.2)
Alkaline Phosphatase: 73 U/L (ref 39–117)
BUN: 13 mg/dL (ref 6–23)
CO2: 30 mEq/L (ref 19–32)
Calcium: 9.3 mg/dL (ref 8.4–10.5)
Chloride: 102 mEq/L (ref 96–112)
Creatinine, Ser: 0.87 mg/dL (ref 0.40–1.50)
GFR: 92.66 mL/min (ref >60.00)
Glucose, Bld: 86 mg/dL (ref 70–99)
Potassium: 4 mEq/L (ref 3.5–5.1)
Sodium: 138 mEq/L (ref 135–145)
Total Bilirubin: 1.3 mg/dL — ABNORMAL HIGH (ref 0.2–1.2)
Total Protein: 6.8 g/dL (ref 6.0–8.3)

## 2019-09-08 LAB — HIGH SENSITIVITY CRP: CRP, High Sensitivity: <0.2 mg/L (ref 0.000–5.000)

## 2019-09-08 LAB — IBC + FERRITIN
Ferritin: 95 ng/mL (ref 22.0–322.0)
Iron: 67 ug/dL (ref 42–165)
Saturation Ratios: 21.4 % (ref 20.0–50.0)
Transferrin: 224 mg/dL (ref 212.0–360.0)

## 2019-09-08 LAB — VITAMIN B12: Vitamin B-12: 886 pg/mL (ref 211–911)

## 2019-09-08 LAB — IGA: IgA: 169 mg/dL (ref 68–378)

## 2019-09-08 LAB — FOLATE: Folate: >24.1 ng/mL (ref >5.9)

## 2019-09-08 MED ORDER — NA SULFATE-K SULFATE-MG SULF 17.5-3.13-1.6 GM/177ML PO SOLN: ORAL | 0 refills | Status: DC

## 2019-09-08 MED FILL — SUPREP BOWEL PREP KIT: 17.5-3.13-1 | 1 days supply | Qty: 354 | Fill #0

## 2019-09-08 NOTE — Progress Notes (Signed)
Paul Sawyer    [id number]    12-29-1970  Primary Care Physician:Hall, Edwinna Areola, MD  Referring Physician: Celene Squibb, MD [place],  [place] [address]   Chief complaint: Right upper quadrant and epigastric abdominal pain HPI: 49 year old male with history of gastric bypass surgery is here for new patient visit with complaints of intermittent right upper quadrant and epigastric abdominal pain for past 1 year. His symptoms were worse few months ago but since he changed his diet and avoiding alcohol somewhat better. Last year he was binge drinking on weekends with 6-7 drinks of hard liquor/cocktails per night but has since changed his habits and is no longer drinking alcohol  Gastric bypass 15 years ago, lost around 200 lbs after surgery  S/p Cholecystectomy about 16 years ago for dyskinesia  RUQ ultrasoun 09/03/2019: s/p cholecystectomy, no biliary dilation.   He has chronic back pain and is on Suboxone  No family history of GI malignancy, IBD or celiac disease.  No melena or blood per rectum. Denies NSAID use.   Outpatient Encounter Medications as of 09/08/2019  Medication Sig  . Buprenorphine HCl-Naloxone HCl 8-2 MG FILM   . cyclobenzaprine (FLEXERIL) 10 MG tablet    No facility-administered encounter medications on file as of 09/08/2019.    Allergies as of 09/08/2019 - Review Complete 09/08/2019  Allergen Reaction Noted  . Ciprofloxacin  07/07/2007    Past Medical History:  Diagnosis Date  . PAD (peripheral artery disease) ([place])   . Varicose veins of both lower extremities     Past Surgical History:  Procedure Laterality Date  . CHOLECYSTECTOMY  2004  . GASTRIC BYPASS  2005    Family History  Problem Relation Age of Onset  . Diabetes Father   . Heart disease Father     Social History   Socioeconomic History  . Marital status: Married    Spouse name: Not on file  . Number of children: Not on file  . Years of education:  Not on file  . Highest education level: Not on file  Occupational History  . Not on file  Tobacco Use  . Smoking status: Never Smoker  . Smokeless tobacco: Never Used  Substance and Sexual Activity  . Alcohol use: Yes    Alcohol/week: 1.0 standard drinks    Types: 1 Glasses of wine per week    Comment: weekly  . Drug use: No  . Sexual activity: Not on file  Other Topics Concern  . Not on file  Social History Narrative  . Not on file   Social Determinants of Health   Financial Resource Strain:   . Difficulty of Paying Living Expenses: Not on file  Food Insecurity:   . Worried About Charity fundraiser in the Last Year: Not on file  . Ran Out of Food in the Last Year: Not on file  Transportation Needs:   . Lack of Transportation (Medical): Not on file  . Lack of Transportation (Non-Medical): Not on file  Physical Activity:   . Days of Exercise per Week: Not on file  . Minutes of Exercise per Session: Not on file  Stress:   . Feeling of Stress : Not on file  Social Connections:   . Frequency of Communication with Friends and Family: Not on file  . Frequency of Social Gatherings with Friends and Family: Not on file  . Attends Religious Services: Not on  file  . Active Member of Clubs or Organizations: Not on file  . Attends Archivist Meetings: Not on file  . Marital Status: Not on file  Intimate Partner Violence:   . Fear of Current or Ex-Partner: Not on file  . Emotionally Abused: Not on file  . Physically Abused: Not on file  . Sexually Abused: Not on file      Review of systems:  All other review of systems negative except as mentioned in the HPI.   Physical Exam: Vitals:   09/08/19 0845  BP: (!) 106/58  Pulse: 70  Temp: 97.6 F (36.4 C)   Body mass index is 24.95 kg/m. Gen:      No acute distress HEENT:  EOMI, sclera anicteric Neck:     No masses; no thyromegaly Lungs:    Clear to auscultation bilaterally; normal respiratory effort CV:          Regular rate and rhythm; no murmurs Abd:      + bowel sounds; soft, non-tender; no palpable masses, no distension Ext:    No edema; adequate peripheral perfusion Skin:      Warm and dry; no rash Neuro: alert and oriented x 3 Psych: normal mood and affect  Data Reviewed:  Reviewed labs, radiology imaging, old records and pertinent past GI work up   Assessment and Plan/Recommendations:  49 year old male with history of cholecystectomy, gastric bypass surgery with complaints of epigastric and right upper quadrant abdominal pain  Check TTG IgA antibody to exclude celiac disease Check CBC, CRP, iron panel, B12 and folate given history of bypass surgery to exclude nutritional deficiencies Mild elevation in bilirubin, recheck LFT.  Elevated bilirubin is likely benign secondary to Gilbert's syndrome   Schedule for EGD to exclude anastomotic ulcer s/p gastric bypass surgery He is due for colorectal cancer screening, schedule colonoscopy along with EGD  The risks and benefits as well as alternatives of endoscopic procedure(s) have been discussed and reviewed. All questions answered. The patient agrees to proceed.  The patient was provided an opportunity to ask questions and all were answered. The patient agreed with the plan and demonstrated an understanding of the instructions.  Damaris Hippo , MD    CC: Celene Squibb, MD

## 2019-09-08 NOTE — Patient Instructions (Addendum)
If you are age 51 or older, your body mass index should be between 23-30. Your Body mass index is 24.95 kg/m. If this is out of the aforementioned range listed, please consider follow up with your Primary Care Provider.  If you are age 27 or younger, your body mass index should be between 19-25. Your Body mass index is 24.95 kg/m. If this is out of the aformentioned range listed, please consider follow up with your Primary Care Provider.   You have been scheduled for an endoscopy and colonoscopy. Please follow the written instructions given to you at your visit today. Please pick up your prep supplies at the pharmacy within the next 1-3 days. If you use inhalers (even only as needed), please bring them with you on the day of your procedure. Your physician has requested that you go to www.startemmi.com and enter the access code given to you at your visit today. This web site gives a general overview about your procedure. However, you should still follow specific instructions given to you by our office regarding your preparation for the procedure.  We have sent the following medications to your pharmacy for you to pick up at your convenience: [place] provider has requested that you go to the basement level for lab work before leaving today. Press "B" on the elevator. The lab is located at the first door on the left as you exit the elevator.   Thank you for choosing me and [facility].    Harl Bowie, MD

## 2019-09-09 ENCOUNTER — Encounter: Payer: Self-pay | Admitting: Gastroenterology

## 2019-09-09 LAB — TISSUE TRANSGLUTAMINASE, IGA: (tTG) Ab, IgA: 1 U/mL

## 2019-09-17 ENCOUNTER — Telehealth: Payer: Self-pay

## 2019-09-17 ENCOUNTER — Encounter: Payer: Self-pay | Admitting: Gastroenterology

## 2019-09-17 NOTE — Telephone Encounter (Signed)
Returned pt's call regarding him inquiring about having another sclerotherapy treatment done under some type of anesthesia. We discussed why this is not something we are able to offer. Pt's larger reticular veins have improved a little though he still is feeling discomfort in the feet. He does not feel this is a neuropathy issue and believes it is coming from the veins in his legs. Pt will call us back if he wants to return to office.

## 2019-09-20 ENCOUNTER — Other Ambulatory Visit: Payer: Self-pay | Admitting: Gastroenterology

## 2019-09-20 ENCOUNTER — Ambulatory Visit (INDEPENDENT_AMBULATORY_CARE_PROVIDER_SITE_OTHER): Payer: 59

## 2019-09-20 DIAGNOSIS — Z1159 Encounter for screening for other viral diseases: Secondary | ICD-10-CM | POA: Diagnosis not present

## 2019-09-20 LAB — SARS CORONAVIRUS 2 (TAT 6-24 HRS): SARS Coronavirus 2: NEGATIVE

## 2019-09-22 ENCOUNTER — Other Ambulatory Visit: Payer: Self-pay

## 2019-09-22 ENCOUNTER — Ambulatory Visit (AMBULATORY_SURGERY_CENTER): Payer: 59 | Admitting: Gastroenterology

## 2019-09-22 ENCOUNTER — Encounter: Payer: Self-pay | Admitting: Gastroenterology

## 2019-09-22 VITALS — BP 119/54 | HR 68 | Temp 95.7°F | Resp 13 | Ht 76.0 in | Wt 205.0 lb

## 2019-09-22 DIAGNOSIS — R1011 Right upper quadrant pain: Secondary | ICD-10-CM

## 2019-09-22 DIAGNOSIS — F1129 Opioid dependence with unspecified opioid-induced disorder: Secondary | ICD-10-CM | POA: Diagnosis not present

## 2019-09-22 DIAGNOSIS — M79669 Pain in unspecified lower leg: Secondary | ICD-10-CM | POA: Diagnosis not present

## 2019-09-22 DIAGNOSIS — Z1211 Encounter for screening for malignant neoplasm of colon: Secondary | ICD-10-CM

## 2019-09-22 DIAGNOSIS — K621 Rectal polyp: Secondary | ICD-10-CM | POA: Diagnosis not present

## 2019-09-22 DIAGNOSIS — M545 Low back pain: Secondary | ICD-10-CM | POA: Diagnosis not present

## 2019-09-22 DIAGNOSIS — Z1212 Encounter for screening for malignant neoplasm of rectum: Secondary | ICD-10-CM

## 2019-09-22 DIAGNOSIS — D128 Benign neoplasm of rectum: Secondary | ICD-10-CM

## 2019-09-22 DIAGNOSIS — G894 Chronic pain syndrome: Secondary | ICD-10-CM | POA: Diagnosis not present

## 2019-09-22 MED ORDER — SODIUM CHLORIDE 0.9 % IV SOLN: 500.0000 mL | Freq: Once | INTRAVENOUS | Status: DC

## 2019-09-22 MED FILL — BUPRENORPHIN-NALOXON 8-2 MG: 8-2 | 28 days supply | Qty: 28 | Fill #1

## 2019-09-22 MED FILL — GABAPENTIN 300 MG CAPSULE: 300 | 30 days supply | Qty: 90 | Fill #0

## 2019-09-22 NOTE — Progress Notes (Signed)
To PACU, vss. Report to [email or web address].tb 

## 2019-09-22 NOTE — Op Note (Signed)
[facility] Patient Name: Paul Sawyer Procedure Date: 09/22/2019 9:08 AM MRN: [id number] Endoscopist: Mauri Pole , MD Age: 49 Referring MD:  Date of Birth: [birth year] Gender: Male Account #: [id number] Procedure:                Upper GI endoscopy Indications:              Epigastric abdominal pain, Abdominal pain in the                            right upper quadrant Medicines:                Monitored Anesthesia Care Procedure:                Pre-Anesthesia Assessment:                           - Prior to the procedure, a History and Physical                            was performed, and patient medications and                            allergies were reviewed. The patient's tolerance of                            previous anesthesia was also reviewed. The risks                            and benefits of the procedure and the sedation                            options and risks were discussed with the patient.                            All questions were answered, and informed consent                            was obtained. Prior Anticoagulants: The patient has                            taken no previous anticoagulant or antiplatelet                            agents. ASA Grade Assessment: II - A patient with                            mild systemic disease. After reviewing the risks                            and benefits, the patient was deemed in                            satisfactory condition to undergo the procedure.  After obtaining informed consent, the endoscope was                            passed under direct vision. Throughout the                            procedure, the patient's blood pressure, pulse, and                            oxygen saturations were monitored continuously. The                            Endoscope was introduced through the mouth, and                            advanced to the afferent and  efferent jejunal                            loops. The upper GI endoscopy was accomplished                            without difficulty. The patient tolerated the                            procedure well. Scope In: Scope Out: Findings:                 The esophagus was normal.                           The Z-line was regular and was found 45 cm from the                            incisors.                           Evidence of a gastric bypass was found. A gastric                            pouch with a normal size was found. The staple line                            appeared intact. The gastrojejunal anastomosis was                            characterized by healthy appearing mucosa. This was                            traversed. The pouch-to-jejunum limb was                            characterized by healthy appearing mucosa. The                            jejunojejunal anastomosis was characterized by  erosion and erythema.                           The examined jejunum was normal. Complications:            No immediate complications. Estimated Blood Loss:     Estimated blood loss was minimal. Impression:               - Normal esophagus.                           - Z-line regular, 45 cm from the incisors.                           - Gastric bypass with a normal-sized pouch and                            intact staple line. Gastrojejunal anastomosis                            characterized by healthy appearing mucosa.                           - Normal examined jejunum.                           - No specimens collected. Recommendation:           - Patient has a contact number available for                            emergencies. The signs and symptoms of potential                            delayed complications were discussed with the                            patient. Return to normal activities tomorrow.                            Written  discharge instructions were provided to the                            patient.                           - Resume previous diet.                           - Continue present medications.                           - See the other procedure note for documentation of                            additional recommendations. Mauri Pole, MD 09/22/2019 9:49:06 AM This report has been signed electronically.

## 2019-09-22 NOTE — Progress Notes (Signed)
Called to room to assist during endoscopic procedure.  Patient ID and intended procedure confirmed with present staff. Received instructions for my participation in the procedure from the performing physician.  

## 2019-09-22 NOTE — Op Note (Signed)
[facility] Patient Name: Paul Sawyer Procedure Date: 09/22/2019 9:07 AM MRN: [id number] Endoscopist: Mauri Pole , MD Age: 49 Referring MD:  Date of Birth: May 10, 1971 Gender: Male Account #: [id number] Procedure:                Colonoscopy Indications:              Screening for colorectal malignant neoplasm Medicines:                Monitored Anesthesia Care Procedure:                Pre-Anesthesia Assessment:                           - Prior to the procedure, a History and Physical                            was performed, and patient medications and                            allergies were reviewed. The patient's tolerance of                            previous anesthesia was also reviewed. The risks                            and benefits of the procedure and the sedation                            options and risks were discussed with the patient.                            All questions were answered, and informed consent                            was obtained. Prior Anticoagulants: The patient has                            taken no previous anticoagulant or antiplatelet                            agents. ASA Grade Assessment: II - A patient with                            mild systemic disease. After reviewing the risks                            and benefits, the patient was deemed in                            satisfactory condition to undergo the procedure.                           After obtaining informed consent, the colonoscope  was passed under direct vision. Throughout the                            procedure, the patient's blood pressure, pulse, and                            oxygen saturations were monitored continuously. The                            Colonoscope was introduced through the anus and                            advanced to the the cecum, identified by                            appendiceal orifice and  ileocecal valve. The                            colonoscopy was performed without difficulty. The                            patient tolerated the procedure well. The quality                            of the bowel preparation was adequate. The                            ileocecal valve, appendiceal orifice, and rectum                            were photographed. Scope In: 9:18:00 AM Scope Out: 9:44:17 AM Scope Withdrawal Time: 0 hours 10 minutes 18 seconds  Total Procedure Duration: 0 hours 26 minutes 17 seconds  Findings:                 The perianal and digital rectal examinations were                            normal.                           A 3 mm polyp was found in the rectum. The polyp was                            sessile. The polyp was removed with a cold snare.                            Resection and retrieval were complete.                           Non-bleeding internal hemorrhoids were found during                            retroflexion. The hemorrhoids were small.  The exam was otherwise without abnormality. Complications:            No immediate complications. Estimated Blood Loss:     Estimated blood loss was minimal. Impression:               - One 3 mm polyp in the rectum, removed with a cold                            snare. Resected and retrieved.                           - Non-bleeding internal hemorrhoids.                           - The examination was otherwise normal. Recommendation:           - Patient has a contact number available for                            emergencies. The signs and symptoms of potential                            delayed complications were discussed with the                            patient. Return to normal activities tomorrow.                            Written discharge instructions were provided to the                            patient.                           - Resume previous diet.                            - Continue present medications.                           - Await pathology results.                           - Repeat colonoscopy in 5-10 years for surveillance                            based on pathology results.                           - For future colonoscopy the patient will require                            an extended preparation. If there are any                            questions, please contact the gastroenterologist.                           -  Return to GI office at the next available                            appointment. Mauri Pole, MD 09/22/2019 9:50:57 AM This report has been signed electronically.

## 2019-09-22 NOTE — Patient Instructions (Signed)
YOU HAD AN ENDOSCOPIC PROCEDURE TODAY AT THE [facility]:   Refer to the procedure report that was given to you for any specific questions about what was found during the examination.  If the procedure report does not answer your questions, please call your gastroenterologist to clarify.  If you requested that your care partner not be given the details of your procedure findings, then the procedure report has been included in a sealed envelope for you to review at your convenience later.  YOU SHOULD EXPECT: Some feelings of bloating in the abdomen. Passage of more gas than usual.  Walking can help get rid of the air that was put into your GI tract during the procedure and reduce the bloating. If you had a lower endoscopy (such as a colonoscopy or flexible sigmoidoscopy) you may notice spotting of blood in your stool or on the toilet paper. If you underwent a bowel prep for your procedure, you may not have a normal bowel movement for a few days.  Please Note:  You might notice some irritation and congestion in your nose or some drainage.  This is from the oxygen used during your procedure.  There is no need for concern and it should clear up in a day or so.  SYMPTOMS TO REPORT IMMEDIATELY:   Following lower endoscopy (colonoscopy or flexible sigmoidoscopy):  Excessive amounts of blood in the stool  Significant tenderness or worsening of abdominal pains  Swelling of the abdomen that is new, acute  Fever of 100F or higher   Following upper endoscopy (EGD)  Vomiting of blood or coffee ground material  New chest pain or pain under the shoulder blades  Painful or persistently difficult swallowing  New shortness of breath  Fever of 100F or higher  Black, tarry-looking stools  For urgent or emergent issues, a gastroenterologist can be reached at any hour by calling [phone].   DIET:  We do recommend a small meal at first, but then you may proceed to your regular diet.  Drink  plenty of fluids but you should avoid alcoholic beverages for 24 hours.  ACTIVITY:  You should plan to take it easy for the rest of today and you should NOT DRIVE or use heavy machinery until tomorrow (because of the sedation medicines used during the test).    FOLLOW UP: Our staff will call the number listed on your records 48-72 hours following your procedure to check on you and address any questions or concerns that you may have regarding the information given to you following your procedure. If we do not reach you, we will leave a message.  We will attempt to reach you two times.  During this call, we will ask if you have developed any symptoms of COVID 19. If you develop any symptoms (ie: fever, flu-like symptoms, shortness of breath, cough etc.) before then, please call [phone].  If you test positive for Covid 19 in the 2 weeks post procedure, please call and report this information to us.    If any biopsies were taken you will be contacted by phone or by letter within the next 1-3 weeks.  Please call us at [phone] if you have not heard about the biopsies in 3 weeks.    SIGNATURES/CONFIDENTIALITY: You and/or your care partner have signed paperwork which will be entered into your electronic medical record.  These signatures attest to the fact that that the information above on your After Visit Summary has been reviewed and is   understood.  Full responsibility of the confidentiality of this discharge information lies with you and/or your care-partner.   Thank you for allowing us to provide your healthcare today. 

## 2019-09-24 ENCOUNTER — Telehealth: Payer: Self-pay

## 2019-09-24 NOTE — Telephone Encounter (Signed)
  Follow up Call-  Call back number 09/22/2019  Post procedure Call Back phone  # [phone]  Permission to leave phone message Yes     Patient questions:  Do you have a fever, pain , or abdominal swelling? No. Pain Score  0 *  Have you tolerated food without any problems? Yes.    Have you been able to return to your normal activities? Yes.    Do you have any questions about your discharge instructions: Diet   No. Medications  No. Follow up visit  No.  Do you have questions or concerns about your Care? No.  Actions: * If pain score is 4 or above: No action needed, pain <4.  1. Have you developed a fever since your procedure? No  2.   Have you had an respiratory symptoms (SOB or cough) since your procedure? No  3.   Have you tested positive for COVID 19 since your procedure No  4.   Have you had any family members/close contacts diagnosed with the COVID 19 since your procedure?  No   If yes to any of these questions please route to Joylene John, RN and Alphonsa Gin, RN.

## 2019-09-30 ENCOUNTER — Encounter: Payer: Self-pay | Admitting: Gastroenterology

## 2019-10-07 ENCOUNTER — Ambulatory Visit (INDEPENDENT_AMBULATORY_CARE_PROVIDER_SITE_OTHER): Payer: 59 | Admitting: Gastroenterology

## 2019-10-07 ENCOUNTER — Encounter: Payer: Self-pay | Admitting: Gastroenterology

## 2019-10-07 VITALS — BP 126/62 | HR 72 | Temp 98.3°F | Ht 76.0 in | Wt 211.6 lb

## 2019-10-07 DIAGNOSIS — Z9884 Bariatric surgery status: Secondary | ICD-10-CM

## 2019-10-07 DIAGNOSIS — K760 Fatty (change of) liver, not elsewhere classified: Secondary | ICD-10-CM

## 2019-10-07 DIAGNOSIS — K589 Irritable bowel syndrome without diarrhea: Secondary | ICD-10-CM

## 2019-10-07 DIAGNOSIS — R1011 Right upper quadrant pain: Secondary | ICD-10-CM | POA: Diagnosis not present

## 2019-10-07 DIAGNOSIS — Z9049 Acquired absence of other specified parts of digestive tract: Secondary | ICD-10-CM | POA: Diagnosis not present

## 2019-10-07 DIAGNOSIS — R109 Unspecified abdominal pain: Secondary | ICD-10-CM

## 2019-10-07 MED ORDER — HYOSCYAMINE SULFATE 0.125 MG SL SUBL: SUBLINGUAL_TABLET | SUBLINGUAL | 0 refills | Status: DC

## 2019-10-07 MED FILL — OSCIMIN SL 0.125 MG TABLET: 0.125 | 30 days supply | Qty: 30 | Fill #0

## 2019-10-07 NOTE — Progress Notes (Signed)
Paul Sawyer    [id number]    June 21, 1971  Primary Care Physician:Hall, Edwinna Areola, MD  Referring Physician: Celene Squibb, MD [address],  [place] [address]   Chief complaint:  RUQ abd pain  HPI: 49 year old male here for follow-up visit for right upper quadrant abdominal pain  EGD September 22, 2019: S/p gastric bypass anatomy otherwise unremarkable exam Colonoscopy 09/22/19: 74mm polyp [hyperplastic] in the rectum, internal hemorrhoids  Gastric bypass 15 years ago, lost around 200 lbs after surgery  S/p Cholecystectomy about 16 years ago for dyskinesia  RUQ ultrasound 09/03/2019: s/p cholecystectomy, no biliary dilation.   He has chronic back pain and is on Suboxone  No family history of GI malignancy, IBD or celiac disease.  No melena or blood per rectum. Denies NSAID use.  Right upper quadrant pain is exacerbated with alcohol use or postprandial.  He is worried about [name] as he was told he had fatty liver previously though most recent right upper quadrant ultrasound is negative for fatty liver and AST/ALT are within normal range  Outpatient Encounter Medications as of 10/07/2019  Medication Sig  . Buprenorphine HCl-Naloxone HCl 8-2 MG FILM   . cyclobenzaprine (FLEXERIL) 10 MG tablet    No facility-administered encounter medications on file as of 10/07/2019.    Allergies as of 10/07/2019 - Review Complete 10/07/2019  Allergen Reaction Noted  . Ciprofloxacin  07/07/2007  . Nsaids Nausea And Vomiting 03/19/2019    Past Medical History:  Diagnosis Date  . PAD (peripheral artery disease) ([place])   . Varicose veins of both lower extremities     Past Surgical History:  Procedure Laterality Date  . CHOLECYSTECTOMY  2004  . GASTRIC BYPASS  2005    Family History  Problem Relation Age of Onset  . Diabetes Father   . Heart disease Father   . Colon cancer Neg Hx   . Esophageal cancer Neg Hx   . Rectal cancer Neg Hx   . Stomach cancer  Neg Hx     Social History   Socioeconomic History  . Marital status: Married    Spouse name: Not on file  . Number of children: Not on file  . Years of education: Not on file  . Highest education level: Not on file  Occupational History  . Not on file  Tobacco Use  . Smoking status: Never Smoker  . Smokeless tobacco: Never Used  Substance and Sexual Activity  . Alcohol use: Yes    Alcohol/week: 1.0 standard drinks    Types: 1 Glasses of wine per week    Comment: weekly  . Drug use: No  . Sexual activity: Not on file  Other Topics Concern  . Not on file  Social History Narrative  . Not on file   Social Determinants of Health   Financial Resource Strain:   . Difficulty of Paying Living Expenses: Not on file  Food Insecurity:   . Worried About Charity fundraiser in the Last Year: Not on file  . Ran Out of Food in the Last Year: Not on file  Transportation Needs:   . Lack of Transportation (Medical): Not on file  . Lack of Transportation (Non-Medical): Not on file  Physical Activity:   . Days of Exercise per Week: Not on file  . Minutes of Exercise per Session: Not on file  Stress:   . Feeling of Stress : Not on file  Social Connections:   . Frequency of Communication with Friends and Family: Not on file  . Frequency of Social Gatherings with Friends and Family: Not on file  . Attends Religious Services: Not on file  . Active Member of Clubs or Organizations: Not on file  . Attends Archivist Meetings: Not on file  . Marital Status: Not on file  Intimate Partner Violence:   . Fear of Current or Ex-Partner: Not on file  . Emotionally Abused: Not on file  . Physically Abused: Not on file  . Sexually Abused: Not on file      Review of systems: All other review of systems negative except as mentioned in the HPI.   Physical Exam: Vitals:   10/07/19 0835  BP: 126/62  Pulse: 72  Temp: 98.3 F (36.8 C)   Body mass index is 25.76 kg/m. Gen:       No acute distress Neuro: alert and oriented x 3 Psych: normal mood and affect  Data Reviewed:  Reviewed labs, radiology imaging, old records and pertinent past GI work up   Assessment and Plan/Recommendations:  49 year old male with history of cholecystectomy, s/p gastric bypass with chronic right upper quadrant discomfort  Patient is worried about [name] Reassured him and advised him to continue to avoid alcohol and simple carbohydrates/sugars Continue high-fiber diet with high-protein, regular exercise  Differential includes IBS, functional abdominal pain, small intestinal bacterial overgrowth, fatty liver or [name] though seems less likely given most recent ultrasound and transaminases are within normal range  Will obtain right upper quadrant ultrasound with elastography to assess for hepatic fibrosis and to reassure patient  Mild elevation in total bilirubin, chronic is likely secondary to Gilbert's syndrome.  Reassured patient.  Avoid daily use of probiotic as can potentially lead to small intestinal bacterial overgrowth  Use sublingual Levsin 1 tablet daily as needed for severe discomfort or abdominal cramping.  Due for colonoscopy for colorectal cancer screening in January 2031  Return in 6 months or sooner if needed  This visit required 40 minutes of patient care (this includes precharting, chart review, review of results, face-to-face time used for counseling as well as treatment plan and follow-up. The patient was provided an opportunity to ask questions and all were answered. The patient agreed with the plan and demonstrated an understanding of the instructions.  Damaris Hippo , MD    CC: Celene Squibb, MD ]

## 2019-10-07 NOTE — Patient Instructions (Addendum)
If you are age 51 or older, your body mass index should be between 23-30. Your Body mass index is 25.76 kg/m. If this is out of the aforementioned range listed, please consider follow up with your Primary Care Provider.  If you are age 70 or younger, your body mass index should be between 19-25. Your Body mass index is 25.76 kg/m. If this is out of the aformentioned range listed, please consider follow up with your Primary Care Provider.   You have been scheduled for an abdominal ultrasound at [facility] (1st floor of hospital) on 10-21-2019 at 11am. Please arrive 15 minutes prior to your appointment for registration. Make certain not to have anything to eat or drink 6 hours prior to your appointment. Should you need to reschedule your appointment, please contact radiology at [phone]. This test typically takes about 30 minutes to perform.  It was a pleasure to see you today!  Dr. Silverio Decamp

## 2019-10-12 ENCOUNTER — Ambulatory Visit (HOSPITAL_COMMUNITY)
Admission: RE | Admit: 2019-10-12 | Discharge: 2019-10-12 | Disposition: A | Discharge status: Home / Self Care | Payer: 59 | Source: Ambulatory Visit | Attending: Gastroenterology | Admitting: Gastroenterology

## 2019-10-12 ENCOUNTER — Other Ambulatory Visit: Payer: Self-pay

## 2019-10-12 ENCOUNTER — Other Ambulatory Visit (HOSPITAL_COMMUNITY): Payer: 59

## 2019-10-12 DIAGNOSIS — K760 Fatty (change of) liver, not elsewhere classified: Secondary | ICD-10-CM | POA: Diagnosis not present

## 2019-10-12 DIAGNOSIS — R1011 Right upper quadrant pain: Secondary | ICD-10-CM | POA: Diagnosis not present

## 2019-10-12 DIAGNOSIS — K589 Irritable bowel syndrome without diarrhea: Secondary | ICD-10-CM | POA: Insufficient documentation

## 2019-10-19 MED FILL — BUPRENORPHIN-NALOXON 8-2 MG: 8-2 | 28 days supply | Qty: 28 | Fill #0

## 2019-10-25 ENCOUNTER — Ambulatory Visit
Admission: EM | Admit: 2019-10-25 | Discharge: 2019-10-25 | Disposition: A | Discharge status: Home / Self Care | Payer: 59 | Attending: Family Medicine | Admitting: Family Medicine

## 2019-10-25 ENCOUNTER — Other Ambulatory Visit: Payer: Self-pay

## 2019-10-25 ENCOUNTER — Encounter: Payer: Self-pay | Admitting: Emergency Medicine

## 2019-10-25 DIAGNOSIS — W57XXXA Bitten or stung by nonvenomous insect and other nonvenomous arthropods, initial encounter: Secondary | ICD-10-CM

## 2019-10-25 DIAGNOSIS — Z5189 Encounter for other specified aftercare: Secondary | ICD-10-CM | POA: Diagnosis not present

## 2019-10-25 MED ORDER — TRIAMCINOLONE ACETONIDE 0.1 % EX CREA: 1.0000 "application " | TOPICAL_CREAM | Freq: Two times a day (BID) | CUTANEOUS | 0 refills | Status: DC

## 2019-10-25 MED ORDER — DOXYCYCLINE HYCLATE 100 MG PO CAPS: 100.0000 mg | ORAL_CAPSULE | Freq: Two times a day (BID) | ORAL | 0 refills | Status: AC

## 2019-10-25 NOTE — Discharge Instructions (Signed)
Begin doxycycline twice daily for the next 2 weeks Triamcinolone cream topically twice daily to area Follow up if not improving or worsening or developing any other concerning symptoms

## 2019-10-25 NOTE — ED Triage Notes (Signed)
Pt here for tick bite to left inner thigh that is now red and painful; pt sts pulled tick off yesterday

## 2019-10-26 MED FILL — TRIAMCINOLONE ACETONIDE 0.1: 0.1 | 10 days supply | Qty: 45 | Fill #0

## 2019-10-26 MED FILL — DOXYCYCLINE HYCLATE 100 MG: 100 | 14 days supply | Qty: 28 | Fill #0

## 2019-10-26 NOTE — ED Provider Notes (Signed)
[facility]    CSN: [id number] Arrival date & time: 10/25/19  1633      History   Chief Complaint Chief Complaint  Patient presents with  . Wound Check    HPI Paul Sawyer is a 49 y.o. male history of PAD, presenting today for evaluation of a tick bite.  Patient noticed that he had a tick bite on Saturday/2 days ago.  He removed the tick immediately after.  He sustained a bite to his right lower abdomen as well as his left medial thigh.  His main concern is the redness and swelling associated with the bite on his left thigh.  He has had a lot of associated itching, but has a lot of redness that has been extending out beyond that bite.  He is unsure if he fully removed to tic from this area.  Denies similar reactions with prior tick bites.  Denies fevers, headaches, dizziness or lightheadedness.  Denies nausea or vomiting.  He has not noted any other rashes.  HPI  Past Medical History:  Diagnosis Date  . PAD (peripheral artery disease) ([place])   . Varicose veins of both lower extremities     Patient Active Problem List   Diagnosis Date Noted  . Varicose veins of both lower extremities with pain 08/22/2017  . Chronic venous insufficiency 08/22/2017    Past Surgical History:  Procedure Laterality Date  . CHOLECYSTECTOMY  2004  . GASTRIC BYPASS  2005       Home Medications    Prior to Admission medications   Medication Sig Start Date End Date Taking? Authorizing Provider  Buprenorphine HCl-Naloxone HCl 8-2 MG FILM  11/21/17   [provider]  cyclobenzaprine (FLEXERIL) 10 MG tablet  09/25/17   [provider]  doxycycline (VIBRAMYCIN) 100 MG capsule Take 1 capsule (100 mg total) by mouth 2 (two) times daily for 14 days. 10/25/19 11/08/19  Luka Reisch C, PA-C  hyoscyamine (LEVSIN SL) 0.125 MG SL tablet Once a day as needed 10/07/19   Nandigam, Venia Minks, MD  triamcinolone cream (KENALOG) 0.1 % Apply 1 application topically 2 (two) times  daily. 10/25/19   F[place]er [place], Elesa Hacker, PA-C    Family History Family History  Problem Relation Age of Onset  . Diabetes Father   . Heart disease Father   . Colon cancer Neg Hx   . Esophageal cancer Neg Hx   . Rectal cancer Neg Hx   . Stomach cancer Neg Hx     Social History Social History   Tobacco Use  . Smoking status: Never Smoker  . Smokeless tobacco: Never Used  Substance Use Topics  . Alcohol use: Yes    Alcohol/week: 1.0 standard drinks    Types: 1 Glasses of wine per week    Comment: weekly  . Drug use: No     Allergies   Ciprofloxacin and Nsaids   Review of Systems Review of Systems  Constitutional: Negative for fatigue and fever.  Eyes: Negative for redness, itching and visual disturbance.  Respiratory: Negative for shortness of breath.   Cardiovascular: Negative for chest pain and leg swelling.  Gastrointestinal: Negative for nausea and vomiting.  Musculoskeletal: Negative for arthralgias and myalgias.  Skin: Positive for color change and rash. Negative for wound.  Neurological: Negative for dizziness, syncope, weakness, light-headedness and headaches.     Physical Exam Triage Vital Signs ED Triage Vitals [10/25/19 1713]  Enc Vitals Group     BP (!) 150/67  Pulse Rate 73     Resp 18     Temp 98.6 F (37 C)     Temp Source Oral     SpO2 95 %     Weight      Height      Head Circumference      Peak Flow      Pain Score 5     Pain Loc      Pain Edu?      Excl. in [place]?    No data found.  Updated Vital Signs BP (!) 150/67 (BP Location: Right Arm)   Pulse 73   Temp 98.6 F (37 C) (Oral)   Resp 18   SpO2 95%   Visual Acuity Right Eye Distance:   Left Eye Distance:   Bilateral Distance:    Right Eye Near:   Left Eye Near:    Bilateral Near:     Physical Exam Vitals and nursing note reviewed.  Constitutional:      Appearance: He is well-developed.     Comments: No acute distress  HENT:     Head: Normocephalic and atraumatic.       Nose: Nose normal.  Eyes:     Conjunctiva/sclera: Conjunctivae normal.  Cardiovascular:     Rate and Rhythm: Normal rate.  Pulmonary:     Effort: Pulmonary effort is normal. No respiratory distress.  Abdominal:     General: There is no distension.  Musculoskeletal:        General: Normal range of motion.     Cervical back: Neck supple.  Skin:    General: Skin is warm and dry.     Comments: Right lower abdomen with punctate lesion with surrounding deep erythema extending approximately 0.25 cm circumferentially.  Left medial thigh with central area of deep erythema with surrounding faint erythema extending outward, central induration noted  Neurological:     Mental Status: He is alert and oriented to person, place, and time.      UC Treatments / Results  Labs (all labs ordered are listed, but only abnormal results are displayed) Labs Reviewed - No data to display  EKG   Radiology No results found.  Procedures Procedures (including critical care time)  Medications Ordered in UC Medications - No data to display  Initial Impression / Assessment and Plan / UC Course  I have reviewed the triage vital signs and the nursing notes.  Pertinent labs & imaging results that were available during my care of the patient were reviewed by me and considered in my medical decision making (see chart for details).     Tick bite with surrounding erythema, likely local inflammation, but also concerning for infection/cellulitis.  Initiating on doxycycline, triamcinolone to apply topically to help with local inflammation/itching.  No systemic symptoms or signs of Lyme/RMSF at this time.  Discussed signs to watch out for,Discussed strict return precautions. Patient verbalized understanding and is agreeable with plan.   Final Clinical Impressions(s) / UC Diagnoses   Final diagnoses:  Tick bite, initial encounter     Discharge Instructions     Begin doxycycline twice daily for the  next 2 weeks Triamcinolone cream topically twice daily to area Follow up if not improving or worsening or developing any other concerning symptoms    ED Prescriptions    Medication Sig Dispense Auth. Provider   doxycycline (VIBRAMYCIN) 100 MG capsule Take 1 capsule (100 mg total) by mouth 2 (two) times daily for 14 days. 28 capsule Labria Wos,  Talayah Picardi C, PA-C   triamcinolone cream (KENALOG) 0.1 % Apply 1 application topically 2 (two) times daily. 45 g Erion Hermans, [place] C, PA-C     PDMP not reviewed this encounter.   Janith Lima, [place] 10/26/19 [phone]

## 2019-11-05 MED FILL — CYCLOBENZAPRINE HCL 10 MG T: 10 | 20 days supply | Qty: 60 | Fill #0

## 2019-11-15 MED FILL — BUPRENORPHIN-NALOXON 8-2 MG: 8-2 | 28 days supply | Qty: 28 | Fill #1

## 2019-11-25 DIAGNOSIS — I8311 Varicose veins of right lower extremity with inflammation: Secondary | ICD-10-CM | POA: Diagnosis not present

## 2019-11-25 DIAGNOSIS — I8312 Varicose veins of left lower extremity with inflammation: Secondary | ICD-10-CM | POA: Diagnosis not present

## 2019-11-25 DIAGNOSIS — I83893 Varicose veins of bilateral lower extremities with other complications: Secondary | ICD-10-CM | POA: Diagnosis not present

## 2019-11-25 DIAGNOSIS — I83813 Varicose veins of bilateral lower extremities with pain: Secondary | ICD-10-CM | POA: Diagnosis not present

## 2019-12-13 MED FILL — CYCLOBENZAPRINE HCL 10 MG T: 10 | 20 days supply | Qty: 60 | Fill #1

## 2019-12-13 MED FILL — BUPREN-NALOX SL TAB 8-2MG: 8-2 | 28 days supply | Qty: 28 | Fill #2

## 2019-12-21 DIAGNOSIS — I83813 Varicose veins of bilateral lower extremities with pain: Secondary | ICD-10-CM | POA: Diagnosis not present

## 2019-12-21 DIAGNOSIS — I83893 Varicose veins of bilateral lower extremities with other complications: Secondary | ICD-10-CM | POA: Diagnosis not present

## 2019-12-21 DIAGNOSIS — I8312 Varicose veins of left lower extremity with inflammation: Secondary | ICD-10-CM | POA: Diagnosis not present

## 2019-12-21 DIAGNOSIS — I8311 Varicose veins of right lower extremity with inflammation: Secondary | ICD-10-CM | POA: Diagnosis not present

## 2019-12-27 DIAGNOSIS — F1120 Opioid dependence, uncomplicated: Secondary | ICD-10-CM | POA: Insufficient documentation

## 2019-12-27 DIAGNOSIS — M545 Low back pain, unspecified: Secondary | ICD-10-CM | POA: Insufficient documentation

## 2019-12-27 DIAGNOSIS — Z79891 Long term (current) use of opiate analgesic: Secondary | ICD-10-CM | POA: Insufficient documentation

## 2019-12-29 DIAGNOSIS — I8311 Varicose veins of right lower extremity with inflammation: Secondary | ICD-10-CM | POA: Diagnosis not present

## 2019-12-29 DIAGNOSIS — I8312 Varicose veins of left lower extremity with inflammation: Secondary | ICD-10-CM | POA: Diagnosis not present

## 2019-12-29 DIAGNOSIS — I83893 Varicose veins of bilateral lower extremities with other complications: Secondary | ICD-10-CM | POA: Diagnosis not present

## 2019-12-29 DIAGNOSIS — I83813 Varicose veins of bilateral lower extremities with pain: Secondary | ICD-10-CM | POA: Diagnosis not present

## 2020-01-03 DIAGNOSIS — G894 Chronic pain syndrome: Secondary | ICD-10-CM | POA: Diagnosis not present

## 2020-01-03 DIAGNOSIS — F1129 Opioid dependence with unspecified opioid-induced disorder: Secondary | ICD-10-CM | POA: Diagnosis not present

## 2020-01-03 DIAGNOSIS — Z79891 Long term (current) use of opiate analgesic: Secondary | ICD-10-CM | POA: Diagnosis not present

## 2020-01-03 DIAGNOSIS — Z79899 Other long term (current) drug therapy: Secondary | ICD-10-CM | POA: Diagnosis not present

## 2020-01-03 DIAGNOSIS — M79605 Pain in left leg: Secondary | ICD-10-CM | POA: Diagnosis not present

## 2020-01-03 DIAGNOSIS — M545 Low back pain: Secondary | ICD-10-CM | POA: Diagnosis not present

## 2020-01-03 DIAGNOSIS — F1120 Opioid dependence, uncomplicated: Secondary | ICD-10-CM | POA: Diagnosis not present

## 2020-01-03 MED FILL — GABAPENTIN 300 MG CAPSULE: 300 | 30 days supply | Qty: 90 | Fill #0

## 2020-01-03 MED FILL — DULoxetine HCL 30 MG CPEP: 30 | 30 days supply | Qty: 60 | Fill #0

## 2020-01-03 MED FILL — LIDOCAINE PATCH 5%: 5 | 30 days supply | Qty: 30 | Fill #0

## 2020-01-06 MED FILL — ALPRAZolam 0.5 MG TABS: 0.5 | 1 days supply | Qty: 4 | Fill #0

## 2020-01-19 DIAGNOSIS — I8312 Varicose veins of left lower extremity with inflammation: Secondary | ICD-10-CM | POA: Diagnosis not present

## 2020-01-19 DIAGNOSIS — I83812 Varicose veins of left lower extremities with pain: Secondary | ICD-10-CM | POA: Diagnosis not present

## 2020-01-24 DIAGNOSIS — I8312 Varicose veins of left lower extremity with inflammation: Secondary | ICD-10-CM | POA: Diagnosis not present

## 2020-01-31 DIAGNOSIS — I83812 Varicose veins of left lower extremities with pain: Secondary | ICD-10-CM | POA: Diagnosis not present

## 2020-01-31 DIAGNOSIS — I8312 Varicose veins of left lower extremity with inflammation: Secondary | ICD-10-CM | POA: Diagnosis not present

## 2020-02-07 DIAGNOSIS — I8312 Varicose veins of left lower extremity with inflammation: Secondary | ICD-10-CM | POA: Diagnosis not present

## 2020-02-07 MED FILL — BUPREN-NALOX SL TAB 8-2MG: 8-2 | 30 days supply | Qty: 30 | Fill #0

## 2020-02-10 ENCOUNTER — Other Ambulatory Visit: Payer: Self-pay

## 2020-02-10 ENCOUNTER — Ambulatory Visit (INDEPENDENT_AMBULATORY_CARE_PROVIDER_SITE_OTHER): Payer: 59 | Admitting: Podiatry

## 2020-02-10 VITALS — Temp 97.2°F

## 2020-02-10 DIAGNOSIS — M21961 Unspecified acquired deformity of right lower leg: Secondary | ICD-10-CM

## 2020-02-10 DIAGNOSIS — B351 Tinea unguium: Secondary | ICD-10-CM | POA: Diagnosis not present

## 2020-02-10 DIAGNOSIS — B353 Tinea pedis: Secondary | ICD-10-CM | POA: Diagnosis not present

## 2020-02-10 DIAGNOSIS — I8312 Varicose veins of left lower extremity with inflammation: Secondary | ICD-10-CM | POA: Diagnosis not present

## 2020-02-10 DIAGNOSIS — Q6672 Congenital pes cavus, left foot: Secondary | ICD-10-CM

## 2020-02-10 DIAGNOSIS — M21962 Unspecified acquired deformity of left lower leg: Secondary | ICD-10-CM

## 2020-02-10 DIAGNOSIS — Q6671 Congenital pes cavus, right foot: Secondary | ICD-10-CM

## 2020-02-10 MED ORDER — FLUCONAZOLE 150 MG PO TABS: 150.0000 mg | ORAL_TABLET | ORAL | 2 refills | Status: DC

## 2020-02-10 MED FILL — FLUCONAZOLE 150 MG TABS: 150 | 28 days supply | Qty: 4 | Fill #0

## 2020-02-10 NOTE — Progress Notes (Signed)
  Subjective:  Patient ID: Paul Sawyer, male    DOB: 1970/10/08,  MRN: [id number]  Chief Complaint  Patient presents with  . Nail Problem    Thickened, long, discolored toenails - bilateral hallux and 2nd toes per pt. Tried unspecified OTC treatments.  . Skin Problem    Painful lesions. R plantar forefoot submet 4, L plantar forefoot submet 3 and 5. Tried OTC "acid" treatment.   49 y.o. male presents with the above complaint. History confirmed with patient. Hx of clubfoot corrected as child.  Objective:  Physical Exam: warm, good capillary refill, no trophic changes or ulcerative lesions, normal DP and PT pulses and normal sensory exam. Xerosis with scaling bilat. Nails dystrophic with yellow discoloration. Left Foot: HPK submet 5.  Right Foot: HPK submet 3,5   Assessment:   1. Onychomycosis   2. Tinea pedis of both feet   3. Deformity of metatarsal bone of left foot   4. Deformity of metatarsal, right   5. Pes cavus of right foot   6. Congenital pes cavus, left foot    Plan:  Patient was evaluated and treated and all questions answered.  Onychomycosis, Tinea pedis -Educated on etiology of nail fungus. -eRx fluconazole weekly  HPKs, Metatarsal Deormity, Hx of Clubfoot -Would benefit from CMOs. Casted today. -Offered debridement, declined  Return in about 2 months (around 04/12/2020) for Nail Fungus.

## 2020-02-21 MED FILL — ALPRAZolam 0.5 MG TABS: 0.5 | 1 days supply | Qty: 4 | Fill #0

## 2020-02-23 MED FILL — CYCLOBENZAPRINE HCL 10 MG T: 10 | 20 days supply | Qty: 60 | Fill #2

## 2020-03-02 ENCOUNTER — Other Ambulatory Visit: Payer: Self-pay

## 2020-03-02 ENCOUNTER — Ambulatory Visit: Payer: 59 | Admitting: Orthotics

## 2020-03-02 DIAGNOSIS — B353 Tinea pedis: Secondary | ICD-10-CM

## 2020-03-02 DIAGNOSIS — M21962 Unspecified acquired deformity of left lower leg: Secondary | ICD-10-CM

## 2020-03-02 DIAGNOSIS — B351 Tinea unguium: Secondary | ICD-10-CM

## 2020-03-02 NOTE — Progress Notes (Signed)
Patient came in today to pick up custom made foot orthotics.  The goals were accomplished and the patient reported no dissatisfaction with said orthotics.  Patient was advised of breakin period and how to report any issues. 

## 2020-03-07 MED FILL — FLUCONAZOLE 150 MG TABS: 150 | 28 days supply | Qty: 4 | Fill #1

## 2020-03-07 MED FILL — BUPREN-NALOX SL TAB 8-2MG: 8-2 | 30 days supply | Qty: 30 | Fill #1

## 2020-03-27 ENCOUNTER — Other Ambulatory Visit (HOSPITAL_COMMUNITY): Payer: Self-pay | Admitting: Neurology

## 2020-03-27 DIAGNOSIS — M545 Low back pain, unspecified: Secondary | ICD-10-CM

## 2020-03-27 DIAGNOSIS — G894 Chronic pain syndrome: Secondary | ICD-10-CM | POA: Diagnosis not present

## 2020-03-27 DIAGNOSIS — F1120 Opioid dependence, uncomplicated: Secondary | ICD-10-CM | POA: Diagnosis not present

## 2020-03-27 DIAGNOSIS — M79605 Pain in left leg: Secondary | ICD-10-CM | POA: Diagnosis not present

## 2020-03-27 DIAGNOSIS — Z79891 Long term (current) use of opiate analgesic: Secondary | ICD-10-CM | POA: Diagnosis not present

## 2020-03-27 MED FILL — GABAPENTIN 300 MG CAPSULE: 300 | 30 days supply | Qty: 90 | Fill #0

## 2020-03-27 MED FILL — CYCLOBENZAPRINE HCL 10 MG T: 10 | 20 days supply | Qty: 60 | Fill #3

## 2020-03-31 MED FILL — FLUCONAZOLE 150 MG TABS: 150 | 28 days supply | Qty: 4 | Fill #2

## 2020-04-10 MED FILL — FLUCONAZOLE 150 MG TABS: 150 | 28 days supply | Qty: 4 | Fill #2

## 2020-04-12 DIAGNOSIS — I8312 Varicose veins of left lower extremity with inflammation: Secondary | ICD-10-CM | POA: Diagnosis not present

## 2020-04-14 ENCOUNTER — Other Ambulatory Visit: Payer: Self-pay

## 2020-04-14 ENCOUNTER — Ambulatory Visit (INDEPENDENT_AMBULATORY_CARE_PROVIDER_SITE_OTHER): Payer: 59 | Admitting: Podiatry

## 2020-04-14 ENCOUNTER — Encounter: Payer: Self-pay | Admitting: Podiatry

## 2020-04-14 DIAGNOSIS — B351 Tinea unguium: Secondary | ICD-10-CM | POA: Diagnosis not present

## 2020-04-14 DIAGNOSIS — M9902 Segmental and somatic dysfunction of thoracic region: Secondary | ICD-10-CM | POA: Diagnosis not present

## 2020-04-14 DIAGNOSIS — M545 Low back pain: Secondary | ICD-10-CM | POA: Diagnosis not present

## 2020-04-14 DIAGNOSIS — B353 Tinea pedis: Secondary | ICD-10-CM | POA: Diagnosis not present

## 2020-04-14 DIAGNOSIS — M4004 Postural kyphosis, thoracic region: Secondary | ICD-10-CM | POA: Diagnosis not present

## 2020-04-14 DIAGNOSIS — M9903 Segmental and somatic dysfunction of lumbar region: Secondary | ICD-10-CM | POA: Diagnosis not present

## 2020-04-14 DIAGNOSIS — M9901 Segmental and somatic dysfunction of cervical region: Secondary | ICD-10-CM | POA: Diagnosis not present

## 2020-04-14 DIAGNOSIS — M4125 Other idiopathic scoliosis, thoracolumbar region: Secondary | ICD-10-CM | POA: Diagnosis not present

## 2020-04-14 MED ORDER — FLUCONAZOLE 150 MG PO TABS: 150.0000 mg | ORAL_TABLET | ORAL | 1 refills | Status: DC

## 2020-04-14 NOTE — Progress Notes (Signed)
  Subjective:  Patient ID: Paul Sawyer, male    DOB: [birth year],  MRN: [id number]  No chief complaint on file.  49 y.o. male presents for f/u. Thinks the athlete's foot is a lot better. Can't tell a difference with the nails. Being treated for Venous insufficiency bilat.  Objective:  Physical Exam: warm, good capillary refill, no trophic changes or ulcerative lesions, normal DP and PT pulses and normal sensory exam. Xerosis without scaling bilat. Nails dystrophic with yellow discoloration but clear new growth. Left Foot: HPK submet 5.  Right Foot: HPK submet 3,5   Assessment:   1. Onychomycosis   2. Tinea pedis of both feet    Plan:  Patient was evaluated and treated and all questions answered.  Onychomycosis, Tinea pedis -Tinea pedis resovled -Nail fungus improving -Refill fluconazole for 2 more months.  HPKs, Metatarsal Deormity, Hx of Clubfoot -Improving with CMOs  Return in about 2 months (around 06/14/2020) for Nail Fungus.

## 2020-04-18 DIAGNOSIS — M545 Low back pain: Secondary | ICD-10-CM | POA: Diagnosis not present

## 2020-04-18 DIAGNOSIS — M4125 Other idiopathic scoliosis, thoracolumbar region: Secondary | ICD-10-CM | POA: Diagnosis not present

## 2020-04-18 DIAGNOSIS — M9902 Segmental and somatic dysfunction of thoracic region: Secondary | ICD-10-CM | POA: Diagnosis not present

## 2020-04-18 DIAGNOSIS — M4004 Postural kyphosis, thoracic region: Secondary | ICD-10-CM | POA: Diagnosis not present

## 2020-04-18 DIAGNOSIS — M9901 Segmental and somatic dysfunction of cervical region: Secondary | ICD-10-CM | POA: Diagnosis not present

## 2020-04-18 DIAGNOSIS — M9903 Segmental and somatic dysfunction of lumbar region: Secondary | ICD-10-CM | POA: Diagnosis not present

## 2020-04-24 DIAGNOSIS — M9902 Segmental and somatic dysfunction of thoracic region: Secondary | ICD-10-CM | POA: Diagnosis not present

## 2020-04-24 DIAGNOSIS — M545 Low back pain: Secondary | ICD-10-CM | POA: Diagnosis not present

## 2020-04-24 DIAGNOSIS — M4125 Other idiopathic scoliosis, thoracolumbar region: Secondary | ICD-10-CM | POA: Diagnosis not present

## 2020-04-24 DIAGNOSIS — M9903 Segmental and somatic dysfunction of lumbar region: Secondary | ICD-10-CM | POA: Diagnosis not present

## 2020-04-24 DIAGNOSIS — M4004 Postural kyphosis, thoracic region: Secondary | ICD-10-CM | POA: Diagnosis not present

## 2020-04-24 DIAGNOSIS — M9901 Segmental and somatic dysfunction of cervical region: Secondary | ICD-10-CM | POA: Diagnosis not present

## 2020-04-24 MED FILL — CYCLOBENZAPRINE HCL 10 MG T: 10 | 20 days supply | Qty: 60 | Fill #4

## 2020-04-26 ENCOUNTER — Other Ambulatory Visit (HOSPITAL_COMMUNITY): Payer: Self-pay | Admitting: Family Medicine

## 2020-04-27 DIAGNOSIS — I824Z2 Acute embolism and thrombosis of unspecified deep veins of left distal lower extremity: Secondary | ICD-10-CM | POA: Diagnosis not present

## 2020-04-27 MED FILL — ELIQUIS 5 MG TABLET: 5 | 30 days supply | Qty: 74 | Fill #0

## 2020-05-02 MED FILL — BUPREN-NALOX SL TAB 8-2MG: 8-2 | 30 days supply | Qty: 30 | Fill #2

## 2020-05-02 MED FILL — FLUCONAZOLE 150 MG TABS: 150 | 28 days supply | Qty: 4 | Fill #0

## 2020-05-04 DIAGNOSIS — I8311 Varicose veins of right lower extremity with inflammation: Secondary | ICD-10-CM | POA: Diagnosis not present

## 2020-05-12 DIAGNOSIS — I824Z2 Acute embolism and thrombosis of unspecified deep veins of left distal lower extremity: Secondary | ICD-10-CM | POA: Diagnosis not present

## 2020-05-12 DIAGNOSIS — M7981 Nontraumatic hematoma of soft tissue: Secondary | ICD-10-CM | POA: Diagnosis not present

## 2020-05-12 DIAGNOSIS — I83893 Varicose veins of bilateral lower extremities with other complications: Secondary | ICD-10-CM | POA: Diagnosis not present

## 2020-05-12 DIAGNOSIS — I83812 Varicose veins of left lower extremities with pain: Secondary | ICD-10-CM | POA: Diagnosis not present

## 2020-05-12 DIAGNOSIS — I8312 Varicose veins of left lower extremity with inflammation: Secondary | ICD-10-CM | POA: Diagnosis not present

## 2020-05-17 DIAGNOSIS — I8311 Varicose veins of right lower extremity with inflammation: Secondary | ICD-10-CM | POA: Diagnosis not present

## 2020-05-31 MED FILL — ELIQUIS 5 MG TABLET: 5 | 30 days supply | Qty: 60 | Fill #0

## 2020-05-31 MED FILL — FLUCONAZOLE 150 MG TABS: 150 | 28 days supply | Qty: 4 | Fill #1

## 2020-05-31 MED FILL — BUPREN-NALOX SL TAB 8-2MG: 8-2 | 30 days supply | Qty: 30 | Fill #0

## 2020-06-07 DIAGNOSIS — I8312 Varicose veins of left lower extremity with inflammation: Secondary | ICD-10-CM | POA: Diagnosis not present

## 2020-06-07 DIAGNOSIS — I8311 Varicose veins of right lower extremity with inflammation: Secondary | ICD-10-CM | POA: Diagnosis not present

## 2020-06-09 DIAGNOSIS — I8312 Varicose veins of left lower extremity with inflammation: Secondary | ICD-10-CM | POA: Diagnosis not present

## 2020-06-09 DIAGNOSIS — I83812 Varicose veins of left lower extremities with pain: Secondary | ICD-10-CM | POA: Diagnosis not present

## 2020-06-13 ENCOUNTER — Other Ambulatory Visit: Payer: Self-pay | Admitting: Podiatry

## 2020-06-13 MED FILL — CYCLOBENZAPRINE HCL 10 MG T: 10 | 20 days supply | Qty: 60 | Fill #5

## 2020-06-13 NOTE — Telephone Encounter (Signed)
Please advise 

## 2020-06-14 ENCOUNTER — Other Ambulatory Visit: Payer: Self-pay | Admitting: Podiatry

## 2020-06-16 ENCOUNTER — Ambulatory Visit: Payer: 59 | Admitting: Podiatry

## 2020-06-20 DIAGNOSIS — I83811 Varicose veins of right lower extremities with pain: Secondary | ICD-10-CM | POA: Diagnosis not present

## 2020-06-20 DIAGNOSIS — I8311 Varicose veins of right lower extremity with inflammation: Secondary | ICD-10-CM | POA: Diagnosis not present

## 2020-06-24 MED FILL — ELIQUIS 5 MG TABLET: 5 | 30 days supply | Qty: 60 | Fill #1

## 2020-06-26 MED FILL — BUPREN-NALOX SL TAB 8-2MG: 8-2 | 30 days supply | Qty: 30 | Fill #1

## 2020-06-28 ENCOUNTER — Other Ambulatory Visit (HOSPITAL_COMMUNITY): Payer: Self-pay | Admitting: Neurology

## 2020-06-28 DIAGNOSIS — G894 Chronic pain syndrome: Secondary | ICD-10-CM | POA: Diagnosis not present

## 2020-06-28 DIAGNOSIS — M79605 Pain in left leg: Secondary | ICD-10-CM | POA: Diagnosis not present

## 2020-06-28 DIAGNOSIS — M545 Low back pain, unspecified: Secondary | ICD-10-CM | POA: Diagnosis not present

## 2020-06-28 DIAGNOSIS — M5459 Other low back pain: Secondary | ICD-10-CM | POA: Diagnosis not present

## 2020-06-28 DIAGNOSIS — F1120 Opioid dependence, uncomplicated: Secondary | ICD-10-CM | POA: Diagnosis not present

## 2020-06-28 DIAGNOSIS — Z79891 Long term (current) use of opiate analgesic: Secondary | ICD-10-CM | POA: Diagnosis not present

## 2020-06-28 MED FILL — FLUCONAZOLE 150 MG TABS: 150 | 28 days supply | Qty: 4 | Fill #0

## 2020-06-28 MED FILL — GABAPENTIN 300 MG CAPSULE: 300 | 30 days supply | Qty: 90 | Fill #0

## 2020-07-10 ENCOUNTER — Other Ambulatory Visit (HOSPITAL_COMMUNITY): Payer: Self-pay | Admitting: Neurology

## 2020-07-10 MED FILL — CYCLOBENZAPRINE HCL 10 MG T: 10 | 20 days supply | Qty: 60 | Fill #0

## 2020-07-14 ENCOUNTER — Telehealth: Payer: Self-pay

## 2020-07-14 NOTE — Telephone Encounter (Signed)
Spoke with patient about his referral for TOS. He has been seeing MD at [facility], but he is unsure which doctor. They are not covered by his insurance and he would like another referral - instructed him to try and contact [facility] MD for referral, but if unable we could schedule him a f/u appt for another referral. Also discussed management of DVT. He is currently on Eliquis, and wanted to know about surgical options. Explained there were eventual surgical options if MD decided he was a canidate, but  blood thinners were first line treatment - they could take some time to be effective. Patient verbalized understanding.

## 2020-07-17 DIAGNOSIS — M4125 Other idiopathic scoliosis, thoracolumbar region: Secondary | ICD-10-CM | POA: Diagnosis not present

## 2020-07-17 DIAGNOSIS — M9903 Segmental and somatic dysfunction of lumbar region: Secondary | ICD-10-CM | POA: Diagnosis not present

## 2020-07-17 DIAGNOSIS — M4004 Postural kyphosis, thoracic region: Secondary | ICD-10-CM | POA: Diagnosis not present

## 2020-07-17 DIAGNOSIS — M9901 Segmental and somatic dysfunction of cervical region: Secondary | ICD-10-CM | POA: Diagnosis not present

## 2020-07-17 DIAGNOSIS — M9902 Segmental and somatic dysfunction of thoracic region: Secondary | ICD-10-CM | POA: Diagnosis not present

## 2020-07-18 ENCOUNTER — Ambulatory Visit: Payer: 59 | Admitting: Podiatry

## 2020-07-25 DIAGNOSIS — M531 Cervicobrachial syndrome: Secondary | ICD-10-CM | POA: Diagnosis not present

## 2020-07-25 DIAGNOSIS — Z87442 Personal history of urinary calculi: Secondary | ICD-10-CM | POA: Diagnosis not present

## 2020-07-25 DIAGNOSIS — R1030 Lower abdominal pain, unspecified: Secondary | ICD-10-CM | POA: Diagnosis not present

## 2020-07-25 DIAGNOSIS — K760 Fatty (change of) liver, not elsewhere classified: Secondary | ICD-10-CM | POA: Diagnosis not present

## 2020-07-25 DIAGNOSIS — Z86010 Personal history of colonic polyps: Secondary | ICD-10-CM | POA: Diagnosis not present

## 2020-07-25 DIAGNOSIS — Z980 Intestinal bypass and anastomosis status: Secondary | ICD-10-CM | POA: Diagnosis not present

## 2020-07-25 MED FILL — BUPREN-NALOX SL TAB 8-2MG: 8-2 | 30 days supply | Qty: 30 | Fill #2

## 2020-07-25 MED FILL — FLUCONAZOLE 150 MG TABS: 150 | 28 days supply | Qty: 4 | Fill #1

## 2020-07-25 MED FILL — ELIQUIS 5 MG TABLET: 5 | 30 days supply | Qty: 60 | Fill #2

## 2020-07-26 ENCOUNTER — Other Ambulatory Visit: Payer: Self-pay

## 2020-07-26 ENCOUNTER — Encounter (HOSPITAL_COMMUNITY): Payer: Self-pay | Admitting: Emergency Medicine

## 2020-07-26 ENCOUNTER — Other Ambulatory Visit: Payer: Self-pay | Admitting: Internal Medicine

## 2020-07-26 ENCOUNTER — Other Ambulatory Visit (HOSPITAL_COMMUNITY): Payer: Self-pay | Admitting: Internal Medicine

## 2020-07-26 ENCOUNTER — Emergency Department (HOSPITAL_COMMUNITY)
Admission: EM | Admit: 2020-07-26 | Discharge: 2020-07-26 | Disposition: A | Discharge status: Home / Self Care | Payer: 59 | Attending: Emergency Medicine | Admitting: Emergency Medicine

## 2020-07-26 ENCOUNTER — Emergency Department (HOSPITAL_COMMUNITY): Payer: 59

## 2020-07-26 DIAGNOSIS — R1030 Lower abdominal pain, unspecified: Secondary | ICD-10-CM

## 2020-07-26 DIAGNOSIS — M47816 Spondylosis without myelopathy or radiculopathy, lumbar region: Secondary | ICD-10-CM | POA: Diagnosis not present

## 2020-07-26 DIAGNOSIS — I890 Lymphedema, not elsewhere classified: Secondary | ICD-10-CM | POA: Diagnosis not present

## 2020-07-26 DIAGNOSIS — R109 Unspecified abdominal pain: Secondary | ICD-10-CM | POA: Insufficient documentation

## 2020-07-26 DIAGNOSIS — R310 Gross hematuria: Secondary | ICD-10-CM | POA: Insufficient documentation

## 2020-07-26 DIAGNOSIS — M4186 Other forms of scoliosis, lumbar region: Secondary | ICD-10-CM | POA: Diagnosis not present

## 2020-07-26 DIAGNOSIS — I700 Atherosclerosis of aorta: Secondary | ICD-10-CM | POA: Diagnosis not present

## 2020-07-26 DIAGNOSIS — N200 Calculus of kidney: Secondary | ICD-10-CM | POA: Diagnosis not present

## 2020-07-26 LAB — COMPREHENSIVE METABOLIC PANEL
ALT: 24 U/L (ref 0–44)
AST: 29 U/L (ref 15–41)
Albumin: 4.4 g/dL (ref 3.5–5.0)
Alkaline Phosphatase: 76 U/L (ref 38–126)
Anion gap: 5 (ref 5–15)
BUN: 10 mg/dL (ref 6–20)
CO2: 28 mmol/L (ref 22–32)
Calcium: 9.2 mg/dL (ref 8.9–10.3)
Chloride: 105 mmol/L (ref 98–111)
Creatinine, Ser: 0.85 mg/dL (ref 0.61–1.24)
GFR, Estimated: >60 mL/min (ref >60)
Glucose, Bld: 84 mg/dL (ref 70–99)
Potassium: 4.6 mmol/L (ref 3.5–5.1)
Sodium: 138 mmol/L (ref 135–145)
Total Bilirubin: 1.9 mg/dL — ABNORMAL HIGH (ref 0.3–1.2)
Total Protein: 7.5 g/dL (ref 6.5–8.1)

## 2020-07-26 LAB — URINALYSIS, ROUTINE W REFLEX MICROSCOPIC
Bacteria, UA: NONE SEEN
Bilirubin Urine: NEGATIVE
Glucose, UA: NEGATIVE mg/dL
Ketones, ur: NEGATIVE mg/dL
Leukocytes,Ua: NEGATIVE
Nitrite: NEGATIVE
Protein, ur: NEGATIVE mg/dL
RBC / HPF: >50 RBC/hpf — ABNORMAL HIGH (ref 0–5)
Specific Gravity, Urine: 1.014 (ref 1.005–1.030)
pH: 7 (ref 5.0–8.0)

## 2020-07-26 MED ORDER — KETOROLAC TROMETHAMINE 30 MG/ML IJ SOLN
30.0000 mg | Freq: Once | INTRAMUSCULAR | Status: DC
  Filled 2020-07-26: qty 1

## 2020-07-26 MED ORDER — OXYCODONE-ACETAMINOPHEN 5-325 MG PO TABS
1.0000 | ORAL_TABLET | Freq: Once | ORAL | Status: DC
Start: 2020-07-26 — End: 2020-07-27

## 2020-07-26 MED FILL — CYCLOBENZAPRINE HCL 10 MG T: 10 | 20 days supply | Qty: 60 | Fill #1

## 2020-07-26 NOTE — ED Triage Notes (Signed)
Pt c/o right flank pain. Has a CT scheduled Friday to R/O kidney stone. Pt states he has blood in his urine.

## 2020-07-26 NOTE — Discharge Instructions (Signed)

## 2020-07-26 NOTE — ED Provider Notes (Signed)
Emergency Department Provider Note   I have reviewed the triage vital signs and the nursing notes.   HISTORY  Chief Complaint Flank Pain   HPI Paul Sawyer is a 49 y.o. male with past medical history reviewed below presents to the emergency department with right flank pain.  He notes several days of pain and prior history of kidney stones requiring intervention.  He has noticed some dark discoloration to the urine which he believes is blood.  He has no difficulty with urination.  No dysuria, hesitancy, urgency.  No fevers or chills.  Right flank pain has been lingering and he has scheduled for a CT scan on Friday but with worsening symptoms presents to the emergency department.  Denies any chest pain or shortness of breath.  No other modifying factors.   Past Medical History:  Diagnosis Date  . PAD (peripheral artery disease) ([place])   . Varicose veins of both lower extremities     Patient Active Problem List   Diagnosis Date Noted  . Seymore Brodowski-term current use of opiate analgesic 12/27/2019  . Low back pain 12/27/2019  . Opioid dependence ([place]) 12/27/2019  . Thoracic outlet syndrome 03/19/2019  . PAD (peripheral artery disease) ([place]) 03/05/2019  . Varicose veins of both lower extremities with pain 08/22/2017  . Chronic venous insufficiency 08/22/2017  . DDD (degenerative disc disease), lumbosacral 07/07/2007  . Pain in joint involving lower leg 07/07/2007    Past Surgical History:  Procedure Laterality Date  . CHOLECYSTECTOMY  2004  . GASTRIC BYPASS  2005    Allergies Ciprofloxacin and Nsaids  Family History  Problem Relation Age of Onset  . Diabetes Father   . Heart disease Father   . Colon cancer Neg Hx   . Esophageal cancer Neg Hx   . Rectal cancer Neg Hx   . Stomach cancer Neg Hx     Social History Social History   Tobacco Use  . Smoking status: Never Smoker  . Smokeless tobacco: Never Used  Vaping Use  . Vaping Use: Never used  Substance Use Topics   . Alcohol use: Yes    Alcohol/week: 1.0 standard drink    Types: 1 Glasses of wine per week    Comment: weekly  . Drug use: No    Review of Systems  Constitutional: No fever/chills Eyes: No visual changes. ENT: No sore throat. Cardiovascular: Denies chest pain. Respiratory: Denies shortness of breath. Gastrointestinal: Positive right flank/abdominal pain.  No nausea, no vomiting.  No diarrhea.  No constipation. Genitourinary: Negative for dysuria. Positive hematuria.  Musculoskeletal: Negative for back pain. Skin: Negative for rash. Neurological: Negative for headaches, focal weakness or numbness.  10-point ROS otherwise negative.  ____________________________________________   PHYSICAL EXAM:  VITAL SIGNS: ED Triage Vitals  Enc Vitals Group     BP 07/26/20 1940 127/77     Pulse Rate 07/26/20 1940 (!) 55     Resp 07/26/20 1940 18     Temp 07/26/20 1940 97.7 F (36.5 C)     Temp Source 07/26/20 1940 Oral     SpO2 07/26/20 1940 98 %     Weight 07/26/20 1941 217 lb (98.4 kg)     Height 07/26/20 1941 6\' 4"  (1.93 m)   Constitutional: Alert and oriented. Well appearing and in no acute distress. Eyes: Conjunctivae are normal.  Head: Atraumatic. Nose: No congestion/rhinnorhea. Mouth/Throat: Mucous membranes are moist.   Neck: No stridor.   Cardiovascular: Normal rate, regular rhythm. Good peripheral circulation. Grossly normal  heart sounds.   Respiratory: Normal respiratory effort.  No retractions. Lungs CTAB. Gastrointestinal: Soft and nontender. No distention.  Musculoskeletal: No gross deformities of extremities. Neurologic:  Normal speech and language.  Skin:  Skin is warm, dry and intact. No rash noted.  ____________________________________________   LABS (all labs ordered are listed, but only abnormal results are displayed)  Labs Reviewed  COMPREHENSIVE METABOLIC PANEL - Abnormal; Notable for the following components:      Result Value   Total Bilirubin 1.9  (*)    All other components within normal limits  URINALYSIS, ROUTINE W REFLEX MICROSCOPIC - Abnormal; Notable for the following components:   Hgb urine dipstick MODERATE (*)    RBC / HPF >50 (*)    All other components within normal limits  URINE CULTURE   ____________________________________________  RADIOLOGY  CT Renal Stone Study  Result Date: 07/26/2020 CLINICAL DATA:  Right flank pain, nephrolithiasis, hematuria EXAM: CT ABDOMEN AND PELVIS WITHOUT CONTRAST TECHNIQUE: Multidetector CT imaging of the abdomen and pelvis was performed following the standard protocol without IV contrast. COMPARISON:  None. FINDINGS: Lower chest: Mild right basilar scarring. Mild right coronary artery calcification. Global cardiac size within normal limits. No pericardial effusion. Hepatobiliary: Cholecystectomy has been performed. Liver unremarkable. No intrahepatic biliary ductal dilation. Mild extrahepatic biliary ductal dilation with the bile duct measuring 10 mm in diameter may represent post cholecystectomy change, but is nonspecific. Pancreas: Unremarkable Spleen: Unremarkable Adrenals/Urinary Tract: The adrenal glands are unremarkable. The kidneys are normal in size and position. There are at least 4 nonobstructing calculi within the lower pole of the left kidney identified measuring up to 8 mm in greatest diameter. No hydronephrosis. No urolithiasis. No significant perinephric stranding or perinephric fluid collections identified. The bladder is unremarkable. Stomach/Bowel: Surgical changes of Roux-en-Y gastric bypass are identified. Moderate stool is seen throughout the colon. The stomach, small bowel, and large bowel are otherwise unremarkable there is no evidence of obstruction or focal inflammation. The appendix is normal. No free intraperitoneal gas or fluid. Vascular/Lymphatic: Moderate aortoiliac atherosclerotic calcification. No aortic aneurysm. No pathologic adenopathy within the abdomen and pelvis.  Reproductive: Prostate is unremarkable. Other: Rectum unremarkable.  No abdominal wall hernia identified. Musculoskeletal: Mild to moderate lumbar levoscoliosis, apex left at L2. Degenerative changes are noted within the lumbar spine. No acute bone abnormality identified. IMPRESSION: Nonobstructing left nephrolithiasis. No hydronephrosis. No urolithiasis. Status post cholecystectomy. Mild dilation of the extrahepatic bile duct is nonspecific, but may represent post cholecystectomy change. Correlation with liver enzymes may be helpful to exclude an obstructive process. No definite radiographic explanation for the patient's reported symptoms. No acute intra-abdominal pathology identified. Aortic Atherosclerosis (ICD10-I70.0). Electronically Signed   By: Fidela Salisbury MD   On: 07/26/2020 21:00    ____________________________________________   PROCEDURES  Procedure(s) performed:   Procedures  None  ____________________________________________   INITIAL IMPRESSION / ASSESSMENT AND PLAN / ED COURSE  Pertinent labs & imaging results that were available during my care of the patient were reviewed by me and considered in my medical decision making (see chart for details).   Patient presents emergency department valuation of right flank pain.  CT renal performed showing nonobstructing nephrolithiasis on the left with no hydro-.  Patient is status post cholecystectomy but liver enzymes and bilirubin are normal.  No focal anterior abdominal tenderness.  Patient does have some blood in the urine but no sign of infection.  Plan for symptom management at home with close PCP and urology follow-up with ongoing hematuria.  Discussed ED return precautions along with results from from testing today.    ____________________________________________  FINAL CLINICAL IMPRESSION(S) / ED DIAGNOSES  Final diagnoses:  Right flank pain  Gross hematuria    MEDICATIONS GIVEN DURING THIS VISIT:  Medications   ketorolac (TORADOL) 30 MG/ML injection 30 mg (30 mg Intramuscular Refused 07/26/20 2047)  oxyCODONE-acetaminophen (PERCOCET/ROXICET) 5-325 MG per tablet 1 tablet (1 tablet Oral Refused 07/26/20 2103)    Note:  This document was prepared using Dragon voice recognition software and may include unintentional dictation errors.  Nanda Quinton, MD, [facility] Emergency Medicine    Coren Sagan, [facility], MD 07/26/20 [phone]

## 2020-07-26 NOTE — ED Notes (Signed)
Patient discharged home to self.  All discharge instructions reviewed.  Patient verbalized understanding via teachback method.  Ambulatory out of ED.

## 2020-07-28 ENCOUNTER — Ambulatory Visit (HOSPITAL_COMMUNITY): Admission: RE | Admit: 2020-07-28 | Payer: 59 | Source: Ambulatory Visit

## 2020-07-28 ENCOUNTER — Encounter (HOSPITAL_COMMUNITY): Payer: Self-pay

## 2020-07-28 LAB — URINE CULTURE: Culture: <10000 — AB

## 2020-08-02 ENCOUNTER — Other Ambulatory Visit: Payer: Self-pay

## 2020-08-02 ENCOUNTER — Encounter: Payer: Self-pay | Admitting: Urology

## 2020-08-02 ENCOUNTER — Ambulatory Visit (INDEPENDENT_AMBULATORY_CARE_PROVIDER_SITE_OTHER): Payer: 59 | Admitting: Urology

## 2020-08-02 ENCOUNTER — Ambulatory Visit (HOSPITAL_COMMUNITY)
Admission: RE | Admit: 2020-08-02 | Discharge: 2020-08-02 | Disposition: A | Discharge status: Home / Self Care | Payer: 59 | Source: Ambulatory Visit | Attending: Urology | Admitting: Urology

## 2020-08-02 VITALS — BP 116/76 | HR 72 | Temp 98.7°F | Ht 76.0 in | Wt 217.0 lb

## 2020-08-02 DIAGNOSIS — M47816 Spondylosis without myelopathy or radiculopathy, lumbar region: Secondary | ICD-10-CM | POA: Diagnosis not present

## 2020-08-02 DIAGNOSIS — N200 Calculus of kidney: Secondary | ICD-10-CM | POA: Insufficient documentation

## 2020-08-02 DIAGNOSIS — N2889 Other specified disorders of kidney and ureter: Secondary | ICD-10-CM | POA: Diagnosis not present

## 2020-08-02 LAB — URINALYSIS, ROUTINE W REFLEX MICROSCOPIC
Bilirubin, UA: NEGATIVE
Glucose, UA: NEGATIVE
Leukocytes,UA: NEGATIVE
Nitrite, UA: NEGATIVE
Protein,UA: NEGATIVE
Specific Gravity, UA: 1.025 (ref 1.005–1.030)
Urobilinogen, Ur: 0.2 mg/dL (ref 0.2–1.0)
pH, UA: 5.5 (ref 5.0–7.5)

## 2020-08-02 LAB — MICROSCOPIC EXAMINATION
Bacteria, UA: NONE SEEN
Epithelial Cells (non renal): NONE SEEN /hpf (ref 0–10)
Renal Epithel, UA: NONE SEEN /hpf
WBC, UA: NONE SEEN /hpf (ref 0–5)

## 2020-08-02 NOTE — Patient Instructions (Signed)

## 2020-08-02 NOTE — Progress Notes (Signed)
08/02/2020 2:27 PM   [place] 11/18/1970 [id number]  Referring provider: Celene Squibb, MD [address] [place],  [place] [address]  Nephrolithiasis  HPI: [name] is a 49yo here for evaluation of nephrolithiasis. On 07/26/2020 he developed right flank pain and presented to the ER. He was diagnosed with a 1cm left lower pole. He has had numerous stone events starting 15 years ago after his bypass. He has required ESWL and ureteroscopy in the past.    PMH: Past Medical History:  Diagnosis Date  . Arthritis   . Clotting disorder ([place])    blood clot/dvt  . PAD (peripheral artery disease) ([place])   . Varicose veins of both lower extremities     Surgical History: Past Surgical History:  Procedure Laterality Date  . CHOLECYSTECTOMY  2004  . GASTRIC BYPASS  2005    Home Medications:  Allergies as of 08/02/2020      Reactions   Ciprofloxacin    Nsaids Nausea And Vomiting   Upset stomach       Medication List       Accurate as of August 02, 2020  2:27 PM. If you have any questions, ask your nurse or doctor.        STOP taking these medications   ALPRAZolam 0.5 MG tablet Commonly known as: Duanne Moron Stopped by: [place] Bang, MD   fluconazole 150 MG tablet Commonly known as: DIFLUCAN Stopped by: [place] Bang, MD   gabapentin 300 MG capsule Commonly known as: NEURONTIN Stopped by: [place] Bang, MD   hyoscyamine 0.125 MG SL tablet Commonly known as: LEVSIN SL Stopped by: [place] Bang, MD   lidocaine 5 % Commonly known as: LIDODERM Stopped by: [place] Bang, MD   triamcinolone 0.1 % Commonly known as: KENALOG Stopped by: [place] Bang, MD     TAKE these medications   Buprenorphine HCl-Naloxone HCl 8-2 MG Film What changed: Another medication with the same name was removed. Continue taking this medication, and follow the directions you see here. Changed by: [place] Bang, MD   cyclobenzaprine 10 MG tablet Commonly  known as: FLEXERIL   Eliquis 5 MG Tabs tablet Generic drug: apixaban Take 5 mg by mouth 2 (two) times daily.       Allergies:  Allergies  Allergen Reactions  . Ciprofloxacin   . Nsaids Nausea And Vomiting    Upset stomach     Family History: Family History  Problem Relation Age of Onset  . Diabetes Father   . Heart disease Father   . Colon cancer Neg Hx   . Esophageal cancer Neg Hx   . Rectal cancer Neg Hx   . Stomach cancer Neg Hx     Social History:  reports that he has never smoked. He has never used smokeless tobacco. He reports current alcohol use of about 1.0 standard drink of alcohol per week. He reports that he does not use drugs.  ROS: All other review of systems were reviewed and are negative except what is noted above in HPI  Physical Exam: BP 116/76   Pulse 72   Temp 98.7 F (37.1 C)   Ht 6\' 4"  (1.93 m)   Wt 217 lb (98.4 kg)   BMI 26.41 kg/m   Constitutional:  Alert and oriented, No acute distress. HEENT: [place], moist mucus membranes.  Trachea midline, no masses. Cardiovascular: No clubbing, cyanosis, or edema. Respiratory: Normal respiratory effort, no increased work of breathing. GI: Abdomen is soft, nontender, nondistended, no  abdominal masses GU: No CVA tenderness.  Lymph: No cervical or inguinal lymphadenopathy. Skin: No rashes, bruises or suspicious lesions. Neurologic: Grossly intact, no focal deficits, moving all 4 extremities. Psychiatric: Normal mood and affect.  Laboratory Data: Lab Results  Component Value Date   WBC 5.4 09/08/2019   HGB 14.4 09/08/2019   HCT 42.8 09/08/2019   MCV 91.8 09/08/2019   PLT 172.0 09/08/2019    Lab Results  Component Value Date   CREATININE 0.85 07/26/2020    No results found for: PSA  No results found for: TESTOSTERONE  No results found for: HGBA1C  Urinalysis    Component Value Date/Time   COLORURINE YELLOW 07/26/2020 2028   APPEARANCEUR CLEAR 07/26/2020 2028   LABSPEC 1.014 07/26/2020  2028   PHURINE 7.0 07/26/2020 2028   GLUCOSEU NEGATIVE 07/26/2020 2028   HGBUR MODERATE (A) 07/26/2020 2028   BILIRUBINUR NEGATIVE 07/26/2020 2028   KETONESUR NEGATIVE 07/26/2020 2028   PROTEINUR NEGATIVE 07/26/2020 2028   NITRITE NEGATIVE 07/26/2020 2028   LEUKOCYTESUR NEGATIVE 07/26/2020 2028    Lab Results  Component Value Date   BACTERIA NONE SEEN 07/26/2020    Pertinent Imaging: CT stone 07/26/2020: Images reviewed and discussed with the patient  No results found for this or any previous visit.  No results found for this or any previous visit.  No results found for this or any previous visit.  No results found for this or any previous visit.  No results found for this or any previous visit.  No results found for this or any previous visit.  No results found for this or any previous visit.  Results for orders placed during the hospital encounter of 07/26/20  CT Renal Stone Study  Narrative CLINICAL DATA:  Right flank pain, nephrolithiasis, hematuria  EXAM: CT ABDOMEN AND PELVIS WITHOUT CONTRAST  TECHNIQUE: Multidetector CT imaging of the abdomen and pelvis was performed following the standard protocol without IV contrast.  COMPARISON:  None.  FINDINGS: Lower chest: Mild right basilar scarring. Mild right coronary artery calcification. Global cardiac size within normal limits. No pericardial effusion.  Hepatobiliary: Cholecystectomy has been performed. Liver unremarkable. No intrahepatic biliary ductal dilation. Mild extrahepatic biliary ductal dilation with the bile duct measuring 10 mm in diameter may represent post cholecystectomy change, but is nonspecific.  Pancreas: Unremarkable  Spleen: Unremarkable  Adrenals/Urinary Tract: The adrenal glands are unremarkable. The kidneys are normal in size and position. There are [place]yer least 4 nonobstructing calculi within the lower pole of the left kidney identified measuring up to 8 mm in greatest diameter.  No hydronephrosis. No urolithiasis. No significant perinephric stranding or perinephric fluid collections identified. The bladder is unremarkable.  Stomach/Bowel: Surgical changes of Roux-en-Y gastric bypass are identified. Moderate stool is seen throughout the colon. The stomach, small bowel, and large bowel are otherwise unremarkable there is no evidence of obstruction or focal inflammation. The appendix is normal. No free intraperitoneal gas or fluid.  Vascular/Lymphatic: Moderate aortoiliac atherosclerotic calcification. No aortic aneurysm. No pathologic adenopathy within the abdomen and pelvis.  Reproductive: Prostate is unremarkable.  Other: Rectum unremarkable.  No abdominal wall hernia identified.  Musculoskeletal: Mild to moderate lumbar levoscoliosis, apex left [place] L2. Degenerative changes are noted within the lumbar spine. No acute bone abnormality identified.  IMPRESSION: Nonobstructing left nephrolithiasis. No hydronephrosis. No urolithiasis.  Status post cholecystectomy. Mild dilation of the extrahepatic bile duct is nonspecific, but may represent post cholecystectomy change. Correlation with liver enzymes may be helpful to exclude an obstructive process.    No definite radiographic explanation for the patient's reported symptoms. No acute intra-abdominal pathology identified.  Aortic Atherosclerosis (ICD10-I70.0).   Electronically Signed By: Fidela Salisbury MD On: 07/26/2020 21:00   Assessment & Plan:    1. Kidney stones -We discussed the management of kidney stones. These options include observation, ureteroscopy, shockwave lithotripsy (ESWL) and percutaneous nephrolithotomy (PCNL). We discussed which options are relevant to the patient's stone(s). We discussed the natural history of kidney stones as well as the complications of untreated stones and the impact on quality of life without treatment as well as with each of the above listed treatments. We also  discussed the efficacy of each treatment in its ability to clear the stone burden. With any of these management options I discussed the signs and symptoms of infection and the need for emergent treatment should these be experienced. For each option we discussed the ability of each procedure to clear the patient of their stone burden.   For observation I described the risks which include but are not limited to silent renal damage, life-threatening infection, need for emergent surgery, failure to pass stone and pain.   For ureteroscopy I described the risks which include bleeding, infection, damage to contiguous structures, positioning injury, ureteral stricture, ureteral avulsion, ureteral injury, need for prolonged ureteral stent, inability to perform ureteroscopy, need for an interval procedure, inability to clear stone burden, stent discomfort/pain, heart attack, stroke, pulmonary embolus and the inherent risks with general anesthesia.   For shockwave lithotripsy I described the risks which include arrhythmia, kidney contusion, kidney hemorrhage, need for transfusion, pain, inability to adequately break up stone, inability to pass stone fragments, Steinstrasse, infection associated with obstructing stones, need for alternate surgical procedure, need for repeat shockwave lithotripsy, MI, CVA, PE and the inherent risks with anesthesia/conscious sedation.   For PCNL I described the risks including positioning injury, pneumothorax, hydrothorax, need for chest tube, inability to clear stone burden, renal laceration, arterial venous fistula or malformation, need for embolization of kidney, loss of kidney or renal function, need for repeat procedure, need for prolonged nephrostomy tube, ureteral avulsion, MI, CVA, PE and the inherent risks of general anesthesia.   - The patient would like to proceed with left ESWL - Urinalysis, Routine w reflex microscopic - BLADDER SCAN AMB NON-IMAGING   No follow-ups on  file.  [place] Bang, MD  [facility] [place]

## 2020-08-02 NOTE — H&P (View-Only) (Signed)
08/02/2020 2:27 PM   [place] 07/06/1971 [id number]  Referring provider: Celene Squibb, MD [address] [place],  [place] [address]  Nephrolithiasis  HPI: [name] is a 49yo here for evaluation of nephrolithiasis. On 07/26/2020 he developed right flank pain and presented to the ER. He was diagnosed with a 1cm left lower pole. He has had numerous stone events starting 15 years ago after his bypass. He has required ESWL and ureteroscopy in the past.    PMH: Past Medical History:  Diagnosis Date  . Arthritis   . Clotting disorder ([place])    blood clot/dvt  . PAD (peripheral artery disease) ([place])   . Varicose veins of both lower extremities     Surgical History: Past Surgical History:  Procedure Laterality Date  . CHOLECYSTECTOMY  2004  . GASTRIC BYPASS  2005    Home Medications:  Allergies as of 08/02/2020      Reactions   Ciprofloxacin    Nsaids Nausea And Vomiting   Upset stomach       Medication List       Accurate as of August 02, 2020  2:27 PM. If you have any questions, ask your nurse or doctor.        STOP taking these medications   ALPRAZolam 0.5 MG tablet Commonly known as: Duanne Moron Stopped by: Nicolette Bang, MD   fluconazole 150 MG tablet Commonly known as: DIFLUCAN Stopped by: Nicolette Bang, MD   gabapentin 300 MG capsule Commonly known as: NEURONTIN Stopped by: Nicolette Bang, MD   hyoscyamine 0.125 MG SL tablet Commonly known as: LEVSIN SL Stopped by: Nicolette Bang, MD   lidocaine 5 % Commonly known as: LIDODERM Stopped by: Nicolette Bang, MD   triamcinolone 0.1 % Commonly known as: KENALOG Stopped by: Nicolette Bang, MD     TAKE these medications   Buprenorphine HCl-Naloxone HCl 8-2 MG Film What changed: Another medication with the same name was removed. Continue taking this medication, and follow the directions you see here. Changed by: Nicolette Bang, MD   cyclobenzaprine 10 MG tablet Commonly  known as: FLEXERIL   Eliquis 5 MG Tabs tablet Generic drug: apixaban Take 5 mg by mouth 2 (two) times daily.       Allergies:  Allergies  Allergen Reactions  . Ciprofloxacin   . Nsaids Nausea And Vomiting    Upset stomach     Family History: Family History  Problem Relation Age of Onset  . Diabetes Father   . Heart disease Father   . Colon cancer Neg Hx   . Esophageal cancer Neg Hx   . Rectal cancer Neg Hx   . Stomach cancer Neg Hx     Social History:  reports that he has never smoked. He has never used smokeless tobacco. He reports current alcohol use of about 1.0 standard drink of alcohol per week. He reports that he does not use drugs.  ROS: All other review of systems were reviewed and are negative except what is noted above in HPI  Physical Exam: BP 116/76   Pulse 72   Temp 98.7 F (37.1 C)   Ht 6\' 4"  (1.93 m)   Wt 217 lb (98.4 kg)   BMI 26.41 kg/m   Constitutional:  Alert and oriented, No acute distress. HEENT: [place] AT, moist mucus membranes.  Trachea midline, no masses. Cardiovascular: No clubbing, cyanosis, or edema. Respiratory: Normal respiratory effort, no increased work of breathing. GI: Abdomen is soft, nontender, nondistended, no  abdominal masses GU: No CVA tenderness.  Lymph: No cervical or inguinal lymphadenopathy. Skin: No rashes, bruises or suspicious lesions. Neurologic: Grossly intact, no focal deficits, moving all 4 extremities. Psychiatric: Normal mood and affect.  Laboratory Data: Lab Results  Component Value Date   WBC 5.4 09/08/2019   HGB 14.4 09/08/2019   HCT 42.8 09/08/2019   MCV 91.8 09/08/2019   PLT 172.0 09/08/2019    Lab Results  Component Value Date   CREATININE 0.85 07/26/2020    No results found for: PSA  No results found for: TESTOSTERONE  No results found for: HGBA1C  Urinalysis    Component Value Date/Time   COLORURINE YELLOW 07/26/2020 2028   APPEARANCEUR CLEAR 07/26/2020 2028   LABSPEC 1.014 07/26/2020  2028   PHURINE 7.0 07/26/2020 2028   GLUCOSEU NEGATIVE 07/26/2020 2028   HGBUR MODERATE (A) 07/26/2020 2028   BILIRUBINUR NEGATIVE 07/26/2020 2028   KETONESUR NEGATIVE 07/26/2020 2028   PROTEINUR NEGATIVE 07/26/2020 2028   NITRITE NEGATIVE 07/26/2020 2028   LEUKOCYTESUR NEGATIVE 07/26/2020 2028    Lab Results  Component Value Date   BACTERIA NONE SEEN 07/26/2020    Pertinent Imaging: CT stone 07/26/2020: Images reviewed and discussed with the patient  No results found for this or any previous visit.  No results found for this or any previous visit.  No results found for this or any previous visit.  No results found for this or any previous visit.  No results found for this or any previous visit.  No results found for this or any previous visit.  No results found for this or any previous visit.  Results for orders placed during the hospital encounter of 07/26/20  CT Renal Stone Study  Narrative CLINICAL DATA:  Right flank pain, nephrolithiasis, hematuria  EXAM: CT ABDOMEN AND PELVIS WITHOUT CONTRAST  TECHNIQUE: Multidetector CT imaging of the abdomen and pelvis was performed following the standard protocol without IV contrast.  COMPARISON:  None.  FINDINGS: Lower chest: Mild right basilar scarring. Mild right coronary artery calcification. Global cardiac size within normal limits. No pericardial effusion.  Hepatobiliary: Cholecystectomy has been performed. Liver unremarkable. No intrahepatic biliary ductal dilation. Mild extrahepatic biliary ductal dilation with the bile duct measuring 10 mm in diameter may represent post cholecystectomy change, but is nonspecific.  Pancreas: Unremarkable  Spleen: Unremarkable  Adrenals/Urinary Tract: The adrenal glands are unremarkable. The kidneys are normal in size and position. There are at least 4 nonobstructing calculi within the lower pole of the left kidney identified measuring up to 8 mm in greatest diameter.  No hydronephrosis. No urolithiasis. No significant perinephric stranding or perinephric fluid collections identified. The bladder is unremarkable.  Stomach/Bowel: Surgical changes of Roux-en-Y gastric bypass are identified. Moderate stool is seen throughout the colon. The stomach, small bowel, and large bowel are otherwise unremarkable there is no evidence of obstruction or focal inflammation. The appendix is normal. No free intraperitoneal gas or fluid.  Vascular/Lymphatic: Moderate aortoiliac atherosclerotic calcification. No aortic aneurysm. No pathologic adenopathy within the abdomen and pelvis.  Reproductive: Prostate is unremarkable.  Other: Rectum unremarkable.  No abdominal wall hernia identified.  Musculoskeletal: Mild to moderate lumbar levoscoliosis, apex left at L2. Degenerative changes are noted within the lumbar spine. No acute bone abnormality identified.  IMPRESSION: Nonobstructing left nephrolithiasis. No hydronephrosis. No urolithiasis.  Status post cholecystectomy. Mild dilation of the extrahepatic bile duct is nonspecific, but may represent post cholecystectomy change. Correlation with liver enzymes may be helpful to exclude an obstructive process.  No definite radiographic explanation for the patient's reported symptoms. No acute intra-abdominal pathology identified.  Aortic Atherosclerosis (ICD10-I70.0).   Electronically Signed By: Fidela Salisbury MD On: 07/26/2020 21:00   Assessment & Plan:    1. Kidney stones -We discussed the management of kidney stones. These options include observation, ureteroscopy, shockwave lithotripsy (ESWL) and percutaneous nephrolithotomy (PCNL). We discussed which options are relevant to the patient's stone(s). We discussed the natural history of kidney stones as well as the complications of untreated stones and the impact on quality of life without treatment as well as with each of the above listed treatments. We also  discussed the efficacy of each treatment in its ability to clear the stone burden. With any of these management options I discussed the signs and symptoms of infection and the need for emergent treatment should these be experienced. For each option we discussed the ability of each procedure to clear the patient of their stone burden.   For observation I described the risks which include but are not limited to silent renal damage, life-threatening infection, need for emergent surgery, failure to pass stone and pain.   For ureteroscopy I described the risks which include bleeding, infection, damage to contiguous structures, positioning injury, ureteral stricture, ureteral avulsion, ureteral injury, need for prolonged ureteral stent, inability to perform ureteroscopy, need for an interval procedure, inability to clear stone burden, stent discomfort/pain, heart attack, stroke, pulmonary embolus and the inherent risks with general anesthesia.   For shockwave lithotripsy I described the risks which include arrhythmia, kidney contusion, kidney hemorrhage, need for transfusion, pain, inability to adequately break up stone, inability to pass stone fragments, Steinstrasse, infection associated with obstructing stones, need for alternate surgical procedure, need for repeat shockwave lithotripsy, MI, CVA, PE and the inherent risks with anesthesia/conscious sedation.   For PCNL I described the risks including positioning injury, pneumothorax, hydrothorax, need for chest tube, inability to clear stone burden, renal laceration, arterial venous fistula or malformation, need for embolization of kidney, loss of kidney or renal function, need for repeat procedure, need for prolonged nephrostomy tube, ureteral avulsion, MI, CVA, PE and the inherent risks of general anesthesia.   - The patient would like to proceed with left ESWL - Urinalysis, Routine w reflex microscopic - BLADDER SCAN AMB NON-IMAGING   No follow-ups on  file.  Nicolette Bang, MD  [facility] [place]

## 2020-08-02 NOTE — Progress Notes (Signed)
Bladder Scan Patient can void: 105 ml   Pt voided then began to drink water Performed By: Janijah Symons,lpn Urological Symptom Review  Patient is experiencing the following symptoms: Frequent urination Get up at night to urinate Trouble starting stream Blood in urine Kidney stones  Review of Systems  Gastrointestinal (upper)  : Negative for upper GI symptoms  Gastrointestinal (lower) : Negative for lower GI symptoms  Constitutional : Negative for symptoms  Skin: Negative for skin symptoms  Eyes: Negative for eye symptoms  Ear/Nose/Throat : Negative for Ear/Nose/Throat symptoms  Hematologic/Lymphatic: Negative for Hematologic/Lymphatic symptoms  Cardiovascular : Negative for cardiovascular symptoms  Respiratory : Negative for respiratory symptoms  Endocrine: Negative for endocrine symptoms  Musculoskeletal: Back pain Joint pain  Neurological: Negative for neurological symptoms  Psychologic: Negative for psychiatric symptoms ar

## 2020-08-03 ENCOUNTER — Encounter (HOSPITAL_COMMUNITY): Payer: Self-pay

## 2020-08-03 ENCOUNTER — Other Ambulatory Visit (HOSPITAL_COMMUNITY)
Admission: RE | Admit: 2020-08-03 | Discharge: 2020-08-03 | Disposition: A | Discharge status: Home / Self Care | Payer: 59 | Source: Ambulatory Visit | Attending: Urology | Admitting: Urology

## 2020-08-03 ENCOUNTER — Other Ambulatory Visit: Payer: Self-pay

## 2020-08-03 ENCOUNTER — Encounter (HOSPITAL_COMMUNITY)
Admission: RE | Admit: 2020-08-03 | Discharge: 2020-08-03 | Disposition: A | Discharge status: Home / Self Care | Payer: 59 | Source: Ambulatory Visit | Attending: Urology | Admitting: Urology

## 2020-08-03 DIAGNOSIS — Z20822 Contact with and (suspected) exposure to covid-19: Secondary | ICD-10-CM | POA: Diagnosis not present

## 2020-08-03 DIAGNOSIS — Z01812 Encounter for preprocedural laboratory examination: Secondary | ICD-10-CM | POA: Diagnosis not present

## 2020-08-03 DIAGNOSIS — I824Z2 Acute embolism and thrombosis of unspecified deep veins of left distal lower extremity: Secondary | ICD-10-CM | POA: Diagnosis not present

## 2020-08-03 HISTORY — DX: Personal history of urinary calculi: Z87.442

## 2020-08-04 LAB — SARS CORONAVIRUS 2 (TAT 6-24 HRS): SARS Coronavirus 2: NEGATIVE

## 2020-08-08 ENCOUNTER — Encounter (HOSPITAL_COMMUNITY)
Admission: RE | Disposition: A | Discharge status: Home / Self Care | Payer: Self-pay | Source: Ambulatory Visit | Attending: Urology

## 2020-08-08 ENCOUNTER — Ambulatory Visit (HOSPITAL_COMMUNITY)
Admission: RE | Admit: 2020-08-08 | Discharge: 2020-08-08 | Disposition: A | Discharge status: Home / Self Care | Payer: 59 | Source: Ambulatory Visit | Attending: Urology | Admitting: Urology

## 2020-08-08 ENCOUNTER — Encounter (HOSPITAL_COMMUNITY): Payer: Self-pay | Admitting: Urology

## 2020-08-08 ENCOUNTER — Other Ambulatory Visit: Payer: Self-pay

## 2020-08-08 ENCOUNTER — Ambulatory Visit (INDEPENDENT_AMBULATORY_CARE_PROVIDER_SITE_OTHER): Payer: 59 | Admitting: Podiatry

## 2020-08-08 ENCOUNTER — Ambulatory Visit (HOSPITAL_COMMUNITY): Payer: 59

## 2020-08-08 DIAGNOSIS — Z7901 Long term (current) use of anticoagulants: Secondary | ICD-10-CM | POA: Insufficient documentation

## 2020-08-08 DIAGNOSIS — Z86718 Personal history of other venous thrombosis and embolism: Secondary | ICD-10-CM | POA: Insufficient documentation

## 2020-08-08 DIAGNOSIS — N200 Calculus of kidney: Secondary | ICD-10-CM | POA: Diagnosis not present

## 2020-08-08 DIAGNOSIS — Z9884 Bariatric surgery status: Secondary | ICD-10-CM | POA: Insufficient documentation

## 2020-08-08 DIAGNOSIS — Z01818 Encounter for other preprocedural examination: Secondary | ICD-10-CM | POA: Diagnosis not present

## 2020-08-08 DIAGNOSIS — Z87442 Personal history of urinary calculi: Secondary | ICD-10-CM | POA: Diagnosis not present

## 2020-08-08 DIAGNOSIS — Z79899 Other long term (current) drug therapy: Secondary | ICD-10-CM | POA: Diagnosis not present

## 2020-08-08 DIAGNOSIS — Z5329 Procedure and treatment not carried out because of patient's decision for other reasons: Secondary | ICD-10-CM

## 2020-08-08 HISTORY — PX: EXTRACORPOREAL SHOCK WAVE LITHOTRIPSY: SHX1557

## 2020-08-08 SURGERY — LITHOTRIPSY, ESWL
Anesthesia: LOCAL | Laterality: Left

## 2020-08-08 MED ORDER — TRAMADOL HCL 50 MG PO TABS: 50.0000 mg | ORAL_TABLET | Freq: Four times a day (QID) | ORAL | 0 refills | Status: DC | PRN

## 2020-08-08 MED ORDER — DIPHENHYDRAMINE HCL 25 MG PO CAPS
25.0000 mg | ORAL_CAPSULE | ORAL | Status: AC
  Administered 2020-08-08: 25 mg via ORAL
  Filled 2020-08-08: qty 1

## 2020-08-08 MED ORDER — SODIUM CHLORIDE 0.9 % IV SOLN: Freq: Once | INTRAVENOUS | Status: AC

## 2020-08-08 MED ORDER — DIAZEPAM 5 MG PO TABS
10.0000 mg | ORAL_TABLET | Freq: Once | ORAL | Status: AC
  Administered 2020-08-08: 10 mg via ORAL
  Filled 2020-08-08: qty 2

## 2020-08-08 NOTE — Discharge Instructions (Addendum)
Lithotripsy, Care After This sheet gives you information about how to care for yourself after your procedure. Your health care provider may also give you more specific instructions. If you have problems or questions, contact your health care provider. What can I expect after the procedure? After the procedure, it is common to have:  Some blood in your urine. This should only last for a few days.  Soreness in your back, sides, or upper abdomen for a few days.  Blotches or bruises on your back where the pressure wave entered the skin.  Pain, discomfort, or nausea when pieces (fragments) of the kidney stone move through the tube that carries urine from the kidney to the bladder (ureter). Stone fragments may pass soon after the procedure, but they may continue to pass for up to 4-8 weeks. ? If you have severe pain or nausea, contact your health care provider. This may be caused by a large stone that was not broken up, and this may mean that you need more treatment.  Some pain or discomfort during urination.  Some pain or discomfort in the lower abdomen or (in men) at the base of the penis. Follow these instructions at home: Medicines  Take over-the-counter and prescription medicines only as told by your health care provider.  If you were prescribed an antibiotic medicine, take it as told by your health care provider. Do not stop taking the antibiotic even if you start to feel better.  Do not drive for 24 hours if you were given a medicine to help you relax (sedative).  Do not drive or use heavy machinery while taking prescription pain medicine. Eating and drinking      Drink enough water and fluids to keep your urine clear or pale yellow. This helps any remaining pieces of the stone to pass. It can also help prevent new stones from forming.  Eat plenty of fresh fruits and vegetables.  Follow instructions from your health care provider about eating and drinking restrictions. You may be  instructed: ? To reduce how much salt (sodium) you eat or drink. Check ingredients and nutrition facts on packaged foods and beverages. ? To reduce how much meat you eat.  Eat the recommended amount of calcium for your age and gender. Ask your health care provider how much calcium you should have. General instructions  Get plenty of rest.  Most people can resume normal activities 1-2 days after the procedure. Ask your health care provider what activities are safe for you.  Your health care provider may direct you to lie in a certain position (postural drainage) and tap firmly (percuss) over your kidney area to help stone fragments pass. Follow instructions as told by your health care provider.  If directed, strain all urine through the strainer that was provided by your health care provider. ? Keep all fragments for your health care provider to see. Any stones that are found may be sent to a medical lab for examination. The stone may be as small as a grain of salt.  Keep all follow-up visits as told by your health care provider. This is important. Contact a health care provider if:  You have pain that is severe or does not get better with medicine.  You have nausea that is severe or does not go away.  You have blood in your urine longer than your health care provider told you to expect.  You have more blood in your urine.  You have pain during urination that does   not go away.  You urinate more frequently than usual and this does not go away.  You develop a rash or any other possible signs of an allergic reaction. Get help right away if:  You have severe pain in your back, sides, or upper abdomen.  You have severe pain while urinating.  Your urine is very dark red.  You have blood in your stool (feces).  You cannot pass any urine at all.  You feel a strong urge to urinate after emptying your bladder.  You have a fever or chills.  You develop shortness of breath,  difficulty breathing, or chest pain.  You have severe nausea that leads to persistent vomiting.  You faint. Summary  After this procedure, it is common to have some pain, discomfort, or nausea when pieces (fragments) of the kidney stone move through the tube that carries urine from the kidney to the bladder (ureter). If this pain or nausea is severe, however, you should contact your health care provider.  Most people can resume normal activities 1-2 days after the procedure. Ask your health care provider what activities are safe for you.  Drink enough water and fluids to keep your urine clear or pale yellow. This helps any remaining pieces of the stone to pass, and it can help prevent new stones from forming.  If directed, strain your urine and keep all fragments for your health care provider to see. Fragments or stones may be as small as a grain of salt.  Get help right away if you have severe pain in your back, sides, or upper abdomen or have severe pain while urinating. This information is not intended to replace advice given to you by your health care provider. Make sure you discuss any questions you have with your health care provider. Document Revised: 11/09/2018 Document Reviewed: 06/19/2016 Elsevier Patient Education  2020 Elsevier Inc.  

## 2020-08-08 NOTE — Interval H&P Note (Signed)
History and Physical Interval Note:  08/08/2020 8:50 AM  Paul Sawyer  has presented today for surgery, with the diagnosis of left renal calculus.  The various methods of treatment have been discussed with the patient and family. After consideration of risks, benefits and other options for treatment, the patient has consented to  Procedure(s): EXTRACORPOREAL SHOCK WAVE LITHOTRIPSY (ESWL) (Left) as a surgical intervention.  The patient's history has been reviewed, patient examined, no change in status, stable for surgery.  I have reviewed the patient's chart and labs.  Questions were answered to the patient's satisfaction.     Wilkie Aye

## 2020-08-08 NOTE — Progress Notes (Signed)
No show for appt. 

## 2020-08-08 NOTE — Progress Notes (Signed)
Pt has a slight pinkish bruise to the left upper flank area. Skin dry and intact.

## 2020-08-09 ENCOUNTER — Encounter (HOSPITAL_COMMUNITY): Payer: Self-pay | Admitting: Urology

## 2020-08-23 ENCOUNTER — Other Ambulatory Visit: Payer: Self-pay | Admitting: Podiatry

## 2020-08-23 MED FILL — CYCLOBENZAPRINE HCL 10 MG T: 10 | 20 days supply | Qty: 60 | Fill #2

## 2020-08-23 MED FILL — BUPREN-NALOX SL TAB 8-2MG: 8-2 | 30 days supply | Qty: 30 | Fill #0

## 2020-08-25 ENCOUNTER — Other Ambulatory Visit: Payer: Self-pay

## 2020-08-25 ENCOUNTER — Ambulatory Visit (INDEPENDENT_AMBULATORY_CARE_PROVIDER_SITE_OTHER): Payer: 59 | Admitting: Urology

## 2020-08-25 ENCOUNTER — Ambulatory Visit (HOSPITAL_COMMUNITY)
Admission: RE | Admit: 2020-08-25 | Discharge: 2020-08-25 | Disposition: A | Discharge status: Home / Self Care | Payer: 59 | Source: Ambulatory Visit | Attending: Urology | Admitting: Urology

## 2020-08-25 ENCOUNTER — Ambulatory Visit: Payer: 59 | Admitting: Urology

## 2020-08-25 ENCOUNTER — Encounter: Payer: Self-pay | Admitting: Urology

## 2020-08-25 VITALS — BP 120/80 | HR 60 | Temp 97.2°F | Ht 76.0 in | Wt 217.0 lb

## 2020-08-25 DIAGNOSIS — N200 Calculus of kidney: Secondary | ICD-10-CM | POA: Diagnosis not present

## 2020-08-25 LAB — URINALYSIS, ROUTINE W REFLEX MICROSCOPIC
Bilirubin, UA: NEGATIVE
Glucose, UA: NEGATIVE
Ketones, UA: NEGATIVE
Leukocytes,UA: NEGATIVE
Nitrite, UA: NEGATIVE
Protein,UA: NEGATIVE
Specific Gravity, UA: 1.02 (ref 1.005–1.030)
Urobilinogen, Ur: 1 mg/dL (ref 0.2–1.0)
pH, UA: 5.5 (ref 5.0–7.5)

## 2020-08-25 LAB — MICROSCOPIC EXAMINATION
Bacteria, UA: NONE SEEN
Epithelial Cells (non renal): NONE SEEN /hpf (ref 0–10)
Renal Epithel, UA: NONE SEEN /hpf
WBC, UA: NONE SEEN /hpf (ref 0–5)

## 2020-08-25 MED FILL — FLUCONAZOLE 150 MG TABS: 150 | 28 days supply | Qty: 4 | Fill #0

## 2020-08-25 NOTE — Progress Notes (Signed)
Urological Symptom Review  Patient is experiencing the following symptoms: Frequency   Review of Systems  Gastrointestinal (upper)  : Negative for upper GI symptoms  Gastrointestinal (lower) : Negative for lower GI symptoms  Constitutional : Negative for symptoms  Skin: Negative for skin symptoms  Eyes: Negative for eye symptoms  Ear/Nose/Throat : Negative for Ear/Nose/Throat symptoms  Hematologic/Lymphatic: Negative for Hematologic/Lymphatic symptoms  Cardiovascular : Negative for cardiovascular symptoms  Respiratory : Negative for respiratory symptoms  Endocrine: Negative for endocrine symptoms  Musculoskeletal: Negative for musculoskeletal symptoms  Neurological: Negative for neurological symptoms  Psychologic: Negative for psychiatric symptoms

## 2020-08-25 NOTE — Telephone Encounter (Signed)
Please advise 

## 2020-08-25 NOTE — Patient Instructions (Signed)

## 2020-09-05 NOTE — Progress Notes (Signed)
08/25/2020 9:26 AM   Paul Sawyer Jul 18, 1971 [id number]  Referring provider: Celene Squibb, MD [address] [name],  [place] [address]  followup ESWL  HPI: Paul Sawyer is a 50yo here for for followup for nephrolithiasis. He underwent ESWL 2 weeks ago. He has passed multiple fragments. No flank pain. No significant LUTS.    PMH: Past Medical History:  Diagnosis Date  . Arthritis   . Clotting disorder ([place])    blood clot/dvt  . History of kidney stones   . PAD (peripheral artery disease) ([place])   . Varicose veins of both lower extremities     Surgical History: Past Surgical History:  Procedure Laterality Date  . CHOLECYSTECTOMY  2004  . EXTRACORPOREAL SHOCK WAVE LITHOTRIPSY Left 08/08/2020   Procedure: EXTRACORPOREAL SHOCK WAVE LITHOTRIPSY (ESWL);  Surgeon: Cleon Gustin, MD;  Location: [facility];  Service: Urology;  Laterality: Left;  Marland Kitchen GASTRIC BYPASS  2005    Home Medications:  Allergies as of 08/25/2020      Reactions   Ciprofloxacin    Altered mental state   Nsaids Nausea And Vomiting   Upset stomach       Medication List       Accurate as of August 25, 2020 11:59 PM. If you have any questions, ask your nurse or doctor.        buprenorphine-naloxone 8-2 mg Subl SL tablet Commonly known as: SUBOXONE Place 1 tablet under the tongue daily.   Eliquis 5 MG Tabs tablet Generic drug: apixaban Take 5 mg by mouth 2 (two) times daily.   fluconazole 150 MG tablet Commonly known as: DIFLUCAN TAKE 1 TABLET BY MOUTH ONCE A WEEK   multivitamin with minerals Tabs tablet Take 1 tablet by mouth daily.   traMADol 50 MG tablet Commonly known as: Ultram Take 1 tablet (50 mg total) by mouth every 6 (six) hours as needed.       Allergies:  Allergies  Allergen Reactions  . Ciprofloxacin     Altered mental state  . Nsaids Nausea And Vomiting    Upset stomach     Family History: Family History  Problem Relation Age of Onset  . Diabetes Father    . Heart disease Father   . Colon cancer Neg Hx   . Esophageal cancer Neg Hx   . Rectal cancer Neg Hx   . Stomach cancer Neg Hx     Social History:  reports that he has never smoked. He has never used smokeless tobacco. He reports current alcohol use of about 1.0 standard drink of alcohol per week. He reports that he does not use drugs.  ROS: All other review of systems were reviewed and are negative except what is noted above in HPI  Physical Exam: BP 120/80   Pulse 60   Temp (!) 97.2 F (36.2 C)   Ht 6\' 4"  (1.93 m)   Wt 217 lb (98.4 kg)   BMI 26.41 kg/m   Constitutional:  Alert and oriented, No acute distress. HEENT: [place] AT, moist mucus membranes.  Trachea midline, no masses. Cardiovascular: No clubbing, cyanosis, or edema. Respiratory: Normal respiratory effort, no increased work of breathing. GI: Abdomen is soft, nontender, nondistended, no abdominal masses GU: No CVA tenderness.  Lymph: No cervical or inguinal lymphadenopathy. Skin: No rashes, bruises or suspicious lesions. Neurologic: Grossly intact, no focal deficits, moving all 4 extremities. Psychiatric: Normal mood and affect.  Laboratory Data: Lab Results  Component Value Date   WBC  5.4 09/08/2019   HGB 14.4 09/08/2019   HCT 42.8 09/08/2019   MCV 91.8 09/08/2019   PLT 172.0 09/08/2019    Lab Results  Component Value Date   CREATININE 0.85 07/26/2020    No results found for: PSA  No results found for: TESTOSTERONE  No results found for: HGBA1C  Urinalysis    Component Value Date/Time   COLORURINE YELLOW 07/26/2020 2028   APPEARANCEUR Clear 08/25/2020 1338   LABSPEC 1.014 07/26/2020 2028   PHURINE 7.0 07/26/2020 2028   GLUCOSEU Negative 08/25/2020 1338   HGBUR MODERATE (A) 07/26/2020 2028   BILIRUBINUR Negative 08/25/2020 [place] 07/26/2020 2028   PROTEINUR Negative 08/25/2020 [place] NEGATIVE 07/26/2020 2028   NITRITE Negative 08/25/2020 1338   NITRITE NEGATIVE  07/26/2020 2028   LEUKOCYTESUR Negative 08/25/2020 1338   LEUKOCYTESUR NEGATIVE 07/26/2020 2028    Lab Results  Component Value Date   LABMICR See below: 08/25/2020   WBCUA None seen 08/25/2020   LABEPIT None seen 08/25/2020   MUCUS Present 08/25/2020   BACTERIA None seen 08/25/2020    Pertinent Imaging: KUB today: Images reviewed and discussed with the patient Results for orders placed in visit on 08/25/20  Abdomen 1 view (KUB)  Narrative CLINICAL DATA:  Status post lithotripsy.  EXAM: ABDOMEN - 1 VIEW  COMPARISON:  August 08, 2020.  FINDINGS: The bowel gas pattern is normal. No radio-opaque calculi or other significant radiographic abnormality are seen. Left renal calculi noted on prior exam are not well visualized currently.  IMPRESSION: Left renal calculi noted on prior exam are not well visualized currently.   Electronically Signed By: Marijo Conception M.D. On: 08/25/2020 15:45  No results found for this or any previous visit.  No results found for this or any previous visit.  No results found for this or any previous visit.  No results found for this or any previous visit.  No results found for this or any previous visit.  No results found for this or any previous visit.  Results for orders placed during the hospital encounter of 07/26/20  CT Renal Stone Study  Narrative CLINICAL DATA:  Right flank pain, nephrolithiasis, hematuria  EXAM: CT ABDOMEN AND PELVIS WITHOUT CONTRAST  TECHNIQUE: Multidetector CT imaging of the abdomen and pelvis was performed following the standard protocol without IV contrast.  COMPARISON:  None.  FINDINGS: Lower chest: Mild right basilar scarring. Mild right coronary artery calcification. Global cardiac size within normal limits. No pericardial effusion.  Hepatobiliary: Cholecystectomy has been performed. Liver unremarkable. No intrahepatic biliary ductal dilation. Mild extrahepatic biliary ductal dilation  with the bile duct measuring 10 mm in diameter may represent post cholecystectomy change, but is nonspecific.  Pancreas: Unremarkable  Spleen: Unremarkable  Adrenals/Urinary Tract: The adrenal glands are unremarkable. The kidneys are normal in size and position. There are at least 4 nonobstructing calculi within the lower pole of the left kidney identified measuring up to 8 mm in greatest diameter. No hydronephrosis. No urolithiasis. No significant perinephric stranding or perinephric fluid collections identified. The bladder is unremarkable.  Stomach/Bowel: Surgical changes of Roux-en-Y gastric bypass are identified. Moderate stool is seen throughout the colon. The stomach, small bowel, and large bowel are otherwise unremarkable there is no evidence of obstruction or focal inflammation. The appendix is normal. No free intraperitoneal gas or fluid.  Vascular/Lymphatic: Moderate aortoiliac atherosclerotic calcification. No aortic aneurysm. No pathologic adenopathy within the abdomen and pelvis.  Reproductive: Prostate is unremarkable.  Other:  Rectum unremarkable.  No abdominal wall hernia identified.  Musculoskeletal: Mild to moderate lumbar levoscoliosis, apex left at L2. Degenerative changes are noted within the lumbar spine. No acute bone abnormality identified.  IMPRESSION: Nonobstructing left nephrolithiasis. No hydronephrosis. No urolithiasis.  Status post cholecystectomy. Mild dilation of the extrahepatic bile duct is nonspecific, but may represent post cholecystectomy change. Correlation with liver enzymes may be helpful to exclude an obstructive process.  No definite radiographic explanation for the patient's reported symptoms. No acute intra-abdominal pathology identified.  Aortic Atherosclerosis (ICD10-I70.0).   Electronically Signed By: Fidela Salisbury MD On: 07/26/2020 21:00   Assessment & Plan:    1. Kidney stones -RTC 2 weeks with KUB -  Urinalysis, Routine w reflex microscopic - Abdomen 1 view (KUB); Future   Return in about 2 weeks (around 09/08/2020) for KUB.  Nicolette Bang, MD  [facility] [place]

## 2020-09-08 MED FILL — CYCLOBENZAPRINE HCL 10 MG T: 10 | 20 days supply | Qty: 60 | Fill #3

## 2020-09-08 MED FILL — ELIQUIS 5 MG TABLET: 5 | 30 days supply | Qty: 60 | Fill #3

## 2020-09-15 ENCOUNTER — Ambulatory Visit: Payer: 59 | Admitting: Urology

## 2020-09-15 DIAGNOSIS — N200 Calculus of kidney: Secondary | ICD-10-CM

## 2020-09-20 ENCOUNTER — Ambulatory Visit: Payer: 59 | Admitting: Urology

## 2020-09-20 DIAGNOSIS — N200 Calculus of kidney: Secondary | ICD-10-CM

## 2020-09-22 MED FILL — FLUCONAZOLE 150 MG TABS: 150 | 28 days supply | Qty: 4 | Fill #1

## 2020-09-22 MED FILL — BUPREN-NALOX SL TAB 8-2MG: 8-2 | 30 days supply | Qty: 30 | Fill #1

## 2020-10-03 ENCOUNTER — Ambulatory Visit (HOSPITAL_COMMUNITY): Payer: 59

## 2020-10-03 ENCOUNTER — Ambulatory Visit (HOSPITAL_COMMUNITY)
Admission: RE | Admit: 2020-10-03 | Discharge: 2020-10-03 | Disposition: A | Discharge status: Home / Self Care | Payer: 59 | Source: Ambulatory Visit | Attending: Urology | Admitting: Urology

## 2020-10-03 ENCOUNTER — Other Ambulatory Visit: Payer: Self-pay

## 2020-10-03 DIAGNOSIS — N200 Calculus of kidney: Secondary | ICD-10-CM | POA: Diagnosis not present

## 2020-10-03 DIAGNOSIS — Z9889 Other specified postprocedural states: Secondary | ICD-10-CM | POA: Diagnosis not present

## 2020-10-04 ENCOUNTER — Other Ambulatory Visit (HOSPITAL_COMMUNITY): Payer: Self-pay | Admitting: Neurology

## 2020-10-04 DIAGNOSIS — F1120 Opioid dependence, uncomplicated: Secondary | ICD-10-CM | POA: Diagnosis not present

## 2020-10-04 DIAGNOSIS — M79605 Pain in left leg: Secondary | ICD-10-CM | POA: Diagnosis not present

## 2020-10-04 DIAGNOSIS — M545 Low back pain, unspecified: Secondary | ICD-10-CM | POA: Diagnosis not present

## 2020-10-04 DIAGNOSIS — Z79891 Long term (current) use of opiate analgesic: Secondary | ICD-10-CM | POA: Diagnosis not present

## 2020-10-04 DIAGNOSIS — M5459 Other low back pain: Secondary | ICD-10-CM | POA: Diagnosis not present

## 2020-10-04 DIAGNOSIS — G894 Chronic pain syndrome: Secondary | ICD-10-CM | POA: Diagnosis not present

## 2020-10-10 NOTE — Progress Notes (Signed)
Sent via [employer]

## 2020-10-12 DIAGNOSIS — I890 Lymphedema, not elsewhere classified: Secondary | ICD-10-CM | POA: Diagnosis not present

## 2020-10-12 DIAGNOSIS — I824Z2 Acute embolism and thrombosis of unspecified deep veins of left distal lower extremity: Secondary | ICD-10-CM | POA: Diagnosis not present

## 2020-10-18 ENCOUNTER — Other Ambulatory Visit: Payer: Self-pay

## 2020-10-18 ENCOUNTER — Ambulatory Visit (INDEPENDENT_AMBULATORY_CARE_PROVIDER_SITE_OTHER): Payer: 59 | Admitting: Urology

## 2020-10-18 ENCOUNTER — Other Ambulatory Visit: Payer: Self-pay | Admitting: Urology

## 2020-10-18 VITALS — BP 117/72 | HR 63 | Temp 97.6°F | Ht 76.0 in | Wt 217.0 lb

## 2020-10-18 DIAGNOSIS — N401 Enlarged prostate with lower urinary tract symptoms: Secondary | ICD-10-CM | POA: Diagnosis not present

## 2020-10-18 DIAGNOSIS — N200 Calculus of kidney: Secondary | ICD-10-CM

## 2020-10-18 DIAGNOSIS — N138 Other obstructive and reflux uropathy: Secondary | ICD-10-CM | POA: Diagnosis not present

## 2020-10-18 DIAGNOSIS — R351 Nocturia: Secondary | ICD-10-CM

## 2020-10-18 LAB — MICROSCOPIC EXAMINATION
Bacteria, UA: NONE SEEN
Epithelial Cells (non renal): NONE SEEN /hpf (ref 0–10)
Renal Epithel, UA: NONE SEEN /hpf
WBC, UA: NONE SEEN /hpf (ref 0–5)

## 2020-10-18 LAB — URINALYSIS, ROUTINE W REFLEX MICROSCOPIC
Bilirubin, UA: NEGATIVE
Glucose, UA: NEGATIVE
Ketones, UA: NEGATIVE
Leukocytes,UA: NEGATIVE
Nitrite, UA: NEGATIVE
Protein,UA: NEGATIVE
Specific Gravity, UA: 1.015 (ref 1.005–1.030)
Urobilinogen, Ur: 1 mg/dL (ref 0.2–1.0)
pH, UA: 7.5 (ref 5.0–7.5)

## 2020-10-18 MED ORDER — TAMSULOSIN HCL 0.4 MG PO CAPS: 0.4000 mg | ORAL_CAPSULE | Freq: Every day | ORAL | 11 refills | Status: DC

## 2020-10-18 MED FILL — TAMSULOSIN HCL 0.4 MG CAP: 0.4 | 90 days supply | Qty: 90 | Fill #0

## 2020-10-18 MED FILL — CYCLOBENZAPRINE HCL 10 MG T: 10 | 20 days supply | Qty: 60 | Fill #4

## 2020-10-18 NOTE — Progress Notes (Signed)
10/18/2020 9:33 AM   [name] 11/10/1970 [id number]  Referring provider: Celene Squibb, MD [address] [name],  [place] [address]  Nephrolithiasis  HPI: [name] is a 50yo here for followup for nephrolithiasis. No flank pain. He passed a couple of fragments since last visit. KUB from 10/04/2020 shows no calculi. He has decreased tea intake. He drinks 64-70ox daily. Since last visit he has noted bothersome nocturia. He denies afternoon caffeine intake. NO fluid within 2 hours of bed. He has associated weak urinary stream. He is unhappy with his urination.     PMH: Past Medical History:  Diagnosis Date  . Arthritis   . Clotting disorder ([place])    blood clot/dvt  . History of kidney stones   . PAD (peripheral artery disease) ([place])   . Varicose veins of both lower extremities     Surgical History: Past Surgical History:  Procedure Laterality Date  . CHOLECYSTECTOMY  2004  . EXTRACORPOREAL SHOCK WAVE LITHOTRIPSY Left 08/08/2020   Procedure: EXTRACORPOREAL SHOCK WAVE LITHOTRIPSY (ESWL);  Surgeon: Cleon Gustin, MD;  Location: [facility];  Service: Urology;  Laterality: Left;  Marland Kitchen GASTRIC BYPASS  2005    Home Medications:  Allergies as of 10/18/2020      Reactions   Ciprofloxacin    Altered mental state   Nsaids Nausea And Vomiting   Upset stomach       Medication List       Accurate as of October 18, 2020  9:33 AM. If you have any questions, ask your nurse or doctor.        buprenorphine-naloxone 8-2 mg Subl SL tablet Commonly known as: SUBOXONE Place 1 tablet under the tongue daily.   cyclobenzaprine 10 MG tablet Commonly known as: FLEXERIL Take 10 mg by mouth 3 (three) times daily as needed.   Eliquis 5 MG Tabs tablet Generic drug: apixaban Take 5 mg by mouth 2 (two) times daily.   fluconazole 150 MG tablet Commonly known as: DIFLUCAN TAKE 1 TABLET BY MOUTH ONCE A WEEK   multivitamin with minerals Tabs tablet Take 1 tablet by mouth daily.    traMADol 50 MG tablet Commonly known as: Ultram Take 1 tablet (50 mg total) by mouth every 6 (six) hours as needed.       Allergies:  Allergies  Allergen Reactions  . Ciprofloxacin     Altered mental state  . Nsaids Nausea And Vomiting    Upset stomach     Family History: Family History  Problem Relation Age of Onset  . Diabetes Father   . Heart disease Father   . Colon cancer Neg Hx   . Esophageal cancer Neg Hx   . Rectal cancer Neg Hx   . Stomach cancer Neg Hx     Social History:  reports that he has never smoked. He has never used smokeless tobacco. He reports current alcohol use of about 1.0 standard drink of alcohol per week. He reports that he does not use drugs.  ROS: All other review of systems were reviewed and are negative except what is noted above in HPI  Physical Exam: BP 117/72   Pulse 63   Temp 97.6 F (36.4 C)   Ht 6\' 4"  (1.93 m)   Wt 217 lb (98.4 kg)   BMI 26.41 kg/m   Constitutional:  Alert and oriented, No acute distress. HEENT: [place] AT, moist mucus membranes.  Trachea midline, no masses. Cardiovascular: No clubbing, cyanosis, or edema. Respiratory:  Normal respiratory effort, no increased work of breathing. GI: Abdomen is soft, nontender, nondistended, no abdominal masses GU: No CVA tenderness.  Lymph: No cervical or inguinal lymphadenopathy. Skin: No rashes, bruises or suspicious lesions. Neurologic: Grossly intact, no focal deficits, moving all 4 extremities. Psychiatric: Normal mood and affect.  Laboratory Data: Lab Results  Component Value Date   WBC 5.4 09/08/2019   HGB 14.4 09/08/2019   HCT 42.8 09/08/2019   MCV 91.8 09/08/2019   PLT 172.0 09/08/2019    Lab Results  Component Value Date   CREATININE 0.85 07/26/2020    No results found for: PSA  No results found for: TESTOSTERONE  No results found for: HGBA1C  Urinalysis    Component Value Date/Time   COLORURINE YELLOW 07/26/2020 2028   APPEARANCEUR Clear 08/25/2020  1338   LABSPEC 1.014 07/26/2020 2028   PHURINE 7.0 07/26/2020 2028   GLUCOSEU Negative 08/25/2020 1338   HGBUR MODERATE (A) 07/26/2020 2028   BILIRUBINUR Negative 08/25/2020 [place] 07/26/2020 2028   PROTEINUR Negative 08/25/2020 [place] NEGATIVE 07/26/2020 2028   NITRITE Negative 08/25/2020 1338   NITRITE NEGATIVE 07/26/2020 2028   LEUKOCYTESUR Negative 08/25/2020 1338   LEUKOCYTESUR NEGATIVE 07/26/2020 2028    Lab Results  Component Value Date   LABMICR See below: 08/25/2020   WBCUA None seen 08/25/2020   LABEPIT None seen 08/25/2020   MUCUS Present 08/25/2020   BACTERIA None seen 08/25/2020    Pertinent Imaging: KUB 10/04/2020: IMages reviewed and discussed with the patient Results for orders placed during the hospital encounter of 10/03/20  Abdomen 1 view (KUB)  Narrative CLINICAL DATA:  Status post lithotripsy.  EXAM: ABDOMEN - 1 VIEW  COMPARISON:  August 25, 2020  FINDINGS: The bowel gas pattern is normal. A large amount of stool is seen throughout the colon. No radio-opaque calculi or other significant radiographic abnormality are seen. Radiopaque surgical clips are seen overlying the bilateral upper quadrants.  IMPRESSION: 1. No evidence of radiopaque renal calculi. 2. Large stool burden without evidence of bowel obstruction.   Electronically Signed By: Virgina Norfolk M.D. On: 10/05/2020 03:26  No results found for this or any previous visit.  No results found for this or any previous visit.  No results found for this or any previous visit.  No results found for this or any previous visit.  No results found for this or any previous visit.  No results found for this or any previous visit.  Results for orders placed during the hospital encounter of 07/26/20  CT Renal Stone Study  Narrative CLINICAL DATA:  Right flank pain, nephrolithiasis, hematuria  EXAM: CT ABDOMEN AND PELVIS WITHOUT  CONTRAST  TECHNIQUE: Multidetector CT imaging of the abdomen and pelvis was performed following the standard protocol without IV contrast.  COMPARISON:  None.  FINDINGS: Lower chest: Mild right basilar scarring. Mild right coronary artery calcification. Global cardiac size within normal limits. No pericardial effusion.  Hepatobiliary: Cholecystectomy has been performed. Liver unremarkable. No intrahepatic biliary ductal dilation. Mild extrahepatic biliary ductal dilation with the bile duct measuring 10 mm in diameter may represent post cholecystectomy change, but is nonspecific.  Pancreas: Unremarkable  Spleen: Unremarkable  Adrenals/Urinary Tract: The adrenal glands are unremarkable. The kidneys are normal in size and position. There are at least 4 nonobstructing calculi within the lower pole of the left kidney identified measuring up to 8 mm in greatest diameter. No hydronephrosis. No urolithiasis. No significant perinephric stranding or perinephric fluid collections  identified. The bladder is unremarkable.  Stomach/Bowel: Surgical changes of Roux-en-Y gastric bypass are identified. Moderate stool is seen throughout the colon. The stomach, small bowel, and large bowel are otherwise unremarkable there is no evidence of obstruction or focal inflammation. The appendix is normal. No free intraperitoneal gas or fluid.  Vascular/Lymphatic: Moderate aortoiliac atherosclerotic calcification. No aortic aneurysm. No pathologic adenopathy within the abdomen and pelvis.  Reproductive: Prostate is unremarkable.  Other: Rectum unremarkable.  No abdominal wall hernia identified.  Musculoskeletal: Mild to moderate lumbar levoscoliosis, apex left at L2. Degenerative changes are noted within the lumbar spine. No acute bone abnormality identified.  IMPRESSION: Nonobstructing left nephrolithiasis. No hydronephrosis. No urolithiasis.  Status post cholecystectomy. Mild dilation of  the extrahepatic bile duct is nonspecific, but may represent post cholecystectomy change. Correlation with liver enzymes may be helpful to exclude an obstructive process.  No definite radiographic explanation for the patient's reported symptoms. No acute intra-abdominal pathology identified.  Aortic Atherosclerosis (ICD10-I70.0).   Electronically Signed By: Fidela Salisbury MD On: 07/26/2020 21:00   Assessment & Plan:    1. Kidney stones -dietary handout given -RTC 6 months with KUB - Urinalysis, Routine w reflex microscopic  2. BPH with LUTS, nocturia -flomax 0.4mg  daily   No follow-ups on file.  Nicolette Bang, MD  [facility] [place]

## 2020-10-18 NOTE — Progress Notes (Signed)
Urological Symptom Review  Patient is experiencing the following symptoms: Get up at night to urinate Trouble starting stream   Review of Systems  Gastrointestinal (upper)  : Negative for upper GI symptoms  Gastrointestinal (lower) : Negative for lower GI symptoms  Constitutional : Negative for symptoms  Skin: Negative for skin symptoms  Eyes: Negative for eye symptoms  Ear/Nose/Throat : Negative for Ear/Nose/Throat symptoms  Hematologic/Lymphatic: Negative for Hematologic/Lymphatic symptoms  Cardiovascular : Negative for cardiovascular symptoms  Respiratory : Negative for respiratory symptoms  Endocrine: Negative for endocrine symptoms  Musculoskeletal: Back pain  Neurological: Negative for neurological symptoms  Psychologic: Negative for psychiatric symptoms  

## 2020-10-20 MED FILL — BUPRENORPHIN-NALOXON 8-2 MG: 8-2 | 30 days supply | Qty: 30 | Fill #0

## 2020-10-24 ENCOUNTER — Encounter: Payer: Self-pay | Admitting: Urology

## 2020-10-24 NOTE — Patient Instructions (Signed)

## 2020-10-26 ENCOUNTER — Other Ambulatory Visit: Payer: Self-pay | Admitting: Podiatry

## 2020-10-30 ENCOUNTER — Other Ambulatory Visit (HOSPITAL_BASED_OUTPATIENT_CLINIC_OR_DEPARTMENT_OTHER): Payer: Self-pay

## 2020-11-20 ENCOUNTER — Other Ambulatory Visit (HOSPITAL_COMMUNITY): Payer: Self-pay

## 2020-11-20 MED FILL — Cyclobenzaprine HCl Tab 10 MG: ORAL | 20 days supply | Qty: 60 | Fill #0 | Status: AC

## 2020-11-20 MED FILL — Fluconazole Tab 150 MG: ORAL | 28 days supply | Qty: 4 | Fill #0 | Status: AC

## 2020-11-21 ENCOUNTER — Other Ambulatory Visit (HOSPITAL_COMMUNITY): Payer: Self-pay

## 2020-11-22 ENCOUNTER — Other Ambulatory Visit (HOSPITAL_COMMUNITY): Payer: Self-pay

## 2020-11-23 ENCOUNTER — Other Ambulatory Visit (HOSPITAL_COMMUNITY): Payer: Self-pay

## 2020-11-24 ENCOUNTER — Other Ambulatory Visit: Payer: Self-pay | Admitting: Nurse Practitioner

## 2020-11-24 ENCOUNTER — Other Ambulatory Visit (HOSPITAL_COMMUNITY): Payer: Self-pay | Admitting: Nurse Practitioner

## 2020-11-24 DIAGNOSIS — M79604 Pain in right leg: Secondary | ICD-10-CM

## 2020-11-24 DIAGNOSIS — I825Y9 Chronic embolism and thrombosis of unspecified deep veins of unspecified proximal lower extremity: Secondary | ICD-10-CM

## 2020-11-24 DIAGNOSIS — M79605 Pain in left leg: Secondary | ICD-10-CM

## 2020-11-25 ENCOUNTER — Other Ambulatory Visit (HOSPITAL_COMMUNITY): Payer: Self-pay

## 2020-11-29 ENCOUNTER — Other Ambulatory Visit (HOSPITAL_COMMUNITY): Payer: Self-pay

## 2020-12-01 ENCOUNTER — Other Ambulatory Visit: Payer: Self-pay

## 2020-12-01 ENCOUNTER — Other Ambulatory Visit (HOSPITAL_COMMUNITY): Payer: Self-pay | Admitting: Nurse Practitioner

## 2020-12-01 ENCOUNTER — Ambulatory Visit (HOSPITAL_COMMUNITY)
Admission: RE | Admit: 2020-12-01 | Discharge: 2020-12-01 | Disposition: A | Discharge status: Home / Self Care | Payer: 59 | Source: Ambulatory Visit | Attending: Nurse Practitioner | Admitting: Nurse Practitioner

## 2020-12-01 DIAGNOSIS — I825Y9 Chronic embolism and thrombosis of unspecified deep veins of unspecified proximal lower extremity: Secondary | ICD-10-CM

## 2020-12-01 DIAGNOSIS — M79604 Pain in right leg: Secondary | ICD-10-CM | POA: Insufficient documentation

## 2020-12-01 DIAGNOSIS — R600 Localized edema: Secondary | ICD-10-CM | POA: Diagnosis not present

## 2020-12-01 DIAGNOSIS — M79605 Pain in left leg: Secondary | ICD-10-CM | POA: Insufficient documentation

## 2020-12-01 DIAGNOSIS — N200 Calculus of kidney: Secondary | ICD-10-CM | POA: Diagnosis not present

## 2020-12-01 MED ORDER — IOHEXOL 350 MG/ML SOLN
125.0000 mL | Freq: Once | INTRAVENOUS | Status: AC | PRN
  Administered 2020-12-01: 125 mL via INTRAVENOUS

## 2020-12-04 ENCOUNTER — Other Ambulatory Visit (HOSPITAL_COMMUNITY): Payer: Self-pay

## 2020-12-04 ENCOUNTER — Other Ambulatory Visit: Payer: Self-pay | Admitting: Neurology

## 2020-12-04 MED FILL — Buprenorphine HCl-Naloxone HCl SL Tab 8-2 MG (Base Equiv): SUBLINGUAL | 30 days supply | Qty: 30 | Fill #0 | Status: AC

## 2020-12-06 ENCOUNTER — Other Ambulatory Visit (HOSPITAL_COMMUNITY): Payer: Self-pay

## 2020-12-06 DIAGNOSIS — G894 Chronic pain syndrome: Secondary | ICD-10-CM | POA: Diagnosis not present

## 2020-12-06 DIAGNOSIS — M79605 Pain in left leg: Secondary | ICD-10-CM | POA: Diagnosis not present

## 2020-12-06 DIAGNOSIS — F1120 Opioid dependence, uncomplicated: Secondary | ICD-10-CM | POA: Diagnosis not present

## 2020-12-06 DIAGNOSIS — Z79891 Long term (current) use of opiate analgesic: Secondary | ICD-10-CM | POA: Diagnosis not present

## 2020-12-06 DIAGNOSIS — M5459 Other low back pain: Secondary | ICD-10-CM | POA: Diagnosis not present

## 2020-12-07 ENCOUNTER — Other Ambulatory Visit: Payer: Self-pay | Admitting: Neurology

## 2020-12-07 ENCOUNTER — Other Ambulatory Visit (HOSPITAL_COMMUNITY): Payer: Self-pay

## 2020-12-12 DIAGNOSIS — Z0001 Encounter for general adult medical examination with abnormal findings: Secondary | ICD-10-CM | POA: Diagnosis not present

## 2020-12-12 DIAGNOSIS — M539 Dorsopathy, unspecified: Secondary | ICD-10-CM | POA: Diagnosis not present

## 2020-12-12 DIAGNOSIS — I872 Venous insufficiency (chronic) (peripheral): Secondary | ICD-10-CM | POA: Diagnosis not present

## 2020-12-12 DIAGNOSIS — Z9884 Bariatric surgery status: Secondary | ICD-10-CM | POA: Diagnosis not present

## 2020-12-14 DIAGNOSIS — I890 Lymphedema, not elsewhere classified: Secondary | ICD-10-CM | POA: Diagnosis not present

## 2020-12-14 DIAGNOSIS — M79652 Pain in left thigh: Secondary | ICD-10-CM | POA: Diagnosis not present

## 2020-12-14 DIAGNOSIS — M79605 Pain in left leg: Secondary | ICD-10-CM | POA: Diagnosis not present

## 2020-12-14 DIAGNOSIS — R600 Localized edema: Secondary | ICD-10-CM | POA: Diagnosis not present

## 2020-12-25 ENCOUNTER — Other Ambulatory Visit: Payer: Self-pay | Admitting: Neurology

## 2020-12-26 ENCOUNTER — Other Ambulatory Visit (HOSPITAL_COMMUNITY): Payer: Self-pay

## 2020-12-27 ENCOUNTER — Other Ambulatory Visit: Payer: Self-pay | Admitting: Neurology

## 2020-12-27 ENCOUNTER — Other Ambulatory Visit (HOSPITAL_COMMUNITY): Payer: Self-pay

## 2020-12-28 ENCOUNTER — Other Ambulatory Visit (HOSPITAL_COMMUNITY): Payer: Self-pay

## 2020-12-29 ENCOUNTER — Other Ambulatory Visit (HOSPITAL_COMMUNITY): Payer: Self-pay

## 2020-12-29 MED ORDER — CYCLOBENZAPRINE HCL 10 MG PO TABS
1.0000 | ORAL_TABLET | Freq: Three times a day (TID) | ORAL | 5 refills | Status: DC | PRN
  Filled 2020-12-29: qty 60, 20d supply, fill #0
  Filled 2021-03-12: qty 60, 20d supply, fill #1
  Filled 2021-04-24: qty 60, 20d supply, fill #2
  Filled 2021-05-15 – 2021-05-25 (×2): qty 60, 20d supply, fill #3
  Filled 2021-06-28: qty 60, 20d supply, fill #4
  Filled 2021-07-18: qty 60, 20d supply, fill #5

## 2020-12-30 ENCOUNTER — Other Ambulatory Visit (HOSPITAL_COMMUNITY): Payer: Self-pay

## 2020-12-30 ENCOUNTER — Other Ambulatory Visit: Payer: Self-pay | Admitting: Neurology

## 2021-01-01 ENCOUNTER — Other Ambulatory Visit (HOSPITAL_COMMUNITY): Payer: Self-pay

## 2021-01-03 ENCOUNTER — Other Ambulatory Visit (HOSPITAL_COMMUNITY): Payer: Self-pay

## 2021-01-23 DIAGNOSIS — M79605 Pain in left leg: Secondary | ICD-10-CM | POA: Diagnosis not present

## 2021-01-23 DIAGNOSIS — R278 Other lack of coordination: Secondary | ICD-10-CM | POA: Diagnosis not present

## 2021-01-23 DIAGNOSIS — F1120 Opioid dependence, uncomplicated: Secondary | ICD-10-CM | POA: Diagnosis not present

## 2021-01-23 DIAGNOSIS — G894 Chronic pain syndrome: Secondary | ICD-10-CM | POA: Diagnosis not present

## 2021-01-23 DIAGNOSIS — Z79891 Long term (current) use of opiate analgesic: Secondary | ICD-10-CM | POA: Diagnosis not present

## 2021-01-23 DIAGNOSIS — M5459 Other low back pain: Secondary | ICD-10-CM | POA: Diagnosis not present

## 2021-01-24 DIAGNOSIS — I83893 Varicose veins of bilateral lower extremities with other complications: Secondary | ICD-10-CM | POA: Diagnosis not present

## 2021-01-24 DIAGNOSIS — I83813 Varicose veins of bilateral lower extremities with pain: Secondary | ICD-10-CM | POA: Diagnosis not present

## 2021-01-24 DIAGNOSIS — I872 Venous insufficiency (chronic) (peripheral): Secondary | ICD-10-CM | POA: Diagnosis not present

## 2021-01-24 DIAGNOSIS — I890 Lymphedema, not elsewhere classified: Secondary | ICD-10-CM | POA: Diagnosis not present

## 2021-01-29 ENCOUNTER — Other Ambulatory Visit: Payer: Self-pay

## 2021-01-29 DIAGNOSIS — R208 Other disturbances of skin sensation: Secondary | ICD-10-CM

## 2021-02-07 DIAGNOSIS — M79652 Pain in left thigh: Secondary | ICD-10-CM | POA: Diagnosis not present

## 2021-02-07 DIAGNOSIS — I83813 Varicose veins of bilateral lower extremities with pain: Secondary | ICD-10-CM | POA: Diagnosis not present

## 2021-02-07 DIAGNOSIS — I872 Venous insufficiency (chronic) (peripheral): Secondary | ICD-10-CM | POA: Diagnosis not present

## 2021-02-07 DIAGNOSIS — I83893 Varicose veins of bilateral lower extremities with other complications: Secondary | ICD-10-CM | POA: Diagnosis not present

## 2021-02-14 ENCOUNTER — Ambulatory Visit (HOSPITAL_COMMUNITY)
Admission: RE | Admit: 2021-02-14 | Discharge: 2021-02-14 | Disposition: A | Discharge status: Home / Self Care | Payer: 59 | Source: Ambulatory Visit | Attending: Vascular Surgery | Admitting: Vascular Surgery

## 2021-02-14 ENCOUNTER — Other Ambulatory Visit: Payer: Self-pay

## 2021-02-14 ENCOUNTER — Encounter: Payer: Self-pay | Admitting: Vascular Surgery

## 2021-02-14 ENCOUNTER — Ambulatory Visit (INDEPENDENT_AMBULATORY_CARE_PROVIDER_SITE_OTHER)
Admission: RE | Admit: 2021-02-14 | Discharge: 2021-02-14 | Disposition: A | Discharge status: Home / Self Care | Payer: 59 | Source: Ambulatory Visit | Attending: Vascular Surgery | Admitting: Vascular Surgery

## 2021-02-14 ENCOUNTER — Ambulatory Visit (INDEPENDENT_AMBULATORY_CARE_PROVIDER_SITE_OTHER): Payer: 59 | Admitting: Vascular Surgery

## 2021-02-14 VITALS — BP 122/76 | HR 63 | Temp 98.1°F | Resp 20 | Ht 76.0 in | Wt 215.0 lb

## 2021-02-14 DIAGNOSIS — I872 Venous insufficiency (chronic) (peripheral): Secondary | ICD-10-CM

## 2021-02-14 DIAGNOSIS — R208 Other disturbances of skin sensation: Secondary | ICD-10-CM

## 2021-02-14 NOTE — Progress Notes (Signed)
REASON FOR VISIT:   Rule out arterial insufficiency.  The consult is requested by Dr. Aleda Grana  MEDICAL ISSUES:   BILATERAL LEG CRAMPS: This patient's chief complaint is bilateral leg cramps.  I reassured him that based on his physical exam, arterial Doppler study today and arterial duplex study today that he has no evidence of arterial insufficiency.  I think some of his symptoms could potentially be related to his venous disease.  This is being followed by Dr. Aleda Grana.  He states that leg elevation does not necessarily help his symptoms so the cramping may not be related just to his venous disease.  If he has persistent problems I explained that I have to follow-up with his primary care physician to rule out any electrolyte abnormalities.  He has undergone previous gastric bypass surgery and had previous problems with malabsorption so certainly this would be in the differential diagnosis.  We have also discussed the use of coconut water for tonic water which has quinine which is sometimes helpful for people with leg cramps.  Again I reassured him that he has no evidence of arterial insufficiency.  CHRONIC VENOUS INSUFFICIENCY: He does have a history of chronic venous insufficiency and he has known deep venous reflux.  We have discussed the importance of intermittent leg elevation and the proper positioning for this.  I encouraged him to wear his compression stockings when he is on his feet.  I have encouraged him to avoid prolonged sitting and standing.  We discussed importance of exercise specifically walking and water aerobics.  HPI:   Paul Sawyer is a pleasant 50 y.o. male who I had seen back in September 2020 with some chronic venous insufficiency.  He had some superficial venous reflux in the left great saphenous vein but the vein was not especially dilated.  He also had some deep venous reflux on the left.  On the right side he had no evidence of significant venous disease.   We discussed conservative measures.  I was to see him back as needed.  He was seen by Dr. Vonita Moss on 12/14/2020 and was referred with claudication.  On my history, the patient's chief complaint is cramps in both legs which he has had for several months now.  He denies any history of claudication, rest pain, or nonhealing ulcers.  Sounds like he had foam sclerotherapy by Dr. Aleda Grana and did develop a DVT in the left leg which was treated with apixaban which she is no longer on.  Past Medical History:  Diagnosis Date   Arthritis    Clotting disorder ([place])    blood clot/dvt   History of kidney stones    PAD (peripheral artery disease) (HCC)    Varicose veins of both lower extremities     Family History  Problem Relation Age of Onset   Diabetes Father    Heart disease Father    Colon cancer Neg Hx    Esophageal cancer Neg Hx    Rectal cancer Neg Hx    Stomach cancer Neg Hx     SOCIAL HISTORY: Social History   Tobacco Use   Smoking status: Never   Smokeless tobacco: Never  Substance Use Topics   Alcohol use: Yes    Alcohol/week: 1.0 standard drink    Types: 1 Glasses of wine per week    Comment: weekly    Allergies  Allergen Reactions   Ciprofloxacin     Altered mental state   Nsaids Nausea And Vomiting  Upset stomach     Current Outpatient Medications  Medication Sig Dispense Refill   apixaban (ELIQUIS) 5 MG TABS tablet TAKE 1 TABLET BY MOUTH 2 TIMES DAILY 60 tablet 3   buprenorphine-naloxone (SUBOXONE) 8-2 mg SUBL SL tablet Place 1 tablet under the tongue daily.     cyclobenzaprine (FLEXERIL) 10 MG tablet Take 10 mg by mouth 3 (three) times daily as needed.     cyclobenzaprine (FLEXERIL) 10 MG tablet TAKE 1 TABLET BY MOUTH THREE TIMES DAILY AS NEEDED 60 tablet 5   ELIQUIS 5 MG TABS tablet Take 5 mg by mouth 2 (two) times daily.     fluconazole (DIFLUCAN) 150 MG tablet TAKE 1 TABLET BY MOUTH ONCE A WEEK 4 tablet 1   gabapentin (NEURONTIN) 300 MG capsule  TAKE 1 CAPSULE BY MOUTH THREE TIMES DAILY 90 capsule 4   Multiple Vitamin (MULTIVITAMIN WITH MINERALS) TABS tablet Take 1 tablet by mouth daily.     tamsulosin (FLOMAX) 0.4 MG CAPS capsule TAKE 1 CAPSULE (0.4 MG TOTAL) BY MOUTH DAILY. 30 capsule 11   traMADol (ULTRAM) 50 MG tablet Take 1 tablet (50 mg total) by mouth every 6 (six) hours as needed. 20 tablet 0   No current facility-administered medications for this visit.    REVIEW OF SYSTEMS:  [X]  denotes positive finding, [ ]  denotes negative finding Cardiac  Comments:  Chest pain or chest pressure:    Shortness of breath upon exertion:    Short of breath when lying flat:    Irregular heart rhythm:        Vascular    Pain in calf, thigh, or hip brought on by ambulation:    Pain in feet at night that wakes you up from your sleep:     Blood clot in your veins:    Leg swelling:         Pulmonary    Oxygen at home:    Productive cough:     Wheezing:         Neurologic    Sudden weakness in arms or legs:     Sudden numbness in arms or legs:     Sudden onset of difficulty speaking or slurred speech:    Temporary loss of vision in one eye:     Problems with dizziness:         Gastrointestinal    Blood in stool:     Vomited blood:         Genitourinary    Burning when urinating:     Blood in urine:        Psychiatric    Major depression:         Hematologic    Bleeding problems:    Problems with blood clotting too easily:        Skin    Rashes or ulcers:        Constitutional    Fever or chills:     PHYSICAL EXAM:   Vitals:   02/14/21 1107  BP: 122/76  Pulse: 63  Resp: 20  Temp: 98.1 F (36.7 C)  SpO2: 97%  Weight: 215 lb (97.5 kg)  Height: 6\' 4"  (1.93 m)    GENERAL: The patient is a well-nourished male, in no acute distress. The vital signs are documented above. CARDIAC: There is a regular rate and rhythm.  VASCULAR: I do not detect carotid bruits. He has palpable femoral, dorsalis pedis, posterior  tibial pulses bilaterally. He has mild bilateral lower extremity swelling.  PULMONARY: There is good air exchange bilaterally without wheezing or rales. ABDOMEN: Soft and non-tender with normal pitched bowel sounds.  MUSCULOSKELETAL: There are no major deformities or cyanosis. NEUROLOGIC: No focal weakness or paresthesias are detected. SKIN: There are no ulcers or rashes noted. PSYCHIATRIC: The patient has a normal affect.  DATA:    ARTERIAL DUPLEX: I have independently interpreted his arterial duplex scan of both lower extremities today.  On the right side he has triphasic flow in the entire right lower extremity including the tibial vessels.  There is no evidence of significant arterial occlusive disease.  On the left side he has triphasic flow in the entire left lower extremity including the tibial vessels.  There is no evidence of significant arterial occlusive disease.  ARTERIAL DOPPLER STUDY: I have independently interpreted his arterial Doppler study today.  On the right side there is a triphasic dorsalis pedis and posterior tibial signal.  ABI is 100%.  Toe pressures 188 mmHg.  On the left side there is a triphasic dorsalis pedis and posterior tibial signal.  ABIs 100%.  Toe pressures 148 mmHg.   [name] [facility] [phone]

## 2021-02-20 DIAGNOSIS — I8311 Varicose veins of right lower extremity with inflammation: Secondary | ICD-10-CM | POA: Diagnosis not present

## 2021-02-20 DIAGNOSIS — I890 Lymphedema, not elsewhere classified: Secondary | ICD-10-CM | POA: Diagnosis not present

## 2021-02-20 DIAGNOSIS — I8312 Varicose veins of left lower extremity with inflammation: Secondary | ICD-10-CM | POA: Diagnosis not present

## 2021-03-12 ENCOUNTER — Other Ambulatory Visit (HOSPITAL_COMMUNITY): Payer: Self-pay

## 2021-03-14 ENCOUNTER — Other Ambulatory Visit (HOSPITAL_COMMUNITY): Payer: Self-pay

## 2021-03-14 DIAGNOSIS — M5459 Other low back pain: Secondary | ICD-10-CM | POA: Diagnosis not present

## 2021-03-14 DIAGNOSIS — Z79891 Long term (current) use of opiate analgesic: Secondary | ICD-10-CM | POA: Diagnosis not present

## 2021-03-14 DIAGNOSIS — M79605 Pain in left leg: Secondary | ICD-10-CM | POA: Diagnosis not present

## 2021-03-14 DIAGNOSIS — G894 Chronic pain syndrome: Secondary | ICD-10-CM | POA: Diagnosis not present

## 2021-03-14 DIAGNOSIS — F1120 Opioid dependence, uncomplicated: Secondary | ICD-10-CM | POA: Diagnosis not present

## 2021-03-14 DIAGNOSIS — M545 Low back pain, unspecified: Secondary | ICD-10-CM | POA: Diagnosis not present

## 2021-03-14 MED ORDER — BUPRENORPHINE HCL-NALOXONE HCL 8-2 MG SL SUBL
SUBLINGUAL_TABLET | SUBLINGUAL | 0 refills | Status: DC
  Filled 2021-03-14: qty 3, 6d supply, fill #0

## 2021-03-21 ENCOUNTER — Other Ambulatory Visit (HOSPITAL_COMMUNITY): Payer: Self-pay

## 2021-03-21 DIAGNOSIS — Z79891 Long term (current) use of opiate analgesic: Secondary | ICD-10-CM | POA: Diagnosis not present

## 2021-03-21 DIAGNOSIS — M79605 Pain in left leg: Secondary | ICD-10-CM | POA: Diagnosis not present

## 2021-03-21 DIAGNOSIS — G894 Chronic pain syndrome: Secondary | ICD-10-CM | POA: Diagnosis not present

## 2021-03-21 DIAGNOSIS — M5459 Other low back pain: Secondary | ICD-10-CM | POA: Diagnosis not present

## 2021-03-21 DIAGNOSIS — F1120 Opioid dependence, uncomplicated: Secondary | ICD-10-CM | POA: Diagnosis not present

## 2021-03-21 MED ORDER — BUPRENORPHINE HCL-NALOXONE HCL 8-2 MG SL SUBL
SUBLINGUAL_TABLET | SUBLINGUAL | 0 refills | Status: DC
  Filled 2021-03-21: qty 14, 14d supply, fill #0

## 2021-03-23 ENCOUNTER — Other Ambulatory Visit: Payer: Self-pay

## 2021-03-23 ENCOUNTER — Ambulatory Visit (HOSPITAL_COMMUNITY)
Admission: RE | Admit: 2021-03-23 | Discharge: 2021-03-23 | Disposition: A | Discharge status: Home / Self Care | Payer: 59 | Source: Ambulatory Visit | Attending: Internal Medicine | Admitting: Internal Medicine

## 2021-03-23 DIAGNOSIS — I890 Lymphedema, not elsewhere classified: Secondary | ICD-10-CM | POA: Diagnosis not present

## 2021-03-23 DIAGNOSIS — I10 Essential (primary) hypertension: Secondary | ICD-10-CM | POA: Diagnosis not present

## 2021-03-23 DIAGNOSIS — R002 Palpitations: Secondary | ICD-10-CM | POA: Insufficient documentation

## 2021-03-23 DIAGNOSIS — M539 Dorsopathy, unspecified: Secondary | ICD-10-CM | POA: Diagnosis not present

## 2021-03-23 DIAGNOSIS — E559 Vitamin D deficiency, unspecified: Secondary | ICD-10-CM | POA: Diagnosis not present

## 2021-04-04 ENCOUNTER — Other Ambulatory Visit (HOSPITAL_COMMUNITY): Payer: Self-pay

## 2021-04-04 DIAGNOSIS — M79604 Pain in right leg: Secondary | ICD-10-CM | POA: Diagnosis not present

## 2021-04-04 DIAGNOSIS — I890 Lymphedema, not elsewhere classified: Secondary | ICD-10-CM | POA: Diagnosis not present

## 2021-04-04 DIAGNOSIS — M79605 Pain in left leg: Secondary | ICD-10-CM | POA: Diagnosis not present

## 2021-04-04 MED ORDER — KETOPROFEN 50 MG PO CAPS
50.0000 mg | ORAL_CAPSULE | Freq: Two times a day (BID) | ORAL | 1 refills | Status: DC | PRN
Start: 2021-04-04 — End: 2021-06-12
  Filled 2021-04-04: qty 60, 30d supply, fill #0

## 2021-04-06 ENCOUNTER — Other Ambulatory Visit (HOSPITAL_COMMUNITY): Payer: Self-pay

## 2021-04-06 DIAGNOSIS — G894 Chronic pain syndrome: Secondary | ICD-10-CM | POA: Diagnosis not present

## 2021-04-06 DIAGNOSIS — M79605 Pain in left leg: Secondary | ICD-10-CM | POA: Diagnosis not present

## 2021-04-06 DIAGNOSIS — M545 Low back pain, unspecified: Secondary | ICD-10-CM | POA: Diagnosis not present

## 2021-04-06 DIAGNOSIS — M5459 Other low back pain: Secondary | ICD-10-CM | POA: Diagnosis not present

## 2021-04-06 DIAGNOSIS — F1120 Opioid dependence, uncomplicated: Secondary | ICD-10-CM | POA: Diagnosis not present

## 2021-04-06 DIAGNOSIS — Z79891 Long term (current) use of opiate analgesic: Secondary | ICD-10-CM | POA: Diagnosis not present

## 2021-04-06 MED ORDER — BUPRENORPHINE HCL-NALOXONE HCL 8-2 MG SL SUBL
SUBLINGUAL_TABLET | SUBLINGUAL | 0 refills | Status: DC
  Filled 2021-04-06: qty 14, 14d supply, fill #0

## 2021-04-09 ENCOUNTER — Other Ambulatory Visit (HOSPITAL_COMMUNITY): Payer: Self-pay

## 2021-04-17 ENCOUNTER — Telehealth: Payer: Self-pay

## 2021-04-17 NOTE — Telephone Encounter (Signed)
Patient has seen [facility] previously and is also being seen by Cresenciano Lick. ABIs and arterial duplexes did not show evidence of arterial insufficiency. He has known venous insufficiency. He calls today to report his legs are very swollen R > L, painful and he is having debilitating cramps. No open wounds. Discussed with provider - we will get patient scheduled for a CT angio with run off and MD follow up. Patient verbalizes understanding.

## 2021-04-18 ENCOUNTER — Other Ambulatory Visit (HOSPITAL_COMMUNITY): Payer: Self-pay

## 2021-04-18 ENCOUNTER — Other Ambulatory Visit: Payer: Self-pay

## 2021-04-18 DIAGNOSIS — I872 Venous insufficiency (chronic) (peripheral): Secondary | ICD-10-CM

## 2021-04-19 ENCOUNTER — Other Ambulatory Visit (HOSPITAL_COMMUNITY): Payer: Self-pay

## 2021-04-20 ENCOUNTER — Other Ambulatory Visit (HOSPITAL_COMMUNITY): Payer: Self-pay

## 2021-04-23 ENCOUNTER — Other Ambulatory Visit (HOSPITAL_COMMUNITY): Payer: Self-pay

## 2021-04-23 DIAGNOSIS — M5459 Other low back pain: Secondary | ICD-10-CM | POA: Diagnosis not present

## 2021-04-23 DIAGNOSIS — G894 Chronic pain syndrome: Secondary | ICD-10-CM | POA: Diagnosis not present

## 2021-04-23 DIAGNOSIS — M79605 Pain in left leg: Secondary | ICD-10-CM | POA: Diagnosis not present

## 2021-04-23 DIAGNOSIS — F1120 Opioid dependence, uncomplicated: Secondary | ICD-10-CM | POA: Diagnosis not present

## 2021-04-23 DIAGNOSIS — Z79891 Long term (current) use of opiate analgesic: Secondary | ICD-10-CM | POA: Diagnosis not present

## 2021-04-23 MED ORDER — BUPRENORPHINE HCL-NALOXONE HCL 8-2 MG SL SUBL
SUBLINGUAL_TABLET | SUBLINGUAL | 0 refills | Status: DC
  Filled 2021-04-23 (×2): qty 28, 28d supply, fill #0

## 2021-04-24 ENCOUNTER — Other Ambulatory Visit (HOSPITAL_COMMUNITY): Payer: Self-pay

## 2021-04-25 ENCOUNTER — Other Ambulatory Visit: Payer: Self-pay

## 2021-04-25 ENCOUNTER — Ambulatory Visit (HOSPITAL_COMMUNITY)
Admission: RE | Admit: 2021-04-25 | Discharge: 2021-04-25 | Disposition: A | Discharge status: Home / Self Care | Payer: 59 | Source: Ambulatory Visit | Attending: Vascular Surgery | Admitting: Vascular Surgery

## 2021-04-25 DIAGNOSIS — N200 Calculus of kidney: Secondary | ICD-10-CM | POA: Diagnosis not present

## 2021-04-25 DIAGNOSIS — I872 Venous insufficiency (chronic) (peripheral): Secondary | ICD-10-CM | POA: Insufficient documentation

## 2021-04-25 DIAGNOSIS — I739 Peripheral vascular disease, unspecified: Secondary | ICD-10-CM | POA: Diagnosis not present

## 2021-04-25 DIAGNOSIS — M79604 Pain in right leg: Secondary | ICD-10-CM | POA: Diagnosis not present

## 2021-04-25 DIAGNOSIS — M79605 Pain in left leg: Secondary | ICD-10-CM | POA: Diagnosis not present

## 2021-04-25 MED ORDER — IOHEXOL 350 MG/ML SOLN
100.0000 mL | Freq: Once | INTRAVENOUS | Status: AC | PRN
  Administered 2021-04-25: 100 mL via INTRAVENOUS

## 2021-04-27 DIAGNOSIS — R059 Cough, unspecified: Secondary | ICD-10-CM | POA: Diagnosis not present

## 2021-04-27 DIAGNOSIS — I872 Venous insufficiency (chronic) (peripheral): Secondary | ICD-10-CM | POA: Diagnosis not present

## 2021-04-27 DIAGNOSIS — I700 Atherosclerosis of aorta: Secondary | ICD-10-CM | POA: Diagnosis not present

## 2021-04-27 DIAGNOSIS — I70203 Unspecified atherosclerosis of native arteries of extremities, bilateral legs: Secondary | ICD-10-CM | POA: Diagnosis not present

## 2021-05-01 ENCOUNTER — Other Ambulatory Visit: Payer: 59 | Admitting: Urology

## 2021-05-03 ENCOUNTER — Encounter: Payer: Self-pay | Admitting: Vascular Surgery

## 2021-05-03 ENCOUNTER — Ambulatory Visit (INDEPENDENT_AMBULATORY_CARE_PROVIDER_SITE_OTHER): Payer: 59 | Admitting: Vascular Surgery

## 2021-05-03 ENCOUNTER — Other Ambulatory Visit: Payer: Self-pay

## 2021-05-03 VITALS — BP 119/68 | HR 61 | Temp 98.1°F | Resp 20 | Ht 76.0 in | Wt 204.0 lb

## 2021-05-03 DIAGNOSIS — I872 Venous insufficiency (chronic) (peripheral): Secondary | ICD-10-CM

## 2021-05-03 NOTE — Progress Notes (Signed)
REASON FOR VISIT:   Follow-up of chronic venous insufficiency.  MEDICAL ISSUES:   LEG PAIN: The patient is continuing to have leg cramps and leg pain.  I again reassured him that I do not think his symptoms can be attributed to peripheral vascular disease.  He has palpable pedal pulses, normal triphasic Doppler signals in his feet, normal ABIs, and no evidence of significant aortoiliac occlusive disease on CT scan.  I think his symptoms could be related to some chronic venous disease.  His venous disease is followed by Dr. Aleda Grana.  He did have a venous duplex scan in April of this year which showed no evidence of DVT in either lower extremity.  I am not sure what formal venous testing he has had at Dr. Grafton Folk office.  Regardless we have again discussed the importance of leg elevation and the proper positioning for this, exercise specifically walking and water aerobics, the need to avoid prolonged sitting and standing, and continued use of compression therapy.  He does describe some paresthesias in his legs but denies any back pain.  The only other considerations to work-up his pain would be a neurology consult or possibly a rheumatology consult.  I will see him back as needed.   HPI:   Paul Sawyer is a pleasant 50 y.o. male who I have been following with chronic venous insufficiency.  I last saw him on 02/14/2021.  He had been seen by Dr. Aleda Grana and was referred with claudication.  On my history, he was complaining of some cramps in his legs but I could not get any clear-cut history of claudication, rest pain, or nonhealing ulcers.  The patient told me that he had undergone foam sclerotherapy elsewhere had developed a DVT in the left leg which was treated with apixaban which she is no longer on.  On exam he had palpable pulses bilaterally.  His noninvasive studies likewise showed triphasic signals in the feet with no evidence of significant arterial occlusive disease.  He had  an ABI of 100% bilaterally.   Since I saw him last he continues to have some cramping in his legs.  Elevation sometimes helps but sometimes does not.  He describes some paresthesias in his feet and also pain in his calves when he sleeping.  He denies any significant back pain.  Past Medical History:  Diagnosis Date   Arthritis    Clotting disorder ([place])    blood clot/dvt   History of kidney stones    PAD (peripheral artery disease) (HCC)    Varicose veins of both lower extremities     Family History  Problem Relation Age of Onset   Diabetes Father    Heart disease Father    Colon cancer Neg Hx    Esophageal cancer Neg Hx    Rectal cancer Neg Hx    Stomach cancer Neg Hx     SOCIAL HISTORY: Social History   Tobacco Use   Smoking status: Never   Smokeless tobacco: Never  Substance Use Topics   Alcohol use: Yes    Alcohol/week: 1.0 standard drink    Types: 1 Glasses of wine per week    Comment: weekly    Allergies  Allergen Reactions   Ciprofloxacin     Altered mental state   Nsaids Nausea And Vomiting    Upset stomach     Current Outpatient Medications  Medication Sig Dispense Refill   buprenorphine-naloxone (SUBOXONE) 8-2 mg SUBL SL tablet Place 1 tablet under  the tongue daily as directed 28 tablet 0   cyclobenzaprine (FLEXERIL) 10 MG tablet TAKE 1 TABLET BY MOUTH THREE TIMES DAILY AS NEEDED 60 tablet 5   fluconazole (DIFLUCAN) 150 MG tablet TAKE 1 TABLET BY MOUTH ONCE A WEEK 4 tablet 1   ketoprofen (ORUDIS) 50 MG capsule Take 1 capsule (50 mg total) by mouth 2 (two) times daily as needed. 60 capsule 1   Multiple Vitamin (MULTIVITAMIN WITH MINERALS) TABS tablet Take 1 tablet by mouth daily.     gabapentin (NEURONTIN) 300 MG capsule TAKE 1 CAPSULE BY MOUTH THREE TIMES DAILY (Patient not taking: Reported on 05/03/2021) 90 capsule 4   tamsulosin (FLOMAX) 0.4 MG CAPS capsule TAKE 1 CAPSULE (0.4 MG TOTAL) BY MOUTH DAILY. (Patient not taking: Reported on 05/03/2021) 30  capsule 11   traMADol (ULTRAM) 50 MG tablet Take 1 tablet (50 mg total) by mouth every 6 (six) hours as needed. (Patient not taking: Reported on 05/03/2021) 20 tablet 0   No current facility-administered medications for this visit.    REVIEW OF SYSTEMS:  [X]  denotes positive finding, [ ]  denotes negative finding Cardiac  Comments:  Chest pain or chest pressure: x   Shortness of breath upon exertion:    Short of breath when lying flat:    Irregular heart rhythm:        Vascular    Pain in calf, thigh, or hip brought on by ambulation: x   Pain in feet at night that wakes you up from your sleep:     Blood clot in your veins:    Leg swelling:  x       Pulmonary    Oxygen at home:    Productive cough:     Wheezing:         Neurologic    Sudden weakness in arms or legs:     Sudden numbness in arms or legs:     Sudden onset of difficulty speaking or slurred speech:    Temporary loss of vision in one eye:     Problems with dizziness:         Gastrointestinal    Blood in stool:     Vomited blood:         Genitourinary    Burning when urinating:     Blood in urine:        Psychiatric    Major depression:         Hematologic    Bleeding problems:    Problems with blood clotting too easily:        Skin    Rashes or ulcers:        Constitutional    Fever or chills:     PHYSICAL EXAM:   Vitals:   05/03/21 0945  BP: 119/68  Pulse: 61  Resp: 20  Temp: 98.1 F (36.7 C)  SpO2: 98%  Weight: 204 lb (92.5 kg)  Height: 6\' 4"  (1.93 m)    GENERAL: The patient is a well-nourished male, in no acute distress. The vital signs are documented above. CARDIAC: There is a regular rate and rhythm.  VASCULAR: I do not detect carotid bruits. He has palpable dorsalis pedis and posterior tibial pulses bilaterally. Currently has no significant swelling. PULMONARY: There is good air exchange bilaterally without wheezing or rales. MUSCULOSKELETAL: There are no major deformities or  cyanosis. NEUROLOGIC: No focal weakness or paresthesias are detected. SKIN: There are no ulcers or rashes noted. PSYCHIATRIC: The patient has a normal  affect.  DATA:    CT ABDOMEN PELVIS: I have reviewed the images of his CT abdomen pelvis.  He does have some mild calcific disease but no evidence of significant aortoiliac occlusive disease.  Likewise his infrainguinal vessels are patent down to the tibials where there is limited visualization below that.  [name] [facility] [phone]

## 2021-05-10 ENCOUNTER — Ambulatory Visit: Payer: 59 | Admitting: Vascular Surgery

## 2021-05-15 ENCOUNTER — Ambulatory Visit (INDEPENDENT_AMBULATORY_CARE_PROVIDER_SITE_OTHER): Payer: 59 | Admitting: Emergency Medicine

## 2021-05-15 ENCOUNTER — Other Ambulatory Visit: Payer: Self-pay

## 2021-05-15 ENCOUNTER — Other Ambulatory Visit (HOSPITAL_COMMUNITY): Payer: Self-pay

## 2021-05-15 ENCOUNTER — Encounter: Payer: Self-pay | Admitting: Emergency Medicine

## 2021-05-15 DIAGNOSIS — R911 Solitary pulmonary nodule: Secondary | ICD-10-CM | POA: Insufficient documentation

## 2021-05-15 NOTE — Patient Instructions (Signed)
We will plan to perform a CT scan of the chest in March 2023. Follow Dr. Lamonte Sakai in March after your CT scan so that we can review the results together. Agree with referral to cardiology to evaluate your chest discomfort.

## 2021-05-15 NOTE — Progress Notes (Signed)
Subjective:    Patient ID: Paul Sawyer, male    DOB: 02-08-1971, 50 y.o.   MRN: [id number]  HPI 50 year old never smoker with a history of DVT/PE, peripheral arterial disease and chronic venous insufficiency, renal calculi.   He reports some intermittent chest discomfort, occasional cough, non-productive. Began about few months ago. No dyspnea. He does have some B LE pain.   He underwent a CT angiogram of the abdominal aorta with runoff on 04/25/2021 that I have reviewed.  The lung windows identified a posterior right lower lobe pleural-based density 1 cm, stable going back to 07/26/2020 as well as some tiny calcified granulomas, 3 mm nodule in the right middle lobe.   Review of Systems As per HPI  Past Medical History:  Diagnosis Date   Arthritis    Clotting disorder ([place])    blood clot/dvt   History of kidney stones    PAD (peripheral artery disease) (HCC)    Varicose veins of both lower extremities      Family History  Problem Relation Age of Onset   Diabetes Father    Heart disease Father    Colon cancer Neg Hx    Esophageal cancer Neg Hx    Rectal cancer Neg Hx    Stomach cancer Neg Hx     No family hx lung CA  Social History   Socioeconomic History   Marital status: Married    Spouse name: Not on file   Number of children: Not on file   Years of education: Not on file   Highest education level: Not on file  Occupational History   Not on file  Tobacco Use   Smoking status: Never   Smokeless tobacco: Never  Vaping Use   Vaping Use: Never used  Substance and Sexual Activity   Alcohol use: Yes    Alcohol/week: 1.0 standard drink    Types: 1 Glasses of wine per week    Comment: weekly   Drug use: No   Sexual activity: Yes  Other Topics Concern   Not on file  Social History Narrative   Not on file   Social Determinants of Health   Financial Resource Strain: Not on file  Food Insecurity: Not on file  Transportation Needs: Not on file  Physical  Activity: Not on file  Stress: Not on file  Social Connections: Not on file  Intimate Partner Violence: Not on file    Works in IT, 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  No inhaled exposures.  Has lived in the [place], [place], [place].    Allergies  Allergen Reactions   Ciprofloxacin     Altered mental state   Nsaids Nausea And Vomiting    Upset stomach      Outpatient Medications Prior to Visit  Medication Sig Dispense Refill   buprenorphine-naloxone (SUBOXONE) 8-2 mg SUBL SL tablet Place 1 tablet under the tongue daily as directed 28 tablet 0   cyclobenzaprine (FLEXERIL) 10 MG tablet TAKE 1 TABLET BY MOUTH THREE TIMES DAILY AS NEEDED 60 tablet 5   fluconazole (DIFLUCAN) 150 MG tablet TAKE 1 TABLET BY MOUTH ONCE A WEEK 4 tablet 1   gabapentin (NEURONTIN) 300 MG capsule TAKE 1 CAPSULE BY MOUTH THREE TIMES DAILY 90 capsule 4   ketoprofen (ORUDIS) 50 MG capsule Take 1 capsule (50 mg total) by mouth 2 (two) times daily as needed. 60 capsule 1   Multiple Vitamin (MULTIVITAMIN WITH MINERALS) TABS tablet Take 1 tablet by mouth daily.  tamsulosin (FLOMAX) 0.4 MG CAPS capsule TAKE 1 CAPSULE (0.4 MG TOTAL) BY MOUTH DAILY. 30 capsule 11   traMADol (ULTRAM) 50 MG tablet Take 1 tablet (50 mg total) by mouth every 6 (six) hours as needed. 20 tablet 0   No facility-administered medications prior to visit.        Objective:   Physical Exam  Vitals:   05/15/21 1337  BP: 132/74  Pulse: 74  Temp: 98.2 F (36.8 C)  TempSrc: Oral  SpO2: 99%  Weight: 202 lb 12.8 oz (92 kg)  Height: 6\' 4"  (1.93 m)    Gen: Pleasant, tall, well-nourished, in no distress,  normal affect  ENT: No lesions,  mouth clear,  oropharynx clear, no postnasal drip  Neck: No JVD, no stridor  Lungs: No use of accessory muscles, no crackles or wheezing on normal respiration, no wheeze on forced expiration  Cardiovascular: RRR, heart sounds normal, no murmur or gallops, no peripheral edema  Musculoskeletal: No deformities, no cyanosis or  clubbing  Neuro: alert, awake, non focal  Skin: Warm, no lesions or rash     Assessment & Plan:   Pulmonary nodule 1 cm or greater in diameter Wedge-shaped right lower lobe peripheral pulmonary nodule that is most consistent with a pulmonary infarct and scar following remote PE.  Stability going back to 07/2020.  He has some punctate calcified granulomatous disease as well that likely benign.  We will plan to repeat his CT in March to look for interval stability.  Plan to follow for at least 2 years.   Baltazar Apo, MD, PhD 05/15/2021, 2:03 PM [facility] [phone] or if no answer before 7:00PM call [phone] For any issues after 7:00PM please call eLink [phone]

## 2021-05-15 NOTE — Assessment & Plan Note (Signed)
Wedge-shaped right lower lobe peripheral pulmonary nodule that is most consistent with a pulmonary infarct and scar following remote PE.  Stability going back to 07/2020.  He has some punctate calcified granulomatous disease as well that likely benign.  We will plan to repeat his CT in March to look for interval stability.  Plan to follow for at least 2 years.

## 2021-05-15 NOTE — Addendum Note (Signed)
Addended by: Gavin Potters R on: 05/15/2021 02:12 PM   Modules accepted: Orders

## 2021-05-16 ENCOUNTER — Emergency Department (HOSPITAL_COMMUNITY)
Admission: EM | Admit: 2021-05-16 | Discharge: 2021-05-16 | Disposition: A | Discharge status: Home / Self Care | Payer: 59 | Attending: Emergency Medicine | Admitting: Emergency Medicine

## 2021-05-16 ENCOUNTER — Encounter (HOSPITAL_COMMUNITY): Payer: Self-pay | Admitting: Emergency Medicine

## 2021-05-16 ENCOUNTER — Other Ambulatory Visit: Payer: Self-pay

## 2021-05-16 ENCOUNTER — Emergency Department (HOSPITAL_COMMUNITY): Payer: 59

## 2021-05-16 DIAGNOSIS — I700 Atherosclerosis of aorta: Secondary | ICD-10-CM | POA: Diagnosis not present

## 2021-05-16 DIAGNOSIS — J984 Other disorders of lung: Secondary | ICD-10-CM | POA: Diagnosis not present

## 2021-05-16 DIAGNOSIS — R072 Precordial pain: Secondary | ICD-10-CM | POA: Insufficient documentation

## 2021-05-16 DIAGNOSIS — M48061 Spinal stenosis, lumbar region without neurogenic claudication: Secondary | ICD-10-CM | POA: Diagnosis not present

## 2021-05-16 DIAGNOSIS — R079 Chest pain, unspecified: Secondary | ICD-10-CM

## 2021-05-16 DIAGNOSIS — R0789 Other chest pain: Secondary | ICD-10-CM | POA: Diagnosis not present

## 2021-05-16 DIAGNOSIS — R109 Unspecified abdominal pain: Secondary | ICD-10-CM | POA: Diagnosis not present

## 2021-05-16 DIAGNOSIS — M4805 Spinal stenosis, thoracolumbar region: Secondary | ICD-10-CM | POA: Diagnosis not present

## 2021-05-16 DIAGNOSIS — I2510 Atherosclerotic heart disease of native coronary artery without angina pectoris: Secondary | ICD-10-CM | POA: Diagnosis not present

## 2021-05-16 DIAGNOSIS — N200 Calculus of kidney: Secondary | ICD-10-CM | POA: Diagnosis not present

## 2021-05-16 LAB — CBC WITH DIFFERENTIAL/PLATELET
Abs Immature Granulocytes: 0.01 10*3/uL (ref 0.00–0.07)
Basophils Absolute: 0 10*3/uL (ref 0.0–0.1)
Basophils Relative: 0 %
Eosinophils Absolute: 0.1 10*3/uL (ref 0.0–0.5)
Eosinophils Relative: 1 %
HCT: 44.7 % (ref 39.0–52.0)
Hemoglobin: 15.3 g/dL (ref 13.0–17.0)
Immature Granulocytes: 0 %
Lymphocytes Relative: 35 %
Lymphs Abs: 1.7 10*3/uL (ref 0.7–4.0)
MCH: 31.3 pg (ref 26.0–34.0)
MCHC: 34.2 g/dL (ref 30.0–36.0)
MCV: 91.4 fL (ref 80.0–100.0)
Monocytes Absolute: 0.3 10*3/uL (ref 0.1–1.0)
Monocytes Relative: 6 %
Neutro Abs: 2.8 10*3/uL (ref 1.7–7.7)
Neutrophils Relative %: 58 %
Platelets: 201 10*3/uL (ref 150–400)
RBC: 4.89 MIL/uL (ref 4.22–5.81)
RDW: 12.4 % (ref 11.5–15.5)
WBC: 4.9 10*3/uL (ref 4.0–10.5)
nRBC: 0 % (ref 0.0–0.2)

## 2021-05-16 LAB — COMPREHENSIVE METABOLIC PANEL
ALT: 18 U/L (ref 0–44)
AST: 25 U/L (ref 15–41)
Albumin: 4.2 g/dL (ref 3.5–5.0)
Alkaline Phosphatase: 84 U/L (ref 38–126)
Anion gap: 8 (ref 5–15)
BUN: 9 mg/dL (ref 6–20)
CO2: 28 mmol/L (ref 22–32)
Calcium: 9.6 mg/dL (ref 8.9–10.3)
Chloride: 103 mmol/L (ref 98–111)
Creatinine, Ser: 0.95 mg/dL (ref 0.61–1.24)
GFR, Estimated: >60 mL/min (ref >60)
Glucose, Bld: 99 mg/dL (ref 70–99)
Potassium: 4.3 mmol/L (ref 3.5–5.1)
Sodium: 139 mmol/L (ref 135–145)
Total Bilirubin: 2.5 mg/dL — ABNORMAL HIGH (ref 0.3–1.2)
Total Protein: 6.8 g/dL (ref 6.5–8.1)

## 2021-05-16 LAB — TROPONIN I (HIGH SENSITIVITY)
Troponin I (High Sensitivity): 3 ng/L (ref <18)
Troponin I (High Sensitivity): 3 ng/L (ref <18)

## 2021-05-16 LAB — LIPASE, BLOOD: Lipase: 24 U/L (ref 11–51)

## 2021-05-16 MED ORDER — IOHEXOL 350 MG/ML SOLN
100.0000 mL | Freq: Once | INTRAVENOUS | Status: AC | PRN
  Administered 2021-05-16: 100 mL via INTRAVENOUS

## 2021-05-16 NOTE — Discharge Instructions (Signed)
Return for any problem.  ?

## 2021-05-16 NOTE — ED Provider Notes (Signed)
Emergency Medicine Provider Triage Evaluation Note  Paul Sawyer , a 50 y.o. male  was evaluated in triage.  Pt complains of mid chest pain radiating into his back.  He states that this started yesterday while he was on the treadmill however he has been intermittently having similar pains for the past 2 months but have been worsening.  He reports shortness of breath.  The pain radiates from his chest into his back.  He denies any numbness or weakness.  Review of Systems  Positive: Chest pain, back pain Negative: Syncope  Physical Exam  BP 138/84 (BP Location: Right Arm)   Pulse 71   Temp 97.8 F (36.6 C)   Resp 18   SpO2 100%  Gen:   Awake, patient is very tall, possible marfanoid habitus Resp:  Normal effort  MSK:   Moves extremities without difficulty  Other:  Patient is awake and alert.  Speech is not slurred.  Answers questions appropriately without difficulty  Medical Decision Making  Medically screening exam initiated at 4:07 PM.  Appropriate orders placed.  Chawn Spraggins Lua was informed that the remainder of the evaluation will be completed by another provider, this initial triage assessment does not replace that evaluation, and the importance of remaining in the ED until their evaluation is complete.  Chart review does not show the patient has had a chest x-ray or other chest imaging recently.  He has a possible marfanoid habitus on exam.  Given the pain rotating and there was back ordered cardiac labs, and CTA dissection study.   Lorin Glass, PA-C 05/16/21 1609    Valarie Merino, MD 05/16/21 [phone]

## 2021-05-16 NOTE — ED Triage Notes (Signed)
Pt c/o intermittent chest pain x 2 months, but has been worsening. States yesterday while he was working out, he began having chest pain that radiated to his right shoulder and back, along with shortness of breath.

## 2021-05-16 NOTE — ED Provider Notes (Signed)
[facility] DEPART[facility] Provider Note   CSN: [id number] Arrival date & time: 05/16/21  1527     History Chief Complaint  Patient presents with   Chest Pain    Paul Sawyer is a 50 y.o. male.  50 year old male with prior medical history as detailed below presents for evaluation.  Patient complains of intermittent mid chest discomfort.  This is been ongoing for at least 2 months.  Patient reports that his symptoms have been somewhat more persistent over the last 2 weeks.  He is currently without active chest pain.  Patient's pain is dull in nature.  Patient reports that the pain occasionally will radiate to his mid back.  He denies any specific inciting event going back several weeks.  He feels that there may be a slight association with exercise over the last week or 2.  He denies fever.  Denies cough.  No shortness of breath.  He appears to be comfortable and in no distress at the time of my evaluation.  Patient reports that he has a pending appointment with cardiology in [place].  He made this appointment yesterday.  He decided today to come into the ED for evaluation.  He denies prior known cardiac disease.  He denies prior cardiac work-up.  The history is provided by the patient.  Chest Pain Pain location:  Substernal area Pain quality: dull   Pain radiates to:  Mid back Pain severity:  Mild Onset quality:  Gradual Duration:  2 months Timing:  Constant Progression:  Waxing and waning Chronicity:  Recurrent Relieved by:  Nothing Worsened by:  Exertion     Past Medical History:  Diagnosis Date   Arthritis    Clotting disorder (HCC)    blood clot/dvt   History of kidney stones    PAD (peripheral artery disease) ([place])    Varicose veins of both lower extremities     Patient Active Problem List   Diagnosis Date Noted   Pulmonary nodule 1 cm or greater in diameter 05/15/2021   Kidney stones 10/18/2020   Benign prostatic hyperplasia with  urinary obstruction 10/18/2020   Nocturia 10/18/2020   Long-term current use of opiate analgesic 12/27/2019   Low back pain 12/27/2019   Opioid dependence ([place]) 12/27/2019   Thoracic outlet syndrome 03/19/2019   PAD (peripheral artery disease) ([place]) 03/05/2019   Varicose veins of both lower extremities with pain 08/22/2017   Chronic venous insufficiency 08/22/2017   DDD (degenerative disc disease), lumbosacral 07/07/2007   Pain in joint involving lower leg 07/07/2007    Past Surgical History:  Procedure Laterality Date   CHOLECYSTECTOMY  2004   EXTRACORPOREAL SHOCK WAVE LITHOTRIPSY Left 08/08/2020   Procedure: EXTRACORPOREAL SHOCK WAVE LITHOTRIPSY (ESWL);  Surgeon: Cleon Gustin, MD;  Location: [facility];  Service: Urology;  Laterality: Left;   GASTRIC BYPASS  2005       Family History  Problem Relation Age of Onset   Diabetes Father    Heart disease Father    Colon cancer Neg Hx    Esophageal cancer Neg Hx    Rectal cancer Neg Hx    Stomach cancer Neg Hx     Social History   Tobacco Use   Smoking status: Never   Smokeless tobacco: Never  Vaping Use   Vaping Use: Never used  Substance Use Topics   Alcohol use: Yes    Alcohol/week: 1.0 standard drink    Types: 1 Glasses of wine per week  Comment: weekly   Drug use: No    Home Medications Prior to Admission medications   Medication Sig Start Date End Date Taking? Authorizing Provider  buprenorphine-naloxone (SUBOXONE) 8-2 mg SUBL SL tablet Place 1 tablet under the tongue daily as directed 04/23/21     cyclobenzaprine (FLEXERIL) 10 MG tablet TAKE 1 TABLET BY MOUTH THREE TIMES DAILY AS NEEDED 12/29/20     fluconazole (DIFLUCAN) 150 MG tablet TAKE 1 TABLET BY MOUTH ONCE A WEEK 10/26/20 10/26/21  Evelina Bucy, DPM  gabapentin (NEURONTIN) 300 MG capsule TAKE 1 CAPSULE BY MOUTH THREE TIMES DAILY 06/28/20 06/28/21  Phillips Odor, MD  ketoprofen (ORUDIS) 50 MG capsule Take 1 capsule (50 mg total) by mouth 2 (two)  times daily as needed. 04/04/21   Linus Mako, MD  Multiple Vitamin (MULTIVITAMIN WITH MINERALS) TABS tablet Take 1 tablet by mouth daily.    [provider]  tamsulosin (FLOMAX) 0.4 MG CAPS capsule TAKE 1 CAPSULE (0.4 MG TOTAL) BY MOUTH DAILY. 10/18/20 10/18/21  McKenzie, Candee Furbish, MD  traMADol (ULTRAM) 50 MG tablet Take 1 tablet (50 mg total) by mouth every 6 (six) hours as needed. 08/08/20 08/08/21  Cleon Gustin, MD    Allergies    Ciprofloxacin and Nsaids  Review of Systems   Review of Systems  Cardiovascular:  Positive for chest pain.  All other systems reviewed and are negative.  Physical Exam Updated Vital Signs BP 132/79 (BP Location: Left Arm)   Pulse 60   Temp 97.8 F (36.6 C)   Resp 18   SpO2 95%   Physical Exam Vitals and nursing note reviewed.  Constitutional:      General: He is not in acute distress.    Appearance: Normal appearance. He is well-developed.  HENT:     Head: Normocephalic and atraumatic.  Eyes:     Conjunctiva/sclera: Conjunctivae normal.     Pupils: Pupils are equal, round, and reactive to light.  Cardiovascular:     Rate and Rhythm: Normal rate and regular rhythm.     Heart sounds: Normal heart sounds.  Pulmonary:     Effort: Pulmonary effort is normal. No respiratory distress.     Breath sounds: Normal breath sounds.  Abdominal:     General: There is no distension.     Palpations: Abdomen is soft.     Tenderness: There is no abdominal tenderness.  Musculoskeletal:        General: No deformity. Normal range of motion.     Cervical back: Normal range of motion and neck supple.  Skin:    General: Skin is warm and dry.  Neurological:     General: No focal deficit present.     Mental Status: He is alert and oriented to person, place, and time.    ED Results / Procedures / Treatments   Labs (all labs ordered are listed, but only abnormal results are displayed) Labs Reviewed  COMPREHENSIVE METABOLIC PANEL - Abnormal;  Notable for the following components:      Result Value   Total Bilirubin 2.5 (*)    All other components within normal limits  LIPASE, BLOOD  CBC WITH DIFFERENTIAL/PLATELET  TROPONIN I (HIGH SENSITIVITY)  TROPONIN I (HIGH SENSITIVITY)    EKG EKG Interpretation  Date/Time:  Wednesday May 16 2021 15:36:02 EDT Ventricular Rate:  67 PR Interval:  132 QRS Duration: 82 QT Interval:  388 QTC Calculation: 409 R Axis:   89 Text Interpretation: Normal sinus rhythm Right atrial enlargement Borderline  ECG Confirmed by Dene Gentry [phone]) on 05/16/2021 6:45:10 PM  Radiology DG Chest 2 View  Result Date: 05/16/2021 CLINICAL DATA:  Chest pain. EXAM: CHEST - 2 VIEW COMPARISON:  None. FINDINGS: There is some linear scarring or atelectasis in the right mid lung. There is no focal lung infiltrate, pleural effusion or pneumothorax. There is scarring in both lung apices. The cardiomediastinal silhouette is within normal limits. There are healed bilateral lower rib fractures. No acute fractures are seen. Surgical clips are noted in the upper abdomen. IMPRESSION: No active cardiopulmonary disease. Electronically Signed   By: Ronney Asters M.D.   On: 05/16/2021 16:25   CT Angio Chest/Abd/Pel for Dissection W and/or Wo Contrast  Result Date: 05/16/2021 CLINICAL DATA:  Pt c/o intermittent chest pain x 2 months, but has been worsening. States yesterday while he was working out, he began having chest pain that radiated to his right shoulder and back, along with shortness of breath. Chest pain or back pain, aortic dissection suspected EXAM: CT ANGIOGRAPHY CHEST, ABDOMEN AND PELVIS TECHNIQUE: Non-contrast CT of the chest was initially obtained. Multidetector CT imaging through the chest, abdomen and pelvis was performed using the standard protocol during bolus administration of intravenous contrast. Multiplanar reconstructed images and MIPs were obtained and reviewed to evaluate the vascular anatomy. CONTRAST:   128mL OMNIPAQUE IOHEXOL 350 MG/ML SOLN COMPARISON:  None. FINDINGS: CTA CHEST FINDINGS Cardiovascular: Preferential opacification of the thoracic aorta. No evidence of thoracic aortic aneurysm or dissection. Four-vessel coronary calcifications. Normal heart size. No pericardial effusion. Mediastinum/Nodes: No enlarged mediastinal, hilar, or axillary lymph nodes. Thyroid gland, trachea, and esophagus demonstrate no significant findings. Lungs/Pleura: Biapical pleural/pulmonary scarring. Few scattered pulmonary micronodules. No focal consolidation. No pulmonary nodule. No pulmonary mass. No pleural effusion. No pneumothorax. Musculoskeletal: No chest wall abnormality. No suspicious lytic or blastic osseous lesions. No acute displaced fracture. Multilevel degenerative changes of the spine with multilevel osseous neural foraminal stenosis. Old healed rib fractures. Review of the MIP images confirms the above findings. CTA ABDOMEN AND PELVIS FINDINGS VASCULAR Aorta: Normal caliber aorta without aneurysm, dissection, vasculitis or significant stenosis. Celiac: Patent without evidence of aneurysm, dissection, vasculitis or significant stenosis. SMA: Patent without evidence of aneurysm, dissection, vasculitis or significant stenosis. Renals: Both renal arteries are patent without evidence of aneurysm, dissection, vasculitis, fibromuscular dysplasia or significant stenosis. IMA: Patent without evidence of aneurysm, dissection, vasculitis or significant stenosis. Inflow: At least mild atherosclerotic plaque. Patent without evidence of aneurysm, dissection, vasculitis or significant stenosis. Veins: No obvious venous abnormality within the limitations of this arterial phase study. Review of the MIP images confirms the above findings. NON-VASCULAR Hepatobiliary: No focal liver abnormality. Status post cholecystectomy. No biliary dilatation. Pancreas: No focal lesion. Normal pancreatic contour. No surrounding inflammatory  changes. No main pancreatic ductal dilatation. Spleen: Normal in size without focal abnormality. Adrenals/Urinary Tract: No adrenal nodule bilaterally. Bilateral kidneys enhance symmetrically. Subcentimeter hypodensity along the left renal superior pole too small to characterize. Multiple calcified stones measuring 2-3 mm are noted within the left kidney. No hydronephrosis. No hydroureter. The urinary bladder is unremarkable. Stomach/Bowel: Surgical changes related to Roux-en-Y gastric bypass. Stomach is within normal limits. No evidence of bowel wall thickening or dilatation. Stool throughout the colon. Appendix appears normal. Lymphatic: No lymphadenopathy. Reproductive: Prostate is unremarkable. Other: No intraperitoneal free fluid. No intraperitoneal free gas. No organized fluid collection. Musculoskeletal: No abdominal wall hernia or abnormality. No suspicious lytic or blastic osseous lesions. No acute displaced fracture. Multilevel degenerative changes of the spine.  At least mild osseous neural foraminal stenosis at the L4-L5 levels. Review of the MIP images confirms the above findings. IMPRESSION: 1. No acute vascular abnormality. 2. Aortic Atherosclerosis (ICD10-I70.0) including four-vessel coronary calcifications. 3. Several nonobstructive 2-3 mm left nephrolithiasis. 4. Constipation. 5. Multilevel degenerative changes with associated osseous neural foraminal stenosis of the thoracolumbar spine. Electronically Signed   By: Iven Finn M.D.   On: 05/16/2021 18:49    Procedures Procedures   Medications Ordered in ED Medications  iohexol (OMNIPAQUE) 350 MG/ML injection 100 mL (100 mLs Intravenous Contrast Given 05/16/21 1801)    ED Course  I have reviewed the triage vital signs and the nursing notes.  Pertinent labs & imaging results that were available during my care of the patient were reviewed by me and considered in my medical decision making (see chart for details).    MDM  Rules/Calculators/A&P                           MDM  MSE complete  Paul Sawyer was evaluated in Emergency Department on 05/16/2021 for the symptoms described in the history of present illness. He was evaluated in the context of the global COVID-19 pandemic, which necessitated consideration that the patient might be at risk for infection with the SARS-CoV-2 virus that causes COVID-19. Institutional protocols and algorithms that pertain to the evaluation of patients at risk for COVID-19 are in a state of rapid change based on information released by regulatory bodies including the CDC and federal and state organizations. These policies and algorithms were followed during the patient's care in the ED.   Presented with complaint of chest discomfort.  Patient's describe symptoms are somewhat atypical of ACS.  Patient without prior history of known CAD.  Patient with prior history of PAD.  EKG is without evidence of acute ischemia.  Troponin x2 is nearly undetectable.  Other labs obtained and CT imaging is without evidence of significant acute pathology.  Patient feels reassured after his ED evaluation.  He does understand need for close outpatient follow-up with cardiology.  He reports that he has an appointment with a cardiologist in [place].  Referral made for [facility] cardiology as well.  Strict return precautions given and understood.  Patient is well aware that if he has significant chest pain or discomfort he should return immediately to the ED and/or call 911.   Final Clinical Impression(s) / ED Diagnoses Final diagnoses:  Chest pain, unspecified type    Rx / DC Orders ED Discharge Orders          Ordered    Ambulatory referral to Cardiology       Comments: Chest pain - outpatient FU after ED evaluation   05/16/21 2110             Valarie Merino, MD 05/16/21 2112

## 2021-05-17 ENCOUNTER — Telehealth: Payer: Self-pay | Admitting: Gastroenterology

## 2021-05-17 ENCOUNTER — Other Ambulatory Visit: Payer: Self-pay

## 2021-05-17 DIAGNOSIS — R0789 Other chest pain: Secondary | ICD-10-CM

## 2021-05-17 DIAGNOSIS — Z9884 Bariatric surgery status: Secondary | ICD-10-CM

## 2021-05-17 NOTE — Telephone Encounter (Signed)
Patient started having chest discomfort "behind my breast bone. It kind of aches. It can be a 5." He has been checked by pulmonology and has been to the ED. Both of these visit turned up nothing to explain his symptoms. He tells me it is hard to describe. He does not burp "due to my gastric bypass." He is careful with his diet and has not noticed any food triggers. Reports he "swallowed hard last night because I was in a hurry. That created the pain." Home treatments have consisted of OTC TUMS. He does not notice much improvement with this. Feels his symptoms are getting worse.He is scheduled in the first available opening which is 06/12/21.

## 2021-05-17 NOTE — Telephone Encounter (Signed)
Please advise patient to follow up with cardiology to exclude angina. He does have atherosclerosis and vascular disease. Will need to exclude cardiac etiology of chest pain.  Reviewed CTA that was done in ER.  Schedule barium upper GI series, please add him to cancellation list and will try to bring him in sooner with any available provider.

## 2021-05-17 NOTE — Telephone Encounter (Signed)
Patient advised. He agrees to this plan of care. He has already scheduled an appointment with cardiology. He will see Dr Radford Pax on 05/31/21.

## 2021-05-21 ENCOUNTER — Other Ambulatory Visit (HOSPITAL_COMMUNITY): Payer: Self-pay

## 2021-05-21 ENCOUNTER — Encounter: Payer: Self-pay | Admitting: Cardiology

## 2021-05-21 ENCOUNTER — Other Ambulatory Visit: Payer: Self-pay

## 2021-05-21 ENCOUNTER — Ambulatory Visit (INDEPENDENT_AMBULATORY_CARE_PROVIDER_SITE_OTHER): Payer: 59 | Admitting: Cardiology

## 2021-05-21 VITALS — BP 110/62 | HR 70 | Ht 76.0 in | Wt 206.0 lb

## 2021-05-21 DIAGNOSIS — R079 Chest pain, unspecified: Secondary | ICD-10-CM | POA: Diagnosis not present

## 2021-05-21 DIAGNOSIS — R600 Localized edema: Secondary | ICD-10-CM

## 2021-05-21 MED ORDER — METOPROLOL TARTRATE 100 MG PO TABS
ORAL_TABLET | ORAL | 0 refills | Status: DC
  Filled 2021-05-21: qty 1, 1d supply, fill #0

## 2021-05-21 NOTE — Progress Notes (Signed)
Cardiology Office Note:    Date:  05/21/2021   ID:  Paul Sawyer, DOB 05/05/1971, MRN [id number]  PCP:  Rosita Fire, MD  Cardiologist:  None  Electrophysiologist:  None   Referring MD: Rosita Fire, MD   Chief Complaint  Patient presents with   Chest Pain     History of Present Illness:    Paul Sawyer is a 50 y.o. male with a hx of DVT, gastric bypass who is referred by Dr. Legrand Rams for evaluation of chest pain.  He reports that he had a DVT 7 years ago.  Was thought to be complication from treatment for venous insufficiency.  Took anticoagulation for 6 months.  Reports he has been having chest pain for the last 2 months.  Describes center to right-sided chest pain.  Has been occurring daily.  Given worsening symptoms, presented to ED 10/5.  Work-up was unremarkable, including troponins negative x2.  CTA chest showed no evidence of dissection but was noted to have coronary calcifications.  He denies any dyspnea.  Reports occasional lightheadedness but denies any syncope.  Reports chronic lower extremity edema.  Denies any palpitations.  No smoking history.  Family history includes Father had CABG in 4s.   Past Medical History:  Diagnosis Date   Arthritis    Clotting disorder ([place])    blood clot/dvt   History of kidney stones    PAD (peripheral artery disease) ([place])    Varicose veins of both lower extremities     Past Surgical History:  Procedure Laterality Date   CHOLECYSTECTOMY  2004   EXTRACORPOREAL SHOCK WAVE LITHOTRIPSY Left 08/08/2020   Procedure: EXTRACORPOREAL SHOCK WAVE LITHOTRIPSY (ESWL);  Surgeon: Cleon Gustin, MD;  Location: [facility];  Service: Urology;  Laterality: Left;   GASTRIC BYPASS  2005    Current Medications: Current Meds  Medication Sig   buprenorphine-naloxone (SUBOXONE) 8-2 mg SUBL SL tablet Place 1 tablet under the tongue daily as directed   metoprolol tartrate (LOPRESSOR) 100 MG tablet Take 1 tablet (100 mg) by mouth 2 hours  before procedure   Multiple Vitamin (MULTIVITAMIN WITH MINERALS) TABS tablet Take 1 tablet by mouth daily.     Allergies:   Ciprofloxacin and Nsaids   Social History   Socioeconomic History   Marital status: Married    Spouse name: Not on file   Number of children: Not on file   Years of education: Not on file   Highest education level: Not on file  Occupational History   Not on file  Tobacco Use   Smoking status: Never   Smokeless tobacco: Never  Vaping Use   Vaping Use: Never used  Substance and Sexual Activity   Alcohol use: Yes    Alcohol/week: 1.0 standard drink    Types: 1 Glasses of wine per week    Comment: weekly   Drug use: No   Sexual activity: Yes  Other Topics Concern   Not on file  Social History Narrative   Not on file   Social Determinants of Health   Financial Resource Strain: Not on file  Food Insecurity: Not on file  Transportation Needs: Not on file  Physical Activity: Not on file  Stress: Not on file  Social Connections: Not on file     Family History: The patient's family history includes Diabetes in his father; Heart disease in his father. There is no history of Colon cancer, Esophageal cancer, Rectal cancer, or Stomach cancer.  ROS:   Please see  the history of present illness.     All other systems reviewed and are negative.  EKGs/Labs/Other Studies Reviewed:    The following studies were reviewed today:   EKG:  EKG is not ordered today.  The ekg ordered 05/16/2021 shows normal sinus rhythm, rate 67, no ST abnormalities.  Recent Labs: 05/16/2021: ALT 18; BUN 9; Creatinine, Ser 0.95; Hemoglobin 15.3; Platelets 201; Potassium 4.3; Sodium 139  Recent Lipid Panel No results found for: CHOL, TRIG, HDL, CHOLHDL, VLDL, LDLCALC, LDLDIRECT  Physical Exam:    VS:  BP 110/62 (BP Location: Left Arm)   Pulse 70   Ht 6\' 4"  (1.93 m)   Wt 206 lb (93.4 kg)   SpO2 97%   BMI 25.08 kg/m     Wt Readings from Last 3 Encounters:  05/21/21 206 lb  (93.4 kg)  05/16/21 203 lb (92.1 kg)  05/15/21 202 lb 12.8 oz (92 kg)     GEN:  Well nourished, well developed in no acute distress HEENT: Normal NECK: No JVD; No carotid bruits LYMPHATICS: No lymphadenopathy CARDIAC: RRR, no murmurs, rubs, gallops RESPIRATORY:  Clear to auscultation without rales, wheezing or rhonchi  ABDOMEN: Soft, non-tender, non-distended MUSCULOSKELETAL:  No edema; No deformity  SKIN: Warm and dry NEUROLOGIC:  Alert and oriented x 3 PSYCHIATRIC:  Normal affect   ASSESSMENT:    1. Chest pain, unspecified type   2. Lower extremity edema    PLAN:    Chest pain: Atypical description but does have CAD risk factors (age, family history) and noted to have coronary calcifications on recent CT chest. -Recommend coronary CTA to evaluate for obstructive CAD.  Will give Lopressor 100 mg prior to study  Chronic lower extremity edema: Thought to be secondary to venous insufficiency.  Will check echocardiogram to rule out structural heart disease   Medication Adjustments/Labs and Tests Ordered: Current medicines are reviewed at length with the patient today.  Concerns regarding medicines are outlined above.  Orders Placed This Encounter  Procedures   CT CORONARY MORPH W/CTA COR W/SCORE W/CA W/CM &/OR WO/CM   ECHOCARDIOGRAM COMPLETE   Meds ordered this encounter  Medications   metoprolol tartrate (LOPRESSOR) 100 MG tablet    Sig: Take 1 tablet (100 mg) by mouth 2 hours before procedure    Dispense:  1 tablet    Refill:  0    Patient Instructions  Medication Instructions:   Your physician recommends that you continue on your current medications as directed. Please refer to the Current Medication list given to you today.    Take Lopressor 100 mg 2 hours before CT  *If you need a refill on your cardiac medications before your next appointment, please call your pharmacy*   Lab Work:  None today  If you have labs (blood work) drawn today and your tests  are completely normal, you will receive your results only by: [place] (if you have MyChart) OR A paper copy in the mail If you have any lab test that is abnormal or we need to change your treatment, we will call you to review the results.   Testing/Procedures: Your physician has requested that you have an echocardiogram. Echocardiography is a painless test that uses sound waves to create images of your heart. It provides your doctor with information about the size and shape of your heart and how well your heart's chambers and valves are working. This procedure takes approximately one hour. There are no restrictions for this procedure.    Your  cardiac CT will be scheduled at one of the below locations:   [facility] [address] [place], [place] [address] (336) [place] [address] [place], [place] [address] [phone]  If scheduled at [facility], please arrive at the [facility] main entrance (entrance A) of [facility] 30 minutes prior to test start time. Proceed to the [facility] Radiology Department (first floor) to check-in and test prep.  If scheduled at [facility], please arrive 15 mins early for check-in and test prep.  Please follow these instructions carefully (unless otherwise directed):  Hold all erectile dysfunction medications at least 3 days (72 hrs) prior to test.  On the Night Before the Test: Be sure to Drink plenty of water. Do not consume any caffeinated/decaffeinated beverages or chocolate 12 hours prior to your test. Do not take any antihistamines 12 hours prior to your test.  On the Day of the Test: Drink plenty of water until 1 hour prior to the test. Do not eat any food 4 hours prior to the test. You may take your regular medications prior to the test.  Take metoprolol (Lopressor) two hours prior to test.          After the Test: Drink plenty of water. After receiving IV contrast, you may experience a mild flushed feeling. This is normal. On occasion, you may experience a mild rash up to 24 hours after the test. This is not dangerous. If this occurs, you can take Benadryl 25 mg and increase your fluid intake. If you experience trouble breathing, this can be serious. If it is severe call 911 IMMEDIATELY. If it is mild, please call our office. If you take any of these medications: Glipizide/Metformin, Avandament, Glucavance, please do not take 48 hours after completing test unless otherwise instructed.  Please allow 2-4 weeks for scheduling of routine cardiac CTs. Some insurance companies require a pre-authorization which may delay scheduling of this test.   For non-scheduling related questions, please contact the cardiac imaging nurse navigator should you have any questions/concerns: Marchia Bond, Cardiac Imaging Nurse Navigator Gordy Clement, Cardiac Imaging Nurse Navigator [facility] Direct Office Dial: [phone]   For scheduling needs, including cancellations and rescheduling, please call [place], [phone].    Follow-Up: At [facility], you and your health needs are our priority.  As part of our continuing mission to provide you with exceptional heart care, we have created designated Provider Care Teams.  These Care Teams include your primary Cardiologist (physician) and Advanced Practice Providers (APPs -  Physician Assistants and Nurse Practitioners) who all work together to provide you with the care you need, when you need it.  We recommend signing up for the patient portal called "MyChart".  Sign up information is provided on this After Visit Summary.  MyChart is used to connect with patients for Virtual Visits (Telemedicine).  Patients are able to view lab/test results, encounter notes, upcoming appointments, etc.  Non-urgent messages can be sent to your  provider as well.   To learn more about what you can do with MyChart, go to [email or web address].    Your next appointment:   6 month(s)  The format for your next appointment:   In Person  Provider:   Dr. Gardiner Rhyme   Other Instructions None    Signed, Donato Heinz, MD  05/21/2021 10:57 PM    [place]

## 2021-05-21 NOTE — Patient Instructions (Signed)
Medication Instructions:   Your physician recommends that you continue on your current medications as directed. Please refer to the Current Medication list given to you today.    Take Lopressor 100 mg 2 hours before CT  *If you need a refill on your cardiac medications before your next appointment, please call your pharmacy*   Lab Work:  None today  If you have labs (blood work) drawn today and your tests are completely normal, you will receive your results only by: [place] (if you have MyChart) OR A paper copy in the mail If you have any lab test that is abnormal or we need to change your treatment, we will call you to review the results.   Testing/Procedures: Your physician has requested that you have an echocardiogram. Echocardiography is a painless test that uses sound waves to create images of your heart. It provides your doctor with information about the size and shape of your heart and how well your heart's chambers and valves are working. This procedure takes approximately one hour. There are no restrictions for this procedure.    Your cardiac CT will be scheduled at one of the below locations:   [facility] [address] [place], [place] [address] [phone]  [place] [address] [place], [place] [address] [phone]  If scheduled at [facility], please arrive at the [facility] main entrance (entrance A) of [facility] 30 minutes prior to test start time. Proceed to the [facility] Radiology Department (first floor) to check-in and test prep.  If scheduled at [facility], please arrive 15 mins early for check-in and test prep.  Please follow these instructions carefully (unless otherwise directed):  Hold all erectile dysfunction medications at least 3 days (72 hrs) prior to test.  On the Night Before the Test: Be sure to Drink plenty of  water. Do not consume any caffeinated/decaffeinated beverages or chocolate 12 hours prior to your test. Do not take any antihistamines 12 hours prior to your test.  On the Day of the Test: Drink plenty of water until 1 hour prior to the test. Do not eat any food 4 hours prior to the test. You may take your regular medications prior to the test.  Take metoprolol (Lopressor) two hours prior to test.         After the Test: Drink plenty of water. After receiving IV contrast, you may experience a mild flushed feeling. This is normal. On occasion, you may experience a mild rash up to 24 hours after the test. This is not dangerous. If this occurs, you can take Benadryl 25 mg and increase your fluid intake. If you experience trouble breathing, this can be serious. If it is severe call 911 IMMEDIATELY. If it is mild, please call our office. If you take any of these medications: Glipizide/Metformin, Avandament, Glucavance, please do not take 48 hours after completing test unless otherwise instructed.  Please allow 2-4 weeks for scheduling of routine cardiac CTs. Some insurance companies require a pre-authorization which may delay scheduling of this test.   For non-scheduling related questions, please contact the cardiac imaging nurse navigator should you have any questions/concerns: Marchia Bond, Cardiac Imaging Nurse Navigator Gordy Clement, Cardiac Imaging Nurse Navigator [facility] [facility] Direct Office Dial: [phone]   For scheduling needs, including cancellations and rescheduling, please call [place], [phone].    Follow-Up: At [facility], you and your  health needs are our priority.  As part of our continuing mission to provide you with exceptional heart care, we have created designated Provider Care Teams.  These Care Teams include your primary Cardiologist (physician) and Advanced Practice Providers (APPs -  Physician Assistants and Nurse  Practitioners) who all work together to provide you with the care you need, when you need it.  We recommend signing up for the patient portal called "MyChart".  Sign up information is provided on this After Visit Summary.  MyChart is used to connect with patients for Virtual Visits (Telemedicine).  Patients are able to view lab/test results, encounter notes, upcoming appointments, etc.  Non-urgent messages can be sent to your provider as well.   To learn more about what you can do with MyChart, go to [email or web address].    Your next appointment:   6 month(s)  The format for your next appointment:   In Person  Provider:   Dr. Gardiner Rhyme   Other Instructions None

## 2021-05-23 ENCOUNTER — Other Ambulatory Visit (HOSPITAL_COMMUNITY): Payer: Self-pay

## 2021-05-23 DIAGNOSIS — H5213 Myopia, bilateral: Secondary | ICD-10-CM | POA: Diagnosis not present

## 2021-05-23 DIAGNOSIS — Z79891 Long term (current) use of opiate analgesic: Secondary | ICD-10-CM | POA: Diagnosis not present

## 2021-05-23 DIAGNOSIS — M5459 Other low back pain: Secondary | ICD-10-CM | POA: Diagnosis not present

## 2021-05-23 DIAGNOSIS — G894 Chronic pain syndrome: Secondary | ICD-10-CM | POA: Diagnosis not present

## 2021-05-23 DIAGNOSIS — F1120 Opioid dependence, uncomplicated: Secondary | ICD-10-CM | POA: Diagnosis not present

## 2021-05-23 DIAGNOSIS — M79605 Pain in left leg: Secondary | ICD-10-CM | POA: Diagnosis not present

## 2021-05-23 MED ORDER — BUPRENORPHINE HCL-NALOXONE HCL 8-2 MG SL SUBL
SUBLINGUAL_TABLET | SUBLINGUAL | 0 refills | Status: DC
  Filled 2021-05-23: qty 28, 28d supply, fill #0

## 2021-05-24 ENCOUNTER — Other Ambulatory Visit (HOSPITAL_COMMUNITY): Payer: Self-pay

## 2021-05-25 ENCOUNTER — Other Ambulatory Visit (HOSPITAL_COMMUNITY): Payer: Self-pay

## 2021-05-30 ENCOUNTER — Telehealth (HOSPITAL_COMMUNITY): Payer: Self-pay | Admitting: Emergency Medicine

## 2021-05-30 NOTE — Telephone Encounter (Signed)
Reaching out to patient to offer assistance regarding upcoming cardiac imaging study; pt verbalizes understanding of appt date/time, parking situation and where to check in, pre-test NPO status and medications ordered, and verified current allergies; name and call back number provided for further questions should they arise Marchia Bond RN Navigator Cardiac Imaging [facility] [phone] office [phone] cell  100mg  metoprolol tart  Denies iv issues  Denies claustro

## 2021-05-31 ENCOUNTER — Ambulatory Visit: Payer: 59 | Admitting: Cardiology

## 2021-05-31 ENCOUNTER — Other Ambulatory Visit: Payer: Self-pay

## 2021-05-31 ENCOUNTER — Ambulatory Visit (HOSPITAL_COMMUNITY)
Admission: RE | Admit: 2021-05-31 | Discharge: 2021-05-31 | Disposition: A | Discharge status: Home / Self Care | Payer: 59 | Source: Ambulatory Visit | Attending: Cardiology | Admitting: Cardiology

## 2021-05-31 ENCOUNTER — Inpatient Hospital Stay (HOSPITAL_COMMUNITY): Admission: RE | Admit: 2021-05-31 | Payer: 59 | Source: Ambulatory Visit

## 2021-05-31 DIAGNOSIS — R079 Chest pain, unspecified: Secondary | ICD-10-CM | POA: Insufficient documentation

## 2021-05-31 MED ORDER — IOHEXOL 350 MG/ML SOLN
95.0000 mL | Freq: Once | INTRAVENOUS | Status: AC | PRN
  Administered 2021-05-31: 95 mL via INTRAVENOUS

## 2021-05-31 MED ORDER — DILTIAZEM HCL 25 MG/5ML IV SOLN
INTRAVENOUS | Status: AC
  Administered 2021-05-31: 10 mg via INTRAVENOUS
  Filled 2021-05-31: qty 5

## 2021-05-31 MED ORDER — NITROGLYCERIN 0.4 MG SL SUBL
SUBLINGUAL_TABLET | SUBLINGUAL | Status: AC
  Filled 2021-05-31: qty 2

## 2021-05-31 MED ORDER — METOPROLOL TARTRATE 5 MG/5ML IV SOLN: 10.0000 mg | INTRAVENOUS | Status: DC | PRN

## 2021-05-31 MED ORDER — DILTIAZEM HCL 25 MG/5ML IV SOLN: 10.0000 mg | INTRAVENOUS | Status: DC | PRN

## 2021-05-31 MED ORDER — METOPROLOL TARTRATE 5 MG/5ML IV SOLN
INTRAVENOUS | Status: AC
  Administered 2021-05-31: 10 mg via INTRAVENOUS
  Filled 2021-05-31: qty 20

## 2021-05-31 MED ORDER — NITROGLYCERIN 0.4 MG SL SUBL
0.8000 mg | SUBLINGUAL_TABLET | Freq: Once | SUBLINGUAL | Status: AC
  Administered 2021-05-31: 0.8 mg via SUBLINGUAL

## 2021-06-04 ENCOUNTER — Other Ambulatory Visit: Payer: Self-pay | Admitting: *Deleted

## 2021-06-04 DIAGNOSIS — Z13220 Encounter for screening for lipoid disorders: Secondary | ICD-10-CM

## 2021-06-04 DIAGNOSIS — I2510 Atherosclerotic heart disease of native coronary artery without angina pectoris: Secondary | ICD-10-CM

## 2021-06-04 MED ORDER — ASPIRIN EC 81 MG PO TBEC: 81.0000 mg | DELAYED_RELEASE_TABLET | Freq: Every day | ORAL | 3 refills | Status: DC

## 2021-06-06 ENCOUNTER — Ambulatory Visit (HOSPITAL_COMMUNITY)
Admission: RE | Admit: 2021-06-06 | Discharge: 2021-06-06 | Disposition: A | Discharge status: Home / Self Care | Payer: 59 | Source: Ambulatory Visit | Attending: Gastroenterology | Admitting: Gastroenterology

## 2021-06-06 ENCOUNTER — Other Ambulatory Visit: Payer: Self-pay

## 2021-06-06 ENCOUNTER — Other Ambulatory Visit: Payer: Self-pay | Admitting: Gastroenterology

## 2021-06-06 DIAGNOSIS — Z9884 Bariatric surgery status: Secondary | ICD-10-CM | POA: Insufficient documentation

## 2021-06-06 DIAGNOSIS — R079 Chest pain, unspecified: Secondary | ICD-10-CM | POA: Diagnosis not present

## 2021-06-06 DIAGNOSIS — N200 Calculus of kidney: Secondary | ICD-10-CM | POA: Diagnosis not present

## 2021-06-06 DIAGNOSIS — R0789 Other chest pain: Secondary | ICD-10-CM | POA: Diagnosis not present

## 2021-06-12 ENCOUNTER — Encounter: Payer: Self-pay | Admitting: Nurse Practitioner

## 2021-06-12 ENCOUNTER — Other Ambulatory Visit (HOSPITAL_COMMUNITY): Payer: Self-pay

## 2021-06-12 ENCOUNTER — Ambulatory Visit (INDEPENDENT_AMBULATORY_CARE_PROVIDER_SITE_OTHER): Payer: 59 | Admitting: Nurse Practitioner

## 2021-06-12 ENCOUNTER — Other Ambulatory Visit (INDEPENDENT_AMBULATORY_CARE_PROVIDER_SITE_OTHER): Payer: 59

## 2021-06-12 VITALS — BP 90/64 | HR 72 | Ht 75.0 in | Wt 206.2 lb

## 2021-06-12 DIAGNOSIS — R17 Unspecified jaundice: Secondary | ICD-10-CM

## 2021-06-12 DIAGNOSIS — R0789 Other chest pain: Secondary | ICD-10-CM

## 2021-06-12 LAB — HEPATIC FUNCTION PANEL
ALT: 15 U/L (ref 0–53)
AST: 21 U/L (ref 0–37)
Albumin: 4.2 g/dL (ref 3.5–5.2)
Alkaline Phosphatase: 79 U/L (ref 39–117)
Bilirubin, Direct: 0.3 mg/dL (ref 0.0–0.3)
Total Bilirubin: 2.3 mg/dL — ABNORMAL HIGH (ref 0.2–1.2)
Total Protein: 6.8 g/dL (ref 6.0–8.3)

## 2021-06-12 MED ORDER — OMEPRAZOLE 20 MG PO CPDR
20.0000 mg | DELAYED_RELEASE_CAPSULE | Freq: Every day | ORAL | 1 refills | Status: DC
  Filled 2021-06-12: qty 30, 30d supply, fill #0

## 2021-06-12 NOTE — Patient Instructions (Addendum)
LABS:  Lab work has been ordered for you today. Our lab is located in the basement. Press "B" on the elevator. The lab is located at the first door on the left as you exit the elevator.  HEALTHCARE LAWS AND MY CHART RESULTS: Due to recent changes in healthcare laws, you may see the results of your imaging and laboratory studies on MyChart before your provider has had a chance to review them.   We understand that in some cases there may be results that are confusing or concerning to you. Not all laboratory results come back in the same time frame and the provider may be waiting for multiple results in order to interpret others.  Please give [place] 48 hours in order for your provider to thoroughly review all the results before contacting the office for clarification of your results.   MEDICATION: We have sent the following medication to your pharmacy for you to pick up at your convenience: Omeprazole 20 MG once a day.  RECOMMENDATIONS: Use over the counter Gaviscon 1 tablespoon 3 times a day if needed. Contact us once you have your echocardiogram completed.  It was great seeing you today! Thank you for entrusting me with your care and choosing [facility].  Noralyn Pick, CRNP  The [facility] providers would like to encourage you to use [facility] to communicate with providers for non-urgent requests or questions.  Due to long hold times on the telephone, sending your provider a message by [facility] may be faster and more efficient way to get a response. Please allow 48 business hours for a response.  Please remember that this is for non-urgent requests/questions.  If you are age 52 or older, your body mass index should be between 23-30. Your Body mass index is 25.78 kg/m. If this is out of the aforementioned range listed, please consider follow up with your Primary Care Provider.  If you are age 52 or younger, your body mass index should be between 19-25. Your Body mass index is  25.78 kg/m. If this is out of the aformentioned range listed, please consider follow up with your Primary Care Provider.

## 2021-06-12 NOTE — Progress Notes (Signed)
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 S/P cholecystectomy secondary to biliary dyskinesia 2004 and s/p gastric bypass. He is followed by Dr. Silverio Decamp. He presents to our office today for further evaluation regarding atypical chest pain.  Three months ago, he developed "a tickle" sensation in his upper chest which initially went away after he coughed a bit.  This upper chest tickle sensation persisted which gradually developed into an ache.  He complains of having central upper chest ache discomfort which often radiates to the right chest which occurs daily for 20 to 30 minutes then goes away and recurs randomly several times throughout the day.  On one occasion, he felt the chest pain while he was on the treadmill so he went to the ED on 05/16/2021 for further evaluation.  A twelve-lead EKG showed NSR with right atrial enlargement.  WBC 4.9.  Hemoglobin 15.3.  Platelet 201.  Total bili 2.5.  Alk phos 84.  Lipase 24.  AST 25.  ALT 18.  Troponin x 2 negative and a chest CTA without evidence of an aortic dissection but showed coronary calcifications. He was discharged home with recommendations for cardiology follow up. He saw his cardiologist Dr. Oswaldo Milian on 05/21/2021 and a coronary CTA and echo were ordered. A coronary CTA was done on 05/31/2021 which showed mild nonobstructive CAD (25-495). An echo was scheduled for 06/22/2021.  He was seen by his pulmonologist Dr. Lamonte Sakai on 05/15/2021 for follow-up regarding a right lower lobe pulmonary nodule which she  was assessed to be related to his past PE and pulmonary infarct which chronic granulomatous disease thought to be benign. He contacted Dr. Silverio Decamp and an upper GI series was ordered to rule out UGI etiology for his chest pain. UGI series 06/06/2021 was consistent with past Roux-en-Y anatomy, the esophagus and visualized small bowel were unremarkable.  No evidence of a hiatal hernia or reflux. He presents to our office today for further GI evaluation.  He continues to have daily upper central and right chest ache type pain.  Eating does not trigger his chest pain.  No nausea or vomiting.  As noted above, he had increased chest pain when he was on the treadmill on 10/5 and he hasn't used the treadmill since then. He is able to climb up 2 flights of stairs without any chest pain, palpitations, dizziness or shortness of breath.  He reports having chronic intermittent RUQ pain for many years.  He has rare heartburn.  No dysphagia.  He is passing normal formed brown bowel movement every other day.  He denies having anxiety.  His weight has been stable at 207 pounds for the past 1 to 2 years. No weight loss 207 lbs stable.  EGD 09/22/2019 showed a normal esophagus and gastric bypass with a normal-sized pouch with intact GJ anastomosis.  A colonoscopy was done on the same date which identified a 3 mm hyperplastic polyp and internal hemorrhoids.  UGI series 06/06/2021: FINDINGS: Multiple small left renal calculi are noted on the KUB. No mass or stricture is noted in the esophagus. No hiatal  hernia or reflux is noted. Status post Roux-en-Y procedure. No definite evidence of obstruction or other abnormality is seen involving the gastric remnant. Contrast flowed easily from the stomach into the jejunum. Duodenal bulb and sweep are unremarkable. IMPRESSION: Status post Roux-en-Y procedure involving stomach. The esophagus and visualized small bowel are unremarkable. Contrast flowed easily from the gastric remnant  into the jejunum. No definite evidence of bowel obstruction is noted. Left nephrolithiasis is noted on KUB.   Coronary CT 05/31/2021: 1. Coronary calcium score of 808. This was 99th percentile for age-, sex, and race-matched controls.  2. Normal coronary origin with right dominance.  3. Diffuse minimal calcified plaque (<25%) in the LAD/LCX.  4. Mild calcified plaque (25-49%) in the mid RCA.  RECOMMENDATIONS: 1. Mild non-obstructive CAD (25-49%). Consider non-atherosclerotic causes of chest pain. Consider preventive therapy and risk factor modification  EGD 09/2019: - Normal esophagus. - Z-line regular, 45 cm from the incisors. - Gastric bypass with a normal-sized pouch and intact staple line. Gastrojejunal anastomosis characterized by healthy appearing mucosa. - Normal examined jejunum. - No specimens collected.  Colonoscopy 09/22/2019: - One 3 mm hyperplastic polyp in the rectum, removed with a cold snare. Resected and retrieved. - Non-bleeding internal hemorrhoids. - The examination was otherwise normal. - 10 year colonoscopy recall  Past Medical History:  Diagnosis Date   Arthritis    Clotting disorder ([place])    blood clot/dvt   History of kidney stones    PAD (peripheral artery disease) ([place])    Varicose veins of both lower extremities    Past Surgical History:  Procedure Laterality Date   CHOLECYSTECTOMY  2004   EXTRACORPOREAL SHOCK WAVE LITHOTRIPSY Left 08/08/2020   Procedure: EXTRACORPOREAL SHOCK WAVE LITHOTRIPSY (ESWL);  Surgeon: Cleon Gustin, MD;  Location: [facility];  Service: Urology;  Laterality: Left;   GASTRIC BYPASS  2005    Allergies  Allergen Reactions   Ciprofloxacin     Altered mental state   Nsaids Nausea And Vomiting    Upset stomach       Outpatient Encounter Medications as of 06/12/2021  Medication Sig   aspirin EC 81 MG tablet Take 1 tablet (81 mg total) by mouth daily. Swallow whole.   buprenorphine-naloxone (SUBOXONE) 8-2 mg SUBL SL  tablet Dissolve 1 tablet under the tongue daily   cyclobenzaprine (FLEXERIL) 10 MG tablet TAKE 1 TABLET BY MOUTH THREE TIMES DAILY AS NEEDED (Patient not taking: Reported on 05/21/2021)   fluconazole (DIFLUCAN) 150 MG tablet TAKE 1 TABLET BY MOUTH ONCE A WEEK (Patient not taking: No sig reported)   gabapentin (NEURONTIN) 300 MG capsule TAKE 1 CAPSULE BY MOUTH THREE TIMES DAILY (Patient not taking: No sig reported)   ketoprofen (ORUDIS) 50 MG capsule Take 1 capsule (50 mg total) by mouth 2 (two) times daily as needed. (Patient not taking: No sig reported)   metoprolol tartrate (LOPRESSOR) 100 MG tablet Take 1 tablet (100 mg) by mouth 2 hours before procedure   Multiple Vitamin (MULTIVITAMIN WITH MINERALS) TABS tablet Take 1 tablet by mouth daily.   tamsulosin (FLOMAX) 0.4 MG CAPS capsule TAKE 1 CAPSULE (0.4 MG TOTAL) BY MOUTH DAILY. (Patient not taking: Reported on 05/21/2021)   traMADol (ULTRAM) 50 MG tablet Take 1 tablet (50 mg total) by mouth every 6 (six) hours as needed. (Patient not taking: No sig reported)   No facility-administered encounter medications on file as of 06/12/2021.   REVIEW OF SYSTEMS:  Gen: Denies fever, sweats or chills. No weight loss.  CV: See HPI.   Resp: See HPI.  No hemoptysis. GI: See HPI.   GU : Denies urinary burning, blood in urine, increased urinary frequency or incontinence. MS: Denies joint pain, muscles aches or weakness. Derm: Denies rash, itchiness, skin lesions or unhealing ulcers. Psych: Denies depression, anxiety or memory loss. Heme: Denies bruising, bleeding. Neuro:  Denies headaches, dizziness or paresthesias. Endo:  Denies any problems with DM, thyroid or adrenal function.  PHYSICAL EXAM: BP 90/64 (BP Location: Left Arm, Patient Position: Sitting, Cuff Size: Normal)   Pulse 72   Ht $R'6\' 3"'NP$  (1.905 m) Comment: height measured without shoes  Wt 206 lb 4 oz (93.6 kg)   BMI 25.78 kg/m   General: 50 year old male in no acute distress. Head:  Normocephalic and atraumatic. Eyes:  Sclerae non-icteric, conjunctive pink. Ears: Normal auditory acuity. Mouth: Dentition intact. No ulcers or lesions.  Neck: Supple, no lymphadenopathy or thyromegaly.  Lungs: Clear bilaterally to auscultation without wheezes, crackles or rhonchi. Chest: Nontender.  No obvious deformities.  No crepitus. Heart: Regular rate and rhythm. No murmur, rub or gallop appreciated.  Abdomen: Soft, nontender, non distended. No masses. No hepatosplenomegaly. Normoactive bowel sounds x 4 quadrants.  Rectal: Deferred. Musculoskeletal: Symmetrical with no gross deformities. Skin: Warm and dry. No rash or lesions on visible extremities. Extremities: No edema. Neurological: Alert oriented x 4, no focal deficits.  Psychological:  Alert and cooperative. Normal mood and affect.  ASSESSMENT AND PLAN:  54) 50 year old male with atypical chest pain: cardiac etiology  vs esophageal vs biliary.  Coronary CT showed mild nonobstructive CAD. EGD 09/22/2019 showed a normal esophagus with intact Roux-en-Y anatomy without evidence of acid reflux. Past cholecystectomy secondary to gallbladder dyskinesia.   -Complete cardiology evaluation, await ECHO results -Empiric low-dose Omeprazole 20 mg 1 p.o. daily -Gaviscon 1 tablespoon p.o. 3 times daily as needed -Defer plans for repeat EGD at this time, await completion of cardiac evaluation/ECHO -? Eventual esophageal manometry if symptoms persist  -Consider MRCP to rule out microscopic choledocholithiasis if direct bili > indirect, see plan in # 2  2) Elevated T. Bili levels with normal Alk phos, AST and ALT levels which is consistent with Gilbert's syndrome -Hepatic panel with direct/indirect  bilirubin levels   3) Mild nonobstructive CAD per coronary CT -Continue follow-up with cardiology, proceed with echo scheduled 06/22/2021  4) PAD  5) Past PE/DVT not on anticoagulation   6) Rectal hyperplastic polyp per colonoscopy  09/2019 -Next colonoscopy due 09/2029    CC:  Rosita Fire, MD

## 2021-06-22 ENCOUNTER — Ambulatory Visit (HOSPITAL_COMMUNITY)
Admission: RE | Admit: 2021-06-22 | Discharge: 2021-06-22 | Disposition: A | Discharge status: Home / Self Care | Payer: 59 | Source: Ambulatory Visit | Attending: Cardiology | Admitting: Cardiology

## 2021-06-22 ENCOUNTER — Other Ambulatory Visit (HOSPITAL_COMMUNITY): Payer: Self-pay

## 2021-06-22 ENCOUNTER — Other Ambulatory Visit: Payer: Self-pay

## 2021-06-22 DIAGNOSIS — R079 Chest pain, unspecified: Secondary | ICD-10-CM | POA: Insufficient documentation

## 2021-06-22 LAB — ECHOCARDIOGRAM COMPLETE
AR max vel: 2.06 cm2
AV Area VTI: 2.11 cm2
AV Area mean vel: 1.98 cm2
AV Mean grad: 3.2 mmHg
AV Peak grad: 6.2 mmHg
Ao pk vel: 1.24 m/s
Area-P 1/2: 2.05 cm2
S' Lateral: 3.1 cm

## 2021-06-22 MED ORDER — BUPRENORPHINE HCL-NALOXONE HCL 8-2 MG SL SUBL
SUBLINGUAL_TABLET | SUBLINGUAL | 0 refills | Status: DC
  Filled 2021-06-22: qty 28, 28d supply, fill #0

## 2021-06-22 NOTE — Progress Notes (Signed)
*  PRELIMINARY RESULTS* Echocardiogram 2D Echocardiogram has been performed.  Samuel Germany 06/22/2021, 3:55 PM

## 2021-06-28 ENCOUNTER — Other Ambulatory Visit (HOSPITAL_COMMUNITY): Payer: Self-pay

## 2021-07-04 DIAGNOSIS — M79605 Pain in left leg: Secondary | ICD-10-CM | POA: Diagnosis not present

## 2021-07-04 DIAGNOSIS — Z09 Encounter for follow-up examination after completed treatment for conditions other than malignant neoplasm: Secondary | ICD-10-CM | POA: Diagnosis not present

## 2021-07-04 DIAGNOSIS — I83813 Varicose veins of bilateral lower extremities with pain: Secondary | ICD-10-CM | POA: Diagnosis not present

## 2021-07-04 DIAGNOSIS — I872 Venous insufficiency (chronic) (peripheral): Secondary | ICD-10-CM | POA: Diagnosis not present

## 2021-07-17 ENCOUNTER — Other Ambulatory Visit (HOSPITAL_COMMUNITY): Payer: Self-pay

## 2021-07-17 DIAGNOSIS — M79605 Pain in left leg: Secondary | ICD-10-CM | POA: Diagnosis not present

## 2021-07-17 DIAGNOSIS — M5459 Other low back pain: Secondary | ICD-10-CM | POA: Diagnosis not present

## 2021-07-17 DIAGNOSIS — Z79891 Long term (current) use of opiate analgesic: Secondary | ICD-10-CM | POA: Diagnosis not present

## 2021-07-17 DIAGNOSIS — F1120 Opioid dependence, uncomplicated: Secondary | ICD-10-CM | POA: Diagnosis not present

## 2021-07-17 DIAGNOSIS — G894 Chronic pain syndrome: Secondary | ICD-10-CM | POA: Diagnosis not present

## 2021-07-17 MED ORDER — BUPRENORPHINE HCL-NALOXONE HCL 8-2 MG SL SUBL
SUBLINGUAL_TABLET | SUBLINGUAL | 1 refills | Status: DC
  Filled 2021-07-23: qty 28, 28d supply, fill #0
  Filled 2021-08-21: qty 28, 28d supply, fill #1

## 2021-07-18 ENCOUNTER — Other Ambulatory Visit (HOSPITAL_COMMUNITY): Payer: Self-pay

## 2021-07-20 ENCOUNTER — Ambulatory Visit: Payer: 59 | Admitting: Urology

## 2021-07-20 DIAGNOSIS — N200 Calculus of kidney: Secondary | ICD-10-CM

## 2021-07-23 ENCOUNTER — Other Ambulatory Visit (HOSPITAL_COMMUNITY): Payer: Self-pay

## 2021-07-29 IMAGING — US US ABDOMEN COMPLETE
1 series · 14 of 25 positions shown · non-contrast
Comparison: None.

CLINICAL DATA: Worsening right-sided abdominal pain for 2 months.
Prior cholecystectomy.

EXAM:
ABDOMEN ULTRASOUND COMPLETE

[Series 1: us abdomen complete · 14 of 88 slices shown]
[im 1/88]
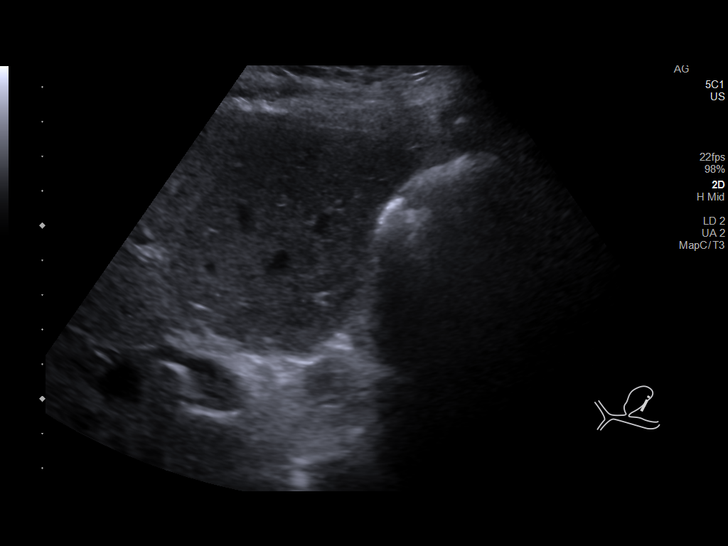
[im 8/88]
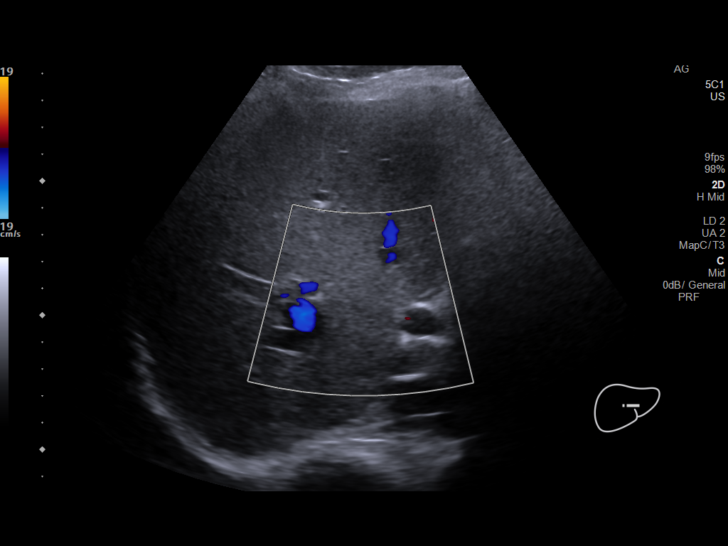
[im 15/88]
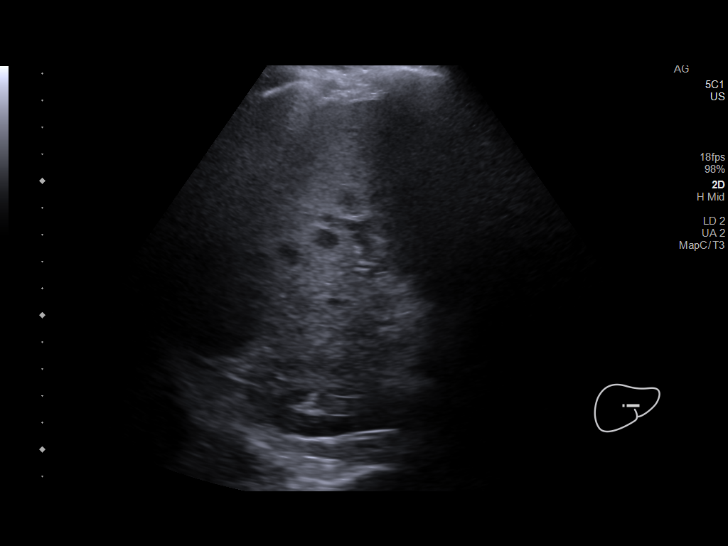
[im 22/88]
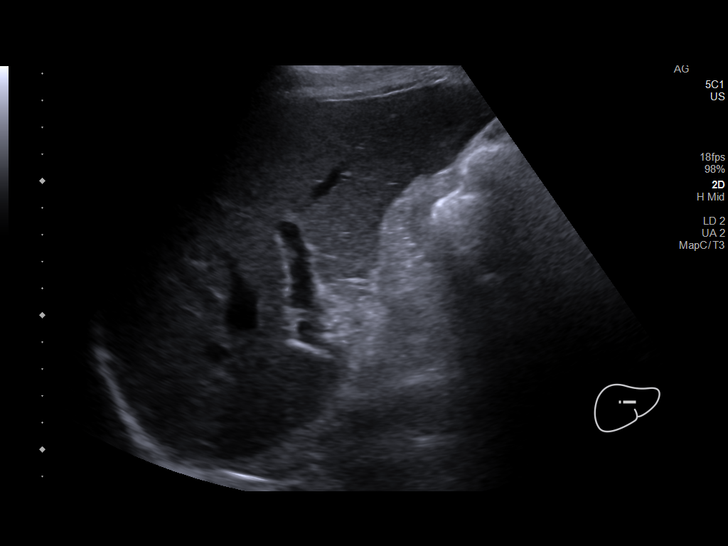
[im 30/88]
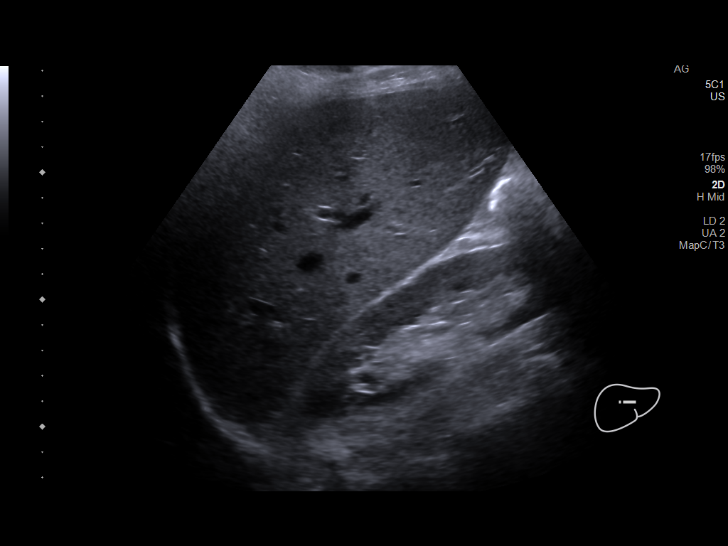
[im 33/88]
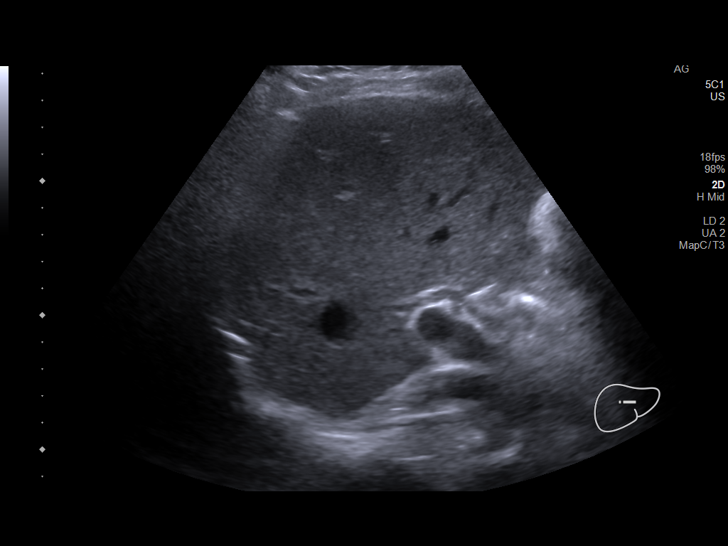
[im 40/88]
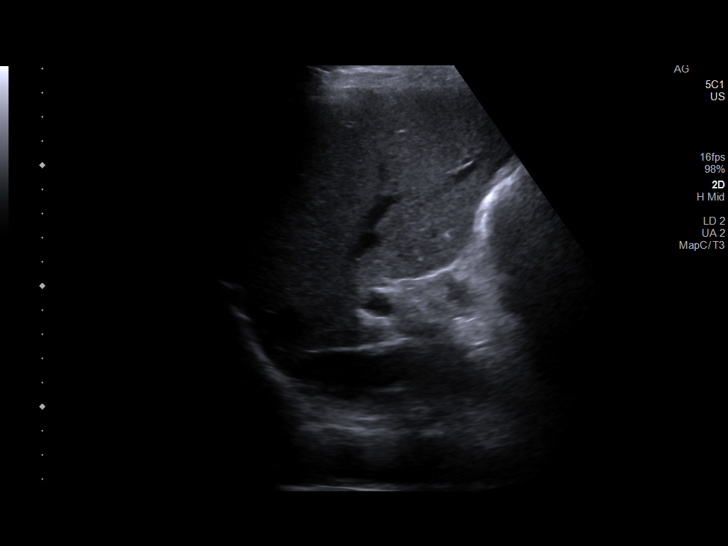
[im 48/88]
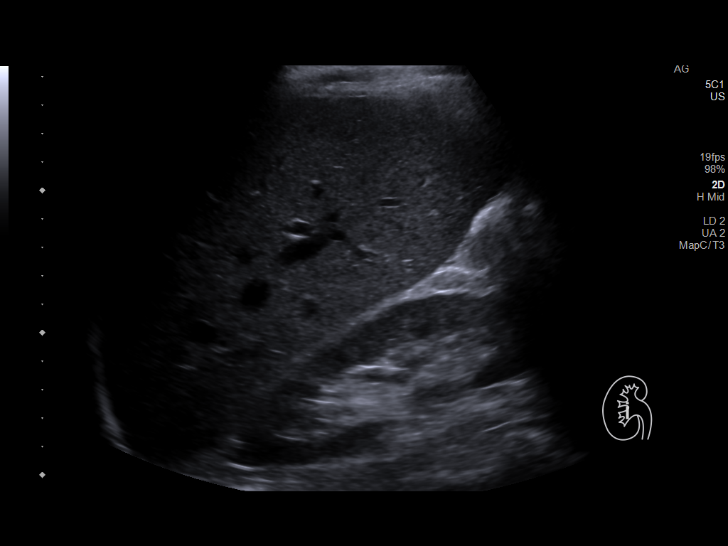
[im 55/88]
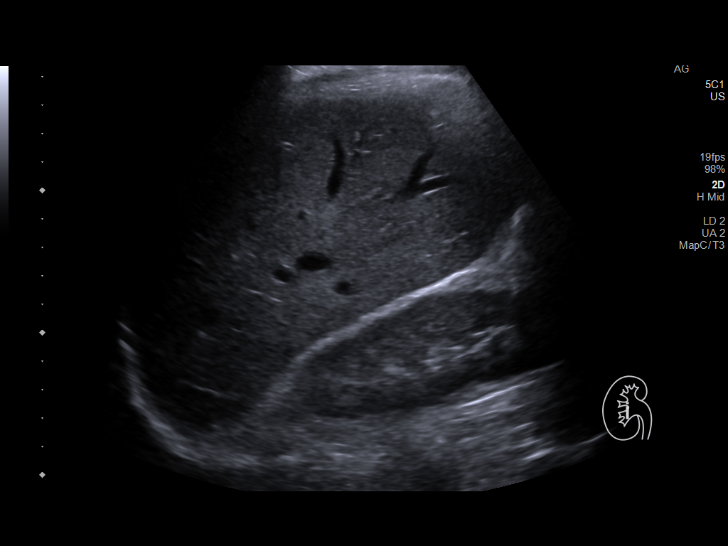
[im 59/88]
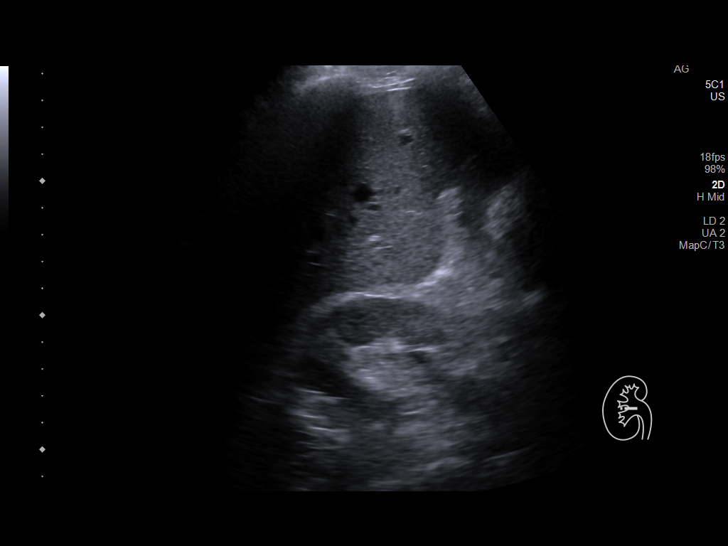
[im 66/88]
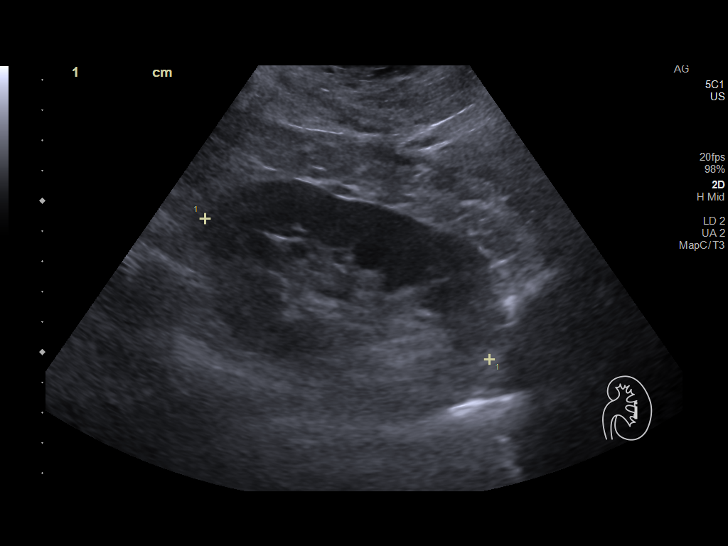
[im 73/88]
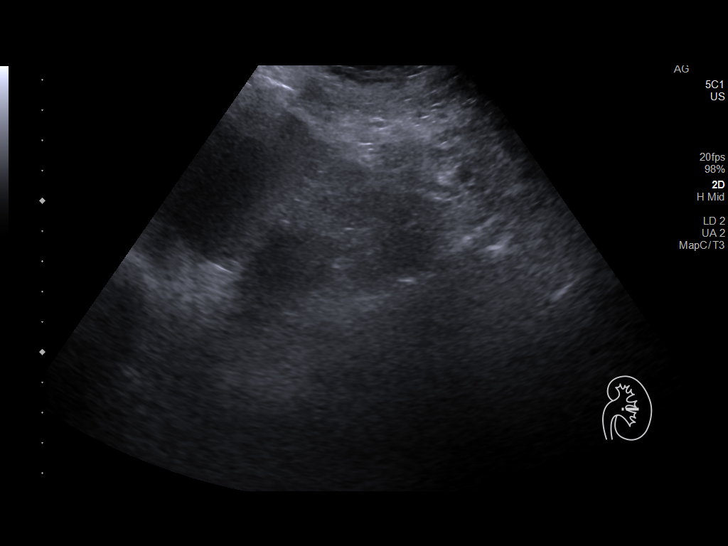
[im 80/88]
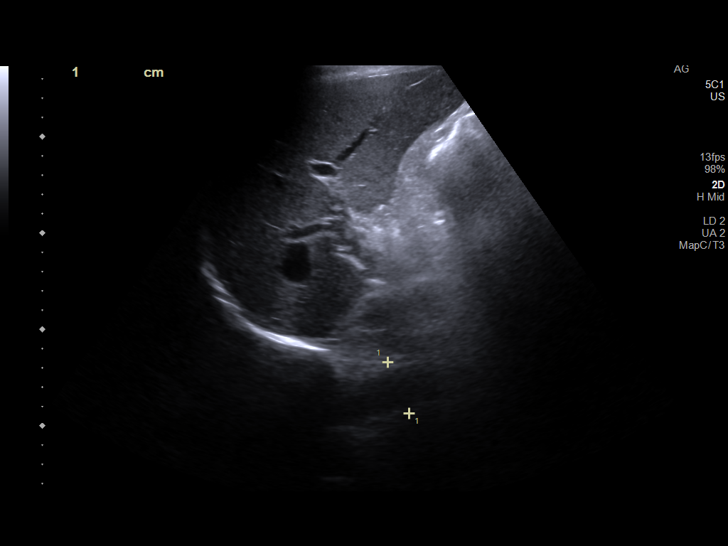
[im 88/88]
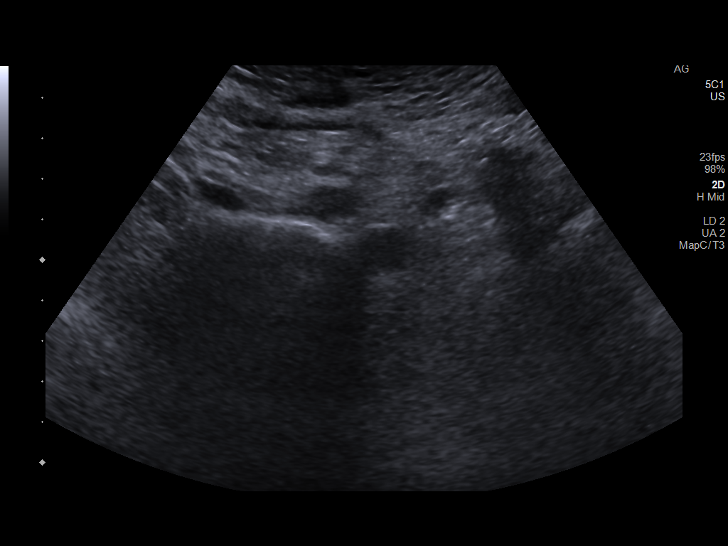

[14 of 25 positions shown; findings below may reference images not displayed]

FINDINGS: Gallbladder: Surgically absent.

Common bile duct: Diameter: 6 mm, within normal limits post
cholecystectomy.

Liver: No focal lesion identified. Within normal limits in
parenchymal echogenicity. Portal vein is patent on color Doppler
imaging with normal direction of blood flow towards the liver.

IVC: No abnormality visualized.

Pancreas: Not visualized due to overlying bowel gas.

Spleen: Size and appearance within normal limits.

Right Kidney: Length: 11.9 cm. Echogenicity within normal limits. No
mass or hydronephrosis visualized.

Left Kidney: Length: 10.5 cm. Echogenicity within normal limits. No
mass or hydronephrosis visualized.

Abdominal aorta: No aneurysm visualized.

Other findings: None.
IMPRESSION: Prior cholecystectomy. No evidence of biliary ductal dilatation or
other significant abnormality.

## 2021-08-09 ENCOUNTER — Other Ambulatory Visit (HOSPITAL_COMMUNITY): Payer: Self-pay

## 2021-08-10 ENCOUNTER — Ambulatory Visit (HOSPITAL_COMMUNITY)
Admission: RE | Admit: 2021-08-10 | Discharge: 2021-08-10 | Disposition: A | Discharge status: Home / Self Care | Payer: 59 | Source: Ambulatory Visit | Attending: Urology | Admitting: Urology

## 2021-08-10 ENCOUNTER — Other Ambulatory Visit (HOSPITAL_COMMUNITY): Payer: Self-pay

## 2021-08-10 ENCOUNTER — Other Ambulatory Visit: Payer: Self-pay

## 2021-08-10 ENCOUNTER — Ambulatory Visit (INDEPENDENT_AMBULATORY_CARE_PROVIDER_SITE_OTHER): Payer: 59 | Admitting: Urology

## 2021-08-10 ENCOUNTER — Encounter: Payer: Self-pay | Admitting: Urology

## 2021-08-10 ENCOUNTER — Other Ambulatory Visit: Payer: Self-pay | Admitting: Urology

## 2021-08-10 VITALS — BP 131/84 | HR 73 | Wt 210.0 lb

## 2021-08-10 DIAGNOSIS — N2889 Other specified disorders of kidney and ureter: Secondary | ICD-10-CM | POA: Diagnosis not present

## 2021-08-10 DIAGNOSIS — N200 Calculus of kidney: Secondary | ICD-10-CM

## 2021-08-10 DIAGNOSIS — Z87442 Personal history of urinary calculi: Secondary | ICD-10-CM | POA: Diagnosis not present

## 2021-08-10 LAB — MICROSCOPIC EXAMINATION
Epithelial Cells (non renal): NONE SEEN /hpf (ref 0–10)
Renal Epithel, UA: NONE SEEN /hpf
WBC, UA: NONE SEEN /hpf (ref 0–5)

## 2021-08-10 LAB — URINALYSIS, ROUTINE W REFLEX MICROSCOPIC
Bilirubin, UA: NEGATIVE
Glucose, UA: NEGATIVE
Ketones, UA: NEGATIVE
Leukocytes,UA: NEGATIVE
Nitrite, UA: NEGATIVE
Protein,UA: NEGATIVE
Specific Gravity, UA: 1.02 (ref 1.005–1.030)
Urobilinogen, Ur: 1 mg/dL (ref 0.2–1.0)
pH, UA: 6.5 (ref 5.0–7.5)

## 2021-08-10 MED ORDER — MORPHINE SULFATE 15 MG PO TABS
15.0000 mg | ORAL_TABLET | ORAL | 0 refills | Status: DC | PRN
  Filled 2021-08-10: qty 30, 5d supply, fill #0

## 2021-08-10 MED ORDER — CYCLOBENZAPRINE HCL 10 MG PO TABS
10.0000 mg | ORAL_TABLET | Freq: Three times a day (TID) | ORAL | 5 refills | Status: DC | PRN
  Filled 2021-08-10: qty 90, 30d supply, fill #0
  Filled 2021-11-20: qty 90, 30d supply, fill #1

## 2021-08-10 NOTE — Patient Instructions (Signed)
Lithotripsy Lithotripsy is a treatment that can help break up kidney stones that are too large to pass on their own. This is a nonsurgical procedure that crushes a kidney stone with shock waves. These shock waves pass through your body and focus on the kidney stone. They cause the kidney stone to break up into smaller pieces while it is still in the urinary tract. The smaller pieces of stone can pass more easily out of your body in the urine. Tell a health care provider about: Any allergies you have. All medicines you are taking, including vitamins, herbs, eye drops, creams, and over-the-counter medicines. Any problems you or family members have had with anesthetic medicines. Any blood disorders you have. Any surgeries you have had. Any medical conditions you have. Whether you are pregnant or may be pregnant. What are the risks? Generally, this is a safe procedure. However, problems may occur, including: Infection. Bleeding from the kidney. Bruising of the kidney or skin. Scarring of the kidney, which can lead to: Increased blood pressure. Poor kidney function. Return (recurrence) of kidney stones. Damage to other structures or organs, such as the liver, colon, spleen, or pancreas. Blockage (obstruction) of the tube that carries urine from the kidney to the bladder (ureter). Failure of the kidney stone to break into pieces (fragments). What happens before the procedure? Staying hydrated Follow instructions from your health care provider about hydration, which may include: Up to 2 hours before the procedure - you may continue to drink clear liquids, such as water, clear fruit juice, black coffee, and plain tea. Eating and drinking restrictions Follow instructions from your health care provider about eating and drinking, which may include: 8 hours before the procedure - stop eating heavy meals or foods, such as meat, fried foods, or fatty foods. 6 hours before the procedure - stop eating  light meals or foods, such as toast or cereal. 6 hours before the procedure - stop drinking milk or drinks that contain milk. 2 hours before the procedure - stop drinking clear liquids. Medicines Ask your health care provider about: Changing or stopping your regular medicines. This is especially important if you are taking diabetes medicines or blood thinners. Taking medicines such as aspirin and ibuprofen. These medicines can thin your blood. Do not take these medicines unless your health care provider tells you to take them. Taking over-the-counter medicines, vitamins, herbs, and supplements. Tests You may have tests, such as: Blood tests. Urine tests. Imaging tests, such as a CT scan. General instructions Plan to have someone take you home from the hospital or clinic. If you will be going home right after the procedure, plan to have someone with you for 24 hours. Ask your health care provider what steps will be taken to help prevent infection. These may include washing skin with a germ-killing soap. What happens during the procedure?  An IV will be inserted into one of your veins. You will be given one or more of the following: A medicine to help you relax (sedative). A medicine to make you fall asleep (general anesthetic). A water-filled cushion may be placed behind your kidney or on your abdomen. In some cases, you may be placed in a tub of lukewarm water. Your body will be positioned in a way that makes it easy to target the kidney stone. An X-ray or ultrasound exam will be done to locate your stone. Shock waves will be aimed at the stone. If you are awake, you may feel a tapping sensation as  the shock waves pass through your body. A flexible tube with holes in it (stent) may be placed in the ureter. This will help keep urine flowing from the kidney if the fragments of the stone have been blocking the ureter. The procedure may vary among health care providers and hospitals. What  happens after the procedure? You may have an X-ray to see whether the procedure was able to break up the kidney stone and how much of the stone has passed. If large stone fragments remain after treatment, you may need to have a second procedure at a later time. Your blood pressure, heart rate, breathing rate, and blood oxygen level will be monitored until you leave the hospital or clinic. You may be given antibiotics or pain medicine as needed. If a stent was placed in your ureter during surgery, it may stay in place for a few weeks. You may need to strain your urine to collect pieces of the kidney stone for testing. You will need to drink plenty of water. If you were given a sedative during the procedure, it can affect you for several hours. Do not drive or operate machinery until your health care provider says that it is safe. Summary Lithotripsy is a treatment that can help break up kidney stones that are too large to pass on their own. Lithotripsy is a nonsurgical procedure that crushes a kidney stone with shock waves. Generally, this is a safe procedure. However, problems may occur, including damage to the kidney or other organs, infection, or obstruction of the tube that carries urine from the kidney to the bladder (ureter). You may have a stent placed in your ureter to help drain your urine. This stent may stay in place for a few weeks. After the procedure, you will need to drink plenty of water. You may be asked to strain your urine to collect pieces of the kidney stone for testing. This information is not intended to replace advice given to you by your health care provider. Make sure you discuss any questions you have with your health care provider. Document Revised: 05/11/2019 Document Reviewed: 05/12/2019 Elsevier Patient Education  [place].

## 2021-08-10 NOTE — H&P (View-Only) (Signed)
08/10/2021 12:22 PM   Paul Sawyer 08/14/1970 [id number]  Referring provider: Rosita Fire, MD [name],  [name] [id number]  nephrolithiasis   HPI: Paul Sawyer is a 50yo here for followup for nephrolithiasis. He denies any flank pain. KUB from today shows a 7-85mm left lower pole calculus. He denies any significant LUTS. No other complaints today.    PMH: Past Medical History:  Diagnosis Date   Arthritis    Atherosclerosis    Clotting disorder ([place])    blood clot/dvt   History of kidney stones    PAD (peripheral artery disease) ([place])    Varicose veins of both lower extremities     Surgical History: Past Surgical History:  Procedure Laterality Date   CHOLECYSTECTOMY  2004   EXTRACORPOREAL SHOCK WAVE LITHOTRIPSY Left 08/08/2020   Procedure: EXTRACORPOREAL SHOCK WAVE LITHOTRIPSY (ESWL);  Surgeon: Paul Gustin, MD;  Location: [facility];  Service: Urology;  Laterality: Left;   GASTRIC BYPASS  2005    Home Medications:  Allergies as of 08/10/2021       Reactions   Ciprofloxacin    Altered mental state   Nsaids Nausea And Vomiting   Upset stomach         Medication List        Accurate as of August 10, 2021 12:22 PM. If you have any questions, ask your nurse or doctor.          aspirin EC 81 MG tablet Take 1 tablet (81 mg total) by mouth daily. Swallow whole.   buprenorphine-naloxone 8-2 mg Subl SL tablet Commonly known as: SUBOXONE Place 1 tablet under the tongue daily   cyclobenzaprine 10 MG tablet Commonly known as: FLEXERIL TAKE 1 TABLET BY MOUTH THREE TIMES DAILY AS NEEDED   multivitamin with minerals Tabs tablet Take 1 tablet by mouth daily.   omeprazole 20 MG capsule Commonly known as: PRILOSEC Take 1 capsule by mouth daily.        Allergies:  Allergies  Allergen Reactions   Ciprofloxacin     Altered mental state   Nsaids Nausea And Vomiting    Upset stomach     Family History: Family History   Problem Relation Age of Onset   Diabetes Father    Heart disease Father    Colon cancer Neg Hx    Esophageal cancer Neg Hx    Rectal cancer Neg Hx    Stomach cancer Neg Hx     Social History:  reports that he has never smoked. He has never used smokeless tobacco. He reports current alcohol use of about 1.0 standard drink per week. He reports that he does not use drugs.  ROS: All other review of systems were reviewed and are negative except what is noted above in HPI  Physical Exam: BP 131/84    Pulse 73    Wt 210 lb (95.3 kg)    BMI 26.25 kg/m   Constitutional:  Alert and oriented, No acute distress. HEENT: Paul Sawyer AT, moist mucus membranes.  Trachea midline, no masses. Cardiovascular: No clubbing, cyanosis, or edema. Respiratory: Normal respiratory effort, no increased work of breathing. GI: Abdomen is soft, nontender, nondistended, no abdominal masses GU: No CVA tenderness.  Lymph: No cervical or inguinal lymphadenopathy. Skin: No rashes, bruises or suspicious lesions. Neurologic: Grossly intact, no focal deficits, moving all 4 extremities. Psychiatric: Normal mood and affect.  Laboratory Data: Lab Results  Component Value Date   WBC 4.9 05/16/2021   HGB 15.3  05/16/2021   HCT 44.7 05/16/2021   MCV 91.4 05/16/2021   PLT 201 05/16/2021    Lab Results  Component Value Date   CREATININE 0.95 05/16/2021    No results found for: PSA  No results found for: TESTOSTERONE  No results found for: HGBA1C  Urinalysis    Component Value Date/Time   COLORURINE YELLOW 07/26/2020 2028   APPEARANCEUR Clear 10/18/2020 0913   LABSPEC 1.014 07/26/2020 2028   PHURINE 7.0 07/26/2020 2028   GLUCOSEU Negative 10/18/2020 0913   HGBUR MODERATE (A) 07/26/2020 2028   BILIRUBINUR Negative 10/18/2020 0913   KETONESUR NEGATIVE 07/26/2020 2028   PROTEINUR Negative 10/18/2020 0913   PROTEINUR NEGATIVE 07/26/2020 2028   NITRITE Negative 10/18/2020 0913   NITRITE NEGATIVE 07/26/2020 2028    LEUKOCYTESUR Negative 10/18/2020 0913   LEUKOCYTESUR NEGATIVE 07/26/2020 2028    Lab Results  Component Value Date   LABMICR See below: 10/18/2020   WBCUA None seen 10/18/2020   LABEPIT None seen 10/18/2020   MUCUS Present 08/25/2020   BACTERIA None seen 10/18/2020    Pertinent Imaging: KUb today: images reviewed and discussed with the patient Results for orders placed during the hospital encounter of 10/03/20  Abdomen 1 view (KUB)  Narrative CLINICAL DATA:  Status post lithotripsy.  EXAM: ABDOMEN - 1 VIEW  COMPARISON:  August 25, 2020  FINDINGS: The bowel gas pattern is normal. A large amount of stool is seen throughout the colon. No radio-opaque calculi or other significant radiographic abnormality are seen. Radiopaque surgical clips are seen overlying the bilateral upper quadrants.  IMPRESSION: 1. No evidence of radiopaque renal calculi. 2. Large stool burden without evidence of bowel obstruction.   Electronically Signed By: Paul Sawyer M.D. On: 10/05/2020 03:26  Results for orders placed during the hospital encounter of 12/01/20  US Venous Img Lower Bilateral (DVT)  Narrative CLINICAL DATA:  Bilateral leg pain and edema  EXAM: BILATERAL LOWER EXTREMITY VENOUS DOPPLER ULTRASOUND  TECHNIQUE: Gray-scale sonography with graded compression, as well as color Doppler and duplex ultrasound were performed to evaluate the lower extremity deep venous systems from the level of the common femoral vein and including the common femoral, femoral, profunda femoral, popliteal and calf veins including the posterior tibial, peroneal and gastrocnemius veins when visible. The superficial great saphenous vein was also interrogated. Spectral Doppler was utilized to evaluate flow at rest and with distal augmentation maneuvers in the common femoral, femoral and popliteal veins.  COMPARISON:  None.  FINDINGS: RIGHT LOWER EXTREMITY  Common Femoral Vein: No evidence  of thrombus. Normal compressibility, respiratory phasicity and response to augmentation.  Saphenofemoral Junction: No evidence of thrombus. Normal compressibility and flow on color Doppler imaging.  Profunda Femoral Vein: No evidence of thrombus. Normal compressibility and flow on color Doppler imaging.  Femoral Vein: No evidence of thrombus. Normal compressibility, respiratory phasicity and response to augmentation.  Popliteal Vein: No evidence of thrombus. Normal compressibility, respiratory phasicity and response to augmentation.  Calf Veins: No evidence of thrombus. Normal compressibility and flow on color Doppler imaging.  LEFT LOWER EXTREMITY  Common Femoral Vein: No evidence of thrombus. Normal compressibility, respiratory phasicity and response to augmentation.  Saphenofemoral Junction: No evidence of thrombus. Normal compressibility and flow on color Doppler imaging.  Profunda Femoral Vein: No evidence of thrombus. Normal compressibility and flow on color Doppler imaging.  Femoral Vein: No evidence of thrombus. Normal compressibility, respiratory phasicity and response to augmentation.  Popliteal Vein: No evidence of thrombus. Normal compressibility, respiratory phasicity and  response to augmentation.  Calf Veins: No evidence of thrombus. Normal compressibility and flow on color Doppler imaging.  IMPRESSION: No evidence of deep venous thrombosis in either lower extremity.   Electronically Signed By: Jerilynn Mages.  Shick M.D. On: 12/01/2020 15:30  No results found for this or any previous visit.  No results found for this or any previous visit.  No results found for this or any previous visit.  No results found for this or any previous visit.  No results found for this or any previous visit.  Results for orders placed during the hospital encounter of 07/26/20  CT Renal Stone Study  Narrative CLINICAL DATA:  Right flank pain, nephrolithiasis,  hematuria  EXAM: CT ABDOMEN AND PELVIS WITHOUT CONTRAST  TECHNIQUE: Multidetector CT imaging of the abdomen and pelvis was performed following the standard protocol without IV contrast.  COMPARISON:  None.  FINDINGS: Lower chest: Mild right basilar scarring. Mild right coronary artery calcification. Global cardiac size within normal limits. No pericardial effusion.  Hepatobiliary: Cholecystectomy has been performed. Liver unremarkable. No intrahepatic biliary ductal dilation. Mild extrahepatic biliary ductal dilation with the bile duct measuring 10 mm in diameter may represent post cholecystectomy change, but is nonspecific.  Pancreas: Unremarkable  Spleen: Unremarkable  Adrenals/Urinary Tract: The adrenal glands are unremarkable. The kidneys are normal in size and position. There are at least 4 nonobstructing calculi within the lower pole of the left kidney identified measuring up to 8 mm in greatest diameter. No hydronephrosis. No urolithiasis. No significant perinephric stranding or perinephric fluid collections identified. The bladder is unremarkable.  Stomach/Bowel: Surgical changes of Roux-en-Y gastric bypass are identified. Moderate stool is seen throughout the colon. The stomach, small bowel, and large bowel are otherwise unremarkable there is no evidence of obstruction or focal inflammation. The appendix is normal. No free intraperitoneal gas or fluid.  Vascular/Lymphatic: Moderate aortoiliac atherosclerotic calcification. No aortic aneurysm. No pathologic adenopathy within the abdomen and pelvis.  Reproductive: Prostate is unremarkable.  Other: Rectum unremarkable.  No abdominal wall hernia identified.  Musculoskeletal: Mild to moderate lumbar levoscoliosis, apex left at L2. Degenerative changes are noted within the lumbar spine. No acute bone abnormality identified.  IMPRESSION: Nonobstructing left nephrolithiasis. No hydronephrosis.  No urolithiasis.  Status post cholecystectomy. Mild dilation of the extrahepatic bile duct is nonspecific, but may represent post cholecystectomy change. Correlation with liver enzymes may be helpful to exclude an obstructive process.  No definite radiographic explanation for the patient's reported symptoms. No acute intra-abdominal pathology identified.  Aortic Atherosclerosis (ICD10-I70.0).   Electronically Signed By: Fidela Salisbury MD On: 07/26/2020 21:00   Assessment & Plan:    1. Kidney stones -We discussed the management of kidney stones. These options include observation, ureteroscopy, shockwave lithotripsy (ESWL) and percutaneous nephrolithotomy (PCNL). We discussed which options are relevant to the patient's stone(s). We discussed the natural history of kidney stones as well as the complications of untreated stones and the impact on quality of life without treatment as well as with each of the above listed treatments. We also discussed the efficacy of each treatment in its ability to clear the stone burden. With any of these management options I discussed the signs and symptoms of infection and the need for emergent treatment should these be experienced. For each option we discussed the ability of each procedure to clear the patient of their stone burden.   For observation I described the risks which include but are not limited to silent renal damage, life-threatening infection, need for emergent  surgery, failure to pass stone and pain.   For ureteroscopy I described the risks which include bleeding, infection, damage to contiguous structures, positioning injury, ureteral stricture, ureteral avulsion, ureteral injury, need for prolonged ureteral stent, inability to perform ureteroscopy, need for an interval procedure, inability to clear stone burden, stent discomfort/pain, heart attack, stroke, pulmonary embolus and the inherent risks with general anesthesia.   For shockwave  lithotripsy I described the risks which include arrhythmia, kidney contusion, kidney hemorrhage, need for transfusion, pain, inability to adequately break up stone, inability to pass stone fragments, Steinstrasse, infection associated with obstructing stones, need for alternate surgical procedure, need for repeat shockwave lithotripsy, MI, CVA, PE and the inherent risks with anesthesia/conscious sedation.   For PCNL I described the risks including positioning injury, pneumothorax, hydrothorax, need for chest tube, inability to clear stone burden, renal laceration, arterial venous fistula or malformation, need for embolization of kidney, loss of kidney or renal function, need for repeat procedure, need for prolonged nephrostomy tube, ureteral avulsion, MI, CVA, PE and the inherent risks of general anesthesia.   - The patient would like to proceed with left ESWL   No follow-ups on file.  Nicolette Bang, MD  [facility] [place]

## 2021-08-10 NOTE — Progress Notes (Signed)

## 2021-08-10 NOTE — Progress Notes (Signed)
08/10/2021 12:22 PM   Paul Sawyer May 09, 1971 [id number]  Referring provider: Rosita Fire, MD [place],  [place] [address]  nephrolithiasis   HPI: [name] is a 50yo here for followup for nephrolithiasis. He denies any flank pain. KUB from today shows a 7-45mm left lower pole calculus. He denies any significant LUTS. No other complaints today.    PMH: Past Medical History:  Diagnosis Date   Arthritis    Atherosclerosis    Clotting disorder ([place])    blood clot/dvt   History of kidney stones    PAD (peripheral artery disease) ([place])    Varicose veins of both lower extremities     Surgical History: Past Surgical History:  Procedure Laterality Date   CHOLECYSTECTOMY  2004   EXTRACORPOREAL SHOCK WAVE LITHOTRIPSY Left 08/08/2020   Procedure: EXTRACORPOREAL SHOCK WAVE LITHOTRIPSY (ESWL);  Surgeon: Cleon Gustin, MD;  Location: [facility];  Service: Urology;  Laterality: Left;   GASTRIC BYPASS  2005    Home Medications:  Allergies as of 08/10/2021       Reactions   Ciprofloxacin    Altered mental state   Nsaids Nausea And Vomiting   Upset stomach         Medication List        Accurate as of August 10, 2021 12:22 PM. If you have any questions, ask your nurse or doctor.          aspirin EC 81 MG tablet Take 1 tablet (81 mg total) by mouth daily. Swallow whole.   buprenorphine-naloxone 8-2 mg Subl SL tablet Commonly known as: SUBOXONE Place 1 tablet under the tongue daily   cyclobenzaprine 10 MG tablet Commonly known as: FLEXERIL TAKE 1 TABLET BY MOUTH THREE TIMES DAILY AS NEEDED   multivitamin with minerals Tabs tablet Take 1 tablet by mouth daily.   omeprazole 20 MG capsule Commonly known as: PRILOSEC Take 1 capsule by mouth daily.        Allergies:  Allergies  Allergen Reactions   Ciprofloxacin     Altered mental state   Nsaids Nausea And Vomiting    Upset stomach     Family History: Family History   Problem Relation Age of Onset   Diabetes Father    Heart disease Father    Colon cancer Neg Hx    Esophageal cancer Neg Hx    Rectal cancer Neg Hx    Stomach cancer Neg Hx     Social History:  reports that he has never smoked. He has never used smokeless tobacco. He reports current alcohol use of about 1.0 standard drink per week. He reports that he does not use drugs.  ROS: All other review of systems were reviewed and are negative except what is noted above in HPI  Physical Exam: BP 131/84    Pulse 73    Wt 210 lb (95.3 kg)    BMI 26.25 kg/m   Constitutional:  Alert and oriented, No acute distress. HEENT: [place] AT, moist mucus membranes.  Trachea midline, no masses. Cardiovascular: No clubbing, cyanosis, or edema. Respiratory: Normal respiratory effort, no increased work of breathing. GI: Abdomen is soft, nontender, nondistended, no abdominal masses GU: No CVA tenderness.  Lymph: No cervical or inguinal lymphadenopathy. Skin: No rashes, bruises or suspicious lesions. Neurologic: Grossly intact, no focal deficits, moving all 4 extremities. Psychiatric: Normal mood and affect.  Laboratory Data: Lab Results  Component Value Date   WBC 4.9 05/16/2021   HGB 15.3  05/16/2021   HCT 44.7 05/16/2021   MCV 91.4 05/16/2021   PLT 201 05/16/2021    Lab Results  Component Value Date   CREATININE 0.95 05/16/2021    No results found for: PSA  No results found for: TESTOSTERONE  No results found for: HGBA1C  Urinalysis    Component Value Date/Time   COLORURINE YELLOW 07/26/2020 2028   APPEARANCEUR Clear 10/18/2020 0913   LABSPEC 1.014 07/26/2020 2028   PHURINE 7.0 07/26/2020 2028   GLUCOSEU Negative 10/18/2020 0913   HGBUR MODERATE (A) 07/26/2020 2028   BILIRUBINUR Negative 10/18/2020 0913   KETONESUR NEGATIVE 07/26/2020 2028   PROTEINUR Negative 10/18/2020 0913   PROTEINUR NEGATIVE 07/26/2020 2028   NITRITE Negative 10/18/2020 0913   NITRITE NEGATIVE 07/26/2020 2028    LEUKOCYTESUR Negative 10/18/2020 0913   LEUKOCYTESUR NEGATIVE 07/26/2020 2028    Lab Results  Component Value Date   LABMICR See below: 10/18/2020   WBCUA None seen 10/18/2020   LABEPIT None seen 10/18/2020   MUCUS Present 08/25/2020   BACTERIA None seen 10/18/2020    Pertinent Imaging: KUb today: images reviewed and discussed with the patient Results for orders placed during the hospital encounter of 10/03/20  Abdomen 1 view (KUB)  Narrative CLINICAL DATA:  Status post lithotripsy.  EXAM: ABDOMEN - 1 VIEW  COMPARISON:  August 25, 2020  FINDINGS: The bowel gas pattern is normal. A large amount of stool is seen throughout the colon. No radio-opaque calculi or other significant radiographic abnormality are seen. Radiopaque surgical clips are seen overlying the bilateral upper quadrants.  IMPRESSION: 1. No evidence of radiopaque renal calculi. 2. Large stool burden without evidence of bowel obstruction.   Electronically Signed By: Virgina Norfolk M.D. On: 10/05/2020 03:26  Results for orders placed during the hospital encounter of 12/01/20  US Venous Img Lower Bilateral (DVT)  Narrative CLINICAL DATA:  Bilateral leg pain and edema  EXAM: BILATERAL LOWER EXTREMITY VENOUS DOPPLER ULTRASOUND  TECHNIQUE: Gray-scale sonography with graded compression, as well as color Doppler and duplex ultrasound were performed to evaluate the lower extremity deep venous systems from the level of the common femoral vein and including the common femoral, femoral, profunda femoral, popliteal and calf veins including the posterior tibial, peroneal and gastrocnemius veins when visible. The superficial great saphenous vein was also interrogated. Spectral Doppler was utilized to evaluate flow at rest and with distal augmentation maneuvers in the common femoral, femoral and popliteal veins.  COMPARISON:  None.  FINDINGS: RIGHT LOWER EXTREMITY  Common Femoral Vein: No evidence  of thrombus. Normal compressibility, respiratory phasicity and response to augmentation.  Saphenofemoral Junction: No evidence of thrombus. Normal compressibility and flow on color Doppler imaging.  Profunda Femoral Vein: No evidence of thrombus. Normal compressibility and flow on color Doppler imaging.  Femoral Vein: No evidence of thrombus. Normal compressibility, respiratory phasicity and response to augmentation.  Popliteal Vein: No evidence of thrombus. Normal compressibility, respiratory phasicity and response to augmentation.  Calf Veins: No evidence of thrombus. Normal compressibility and flow on color Doppler imaging.  LEFT LOWER EXTREMITY  Common Femoral Vein: No evidence of thrombus. Normal compressibility, respiratory phasicity and response to augmentation.  Saphenofemoral Junction: No evidence of thrombus. Normal compressibility and flow on color Doppler imaging.  Profunda Femoral Vein: No evidence of thrombus. Normal compressibility and flow on color Doppler imaging.  Femoral Vein: No evidence of thrombus. Normal compressibility, respiratory phasicity and response to augmentation.  Popliteal Vein: No evidence of thrombus. Normal compressibility, respiratory phasicity and  response to augmentation.  Calf Veins: No evidence of thrombus. Normal compressibility and flow on color Doppler imaging.  IMPRESSION: No evidence of deep venous thrombosis in either lower extremity.   Electronically Signed By: Jerilynn Mages.  Shick M.D. On: 12/01/2020 15:30  No results found for this or any previous visit.  No results found for this or any previous visit.  No results found for this or any previous visit.  No results found for this or any previous visit.  No results found for this or any previous visit.  Results for orders placed during the hospital encounter of 07/26/20  CT Renal Stone Study  Narrative CLINICAL DATA:  Right flank pain, nephrolithiasis,  hematuria  EXAM: CT ABDOMEN AND PELVIS WITHOUT CONTRAST  TECHNIQUE: Multidetector CT imaging of the abdomen and pelvis was performed following the standard protocol without IV contrast.  COMPARISON:  None.  FINDINGS: Lower chest: Mild right basilar scarring. Mild right coronary artery calcification. Global cardiac size within normal limits. No pericardial effusion.  Hepatobiliary: Cholecystectomy has been performed. Liver unremarkable. No intrahepatic biliary ductal dilation. Mild extrahepatic biliary ductal dilation with the bile duct measuring 10 mm in diameter may represent post cholecystectomy change, but is nonspecific.  Pancreas: Unremarkable  Spleen: Unremarkable  Adrenals/Urinary Tract: The adrenal glands are unremarkable. The kidneys are normal in size and position. There are at least 4 nonobstructing calculi within the lower pole of the left kidney identified measuring up to 8 mm in greatest diameter. No hydronephrosis. No urolithiasis. No significant perinephric stranding or perinephric fluid collections identified. The bladder is unremarkable.  Stomach/Bowel: Surgical changes of Roux-en-Y gastric bypass are identified. Moderate stool is seen throughout the colon. The stomach, small bowel, and large bowel are otherwise unremarkable there is no evidence of obstruction or focal inflammation. The appendix is normal. No free intraperitoneal gas or fluid.  Vascular/Lymphatic: Moderate aortoiliac atherosclerotic calcification. No aortic aneurysm. No pathologic adenopathy within the abdomen and pelvis.  Reproductive: Prostate is unremarkable.  Other: Rectum unremarkable.  No abdominal wall hernia identified.  Musculoskeletal: Mild to moderate lumbar levoscoliosis, apex left at L2. Degenerative changes are noted within the lumbar spine. No acute bone abnormality identified.  IMPRESSION: Nonobstructing left nephrolithiasis. No hydronephrosis.  No urolithiasis.  Status post cholecystectomy. Mild dilation of the extrahepatic bile duct is nonspecific, but may represent post cholecystectomy change. Correlation with liver enzymes may be helpful to exclude an obstructive process.  No definite radiographic explanation for the patient's reported symptoms. No acute intra-abdominal pathology identified.  Aortic Atherosclerosis (ICD10-I70.0).   Electronically Signed By: Fidela Salisbury MD On: 07/26/2020 21:00   Assessment & Plan:    1. Kidney stones -We discussed the management of kidney stones. These options include observation, ureteroscopy, shockwave lithotripsy (ESWL) and percutaneous nephrolithotomy (PCNL). We discussed which options are relevant to the patient's stone(s). We discussed the natural history of kidney stones as well as the complications of untreated stones and the impact on quality of life without treatment as well as with each of the above listed treatments. We also discussed the efficacy of each treatment in its ability to clear the stone burden. With any of these management options I discussed the signs and symptoms of infection and the need for emergent treatment should these be experienced. For each option we discussed the ability of each procedure to clear the patient of their stone burden.   For observation I described the risks which include but are not limited to silent renal damage, life-threatening infection, need for emergent  surgery, failure to pass stone and pain.   For ureteroscopy I described the risks which include bleeding, infection, damage to contiguous structures, positioning injury, ureteral stricture, ureteral avulsion, ureteral injury, need for prolonged ureteral stent, inability to perform ureteroscopy, need for an interval procedure, inability to clear stone burden, stent discomfort/pain, heart attack, stroke, pulmonary embolus and the inherent risks with general anesthesia.   For shockwave  lithotripsy I described the risks which include arrhythmia, kidney contusion, kidney hemorrhage, need for transfusion, pain, inability to adequately break up stone, inability to pass stone fragments, Steinstrasse, infection associated with obstructing stones, need for alternate surgical procedure, need for repeat shockwave lithotripsy, MI, CVA, PE and the inherent risks with anesthesia/conscious sedation.   For PCNL I described the risks including positioning injury, pneumothorax, hydrothorax, need for chest tube, inability to clear stone burden, renal laceration, arterial venous fistula or malformation, need for embolization of kidney, loss of kidney or renal function, need for repeat procedure, need for prolonged nephrostomy tube, ureteral avulsion, MI, CVA, PE and the inherent risks of general anesthesia.   - The patient would like to proceed with left ESWL   No follow-ups on file.  Nicolette Bang, MD  [facility] [place]

## 2021-08-10 NOTE — Progress Notes (Signed)
Surgical Physician Order Form [facility]  * Scheduling expectation :  08/21/2021  *Length of Case: 60 minutes  *MD Preforming Case: Nicolette Bang, MD  *Assistant Needed: no  *Facility Preference: [facility]  *Clearance needed: no  *Anticoagulation Instructions: Hold all anticoagulants  *Aspirin Instructions: Hold Aspirin  -Admit type: OUTpatient  -Anesthesia: MAC  -Use Standing Orders: ESWL  *Diagnosis: Left Nephrolithiasis  *Procedure: left  ESWL  Additional orders: N/A  -Equipment:  NA -VTE Prophylaxis Standing Order SCDs       Other:   -Standing Lab Orders Per Anesthesia    Lab other: KUB day of Procedure  -Standing Test orders EKG/Chest x-ray per Anesthesia       Test other:   - Medications:      -Other orders:  [facility] NEED[facility] TO KNOW THE PATIENT IS ON SUBOXONE  *Post-op visit Date/Instructions:  KUB 2 WEEKS

## 2021-08-14 NOTE — Progress Notes (Signed)
Tried calling patient x 2. VM full.

## 2021-08-15 ENCOUNTER — Telehealth: Payer: Self-pay

## 2021-08-15 ENCOUNTER — Other Ambulatory Visit: Payer: Self-pay

## 2021-08-15 DIAGNOSIS — N200 Calculus of kidney: Secondary | ICD-10-CM

## 2021-08-15 NOTE — Progress Notes (Signed)
Spoke with Estill Bamberg, RN for [facility]. She advised that patient will come for his lithotripsy next week, he was advised while in the office. I have added patient on for 08/21/2021.

## 2021-08-15 NOTE — Telephone Encounter (Signed)
I have called to schedule patient for litho x 3 times. Each time I have been sent to voicemail and VM is full. Will try again.

## 2021-08-15 NOTE — Pre-Procedure Instructions (Signed)
Booking has Dr Alyson Ingles as Psychologist, sport and exercise and consent says Dr Chrystine Oiler. Interoffice message Gerald Leitz, CNA to Scientist, forensic.

## 2021-08-15 NOTE — Progress Notes (Signed)
°  Thank you for choosing [facility] to assist in your urologic care for your upcoming surgery. The following information below includes specific dates and details related to surgery:  The Surgical Procedure you are scheduled to have performed is Extracorporeal Shockwave lithotripsy  Surgery Date: 08/21/2021   Physician performing the surgery: Dr. Nicolette Bang, MD  Do not eat or drink after midnight the day before your surgery.   You will need a driver the day of surgery and will not be able to operate heavy machinery for 24 hours after.   Your surgery will be performed at  [facility] [address], [place] [address]   Enter at the Main Entrance and check in at the [place] desk.     Pre-Admit Testing Info   Pre- Admit appointments are interview with an anesthesiologist or a pre-operative anesthesia nurse. These appointments are typically completed as an in person visit but can take place over the telephone.  You will be contacted to confirm the date and time window.    If you have any questions or concerns, please don't hesitate to call the office at [phone] option 3 for surgery.   Thank you,   Gerald Leitz, [place] Deborra Medina) Clinical Surgery Coordinator [facility]

## 2021-08-15 NOTE — Addendum Note (Signed)
Addended by: Gerald Leitz A on: 08/15/2021 02:55 PM   Modules accepted: Orders

## 2021-08-17 ENCOUNTER — Encounter (HOSPITAL_COMMUNITY)
Admission: RE | Admit: 2021-08-17 | Discharge: 2021-08-17 | Disposition: A | Discharge status: Home / Self Care | Payer: 59 | Source: Ambulatory Visit | Attending: Urology | Admitting: Urology

## 2021-08-17 NOTE — Progress Notes (Signed)
Reviewed and agree with documentation and assessment and plan. K. Veena Diego Ulbricht , MD   

## 2021-08-21 ENCOUNTER — Ambulatory Visit (HOSPITAL_COMMUNITY)
Admission: RE | Admit: 2021-08-21 | Discharge: 2021-08-21 | Disposition: A | Discharge status: Home / Self Care | Payer: 59 | Attending: Urology | Admitting: Urology

## 2021-08-21 ENCOUNTER — Other Ambulatory Visit: Payer: Self-pay

## 2021-08-21 ENCOUNTER — Other Ambulatory Visit (HOSPITAL_COMMUNITY): Payer: Self-pay

## 2021-08-21 ENCOUNTER — Encounter (HOSPITAL_COMMUNITY)
Admission: RE | Disposition: A | Discharge status: Home / Self Care | Payer: Self-pay | Source: Home / Self Care | Attending: Urology

## 2021-08-21 ENCOUNTER — Encounter (HOSPITAL_COMMUNITY): Payer: Self-pay | Admitting: Urology

## 2021-08-21 ENCOUNTER — Ambulatory Visit (HOSPITAL_COMMUNITY): Payer: 59

## 2021-08-21 DIAGNOSIS — N200 Calculus of kidney: Secondary | ICD-10-CM | POA: Diagnosis not present

## 2021-08-21 DIAGNOSIS — Z87442 Personal history of urinary calculi: Secondary | ICD-10-CM | POA: Diagnosis not present

## 2021-08-21 HISTORY — PX: EXTRACORPOREAL SHOCK WAVE LITHOTRIPSY: SHX1557

## 2021-08-21 SURGERY — LITHOTRIPSY, ESWL
Anesthesia: Monitor Anesthesia Care | Laterality: Left

## 2021-08-21 MED ORDER — ONDANSETRON HCL 4 MG PO TABS
4.0000 mg | ORAL_TABLET | Freq: Every day | ORAL | 1 refills | Status: AC | PRN
  Filled 2021-08-21: qty 30, 30d supply, fill #0

## 2021-08-21 MED ORDER — DIPHENHYDRAMINE HCL 25 MG PO CAPS
25.0000 mg | ORAL_CAPSULE | ORAL | Status: AC
  Administered 2021-08-21: 25 mg via ORAL
  Filled 2021-08-21: qty 1

## 2021-08-21 MED ORDER — DIAZEPAM 5 MG PO TABS
10.0000 mg | ORAL_TABLET | Freq: Once | ORAL | Status: AC
  Administered 2021-08-21: 10 mg via ORAL
  Filled 2021-08-21: qty 2

## 2021-08-21 MED ORDER — SODIUM CHLORIDE 0.9 % IV SOLN: INTRAVENOUS | Status: DC

## 2021-08-21 MED ORDER — ONDANSETRON HCL 4 MG/2ML IJ SOLN
INTRAMUSCULAR | Status: AC
  Filled 2021-08-21: qty 2

## 2021-08-21 MED ORDER — ONDANSETRON HCL 4 MG/2ML IJ SOLN
4.0000 mg | Freq: Once | INTRAMUSCULAR | Status: AC
  Administered 2021-08-21: 4 mg via INTRAVENOUS

## 2021-08-21 MED ORDER — MORPHINE SULFATE 15 MG PO TABS
15.0000 mg | ORAL_TABLET | ORAL | 0 refills | Status: DC | PRN
  Filled 2021-08-21: qty 30, 5d supply, fill #0

## 2021-08-21 NOTE — Interval H&P Note (Signed)
History and Physical Interval Note:  08/21/2021 8:49 AM  Paul Sawyer  has presented today for surgery, with the diagnosis of Left Nephrolithiasis.  The various methods of treatment have been discussed with the patient and family. After consideration of risks, benefits and other options for treatment, the patient has consented to  Procedure(s): EXTRACORPOREAL SHOCK WAVE LITHOTRIPSY (ESWL) (Left) as a surgical intervention.  The patient's history has been reviewed, patient examined, no change in status, stable for surgery.  I have reviewed the patient's chart and labs.  Questions were answered to the patient's satisfaction.     Nicolette Bang

## 2021-08-22 ENCOUNTER — Other Ambulatory Visit (HOSPITAL_COMMUNITY): Payer: Self-pay

## 2021-08-22 ENCOUNTER — Encounter (HOSPITAL_COMMUNITY): Payer: Self-pay | Admitting: Urology

## 2021-09-03 ENCOUNTER — Ambulatory Visit: Payer: 59 | Admitting: Physician Assistant

## 2021-09-03 DIAGNOSIS — R351 Nocturia: Secondary | ICD-10-CM

## 2021-09-03 DIAGNOSIS — N138 Other obstructive and reflux uropathy: Secondary | ICD-10-CM

## 2021-09-03 DIAGNOSIS — N200 Calculus of kidney: Secondary | ICD-10-CM

## 2021-09-13 ENCOUNTER — Ambulatory Visit: Payer: 59 | Admitting: Physician Assistant

## 2021-09-26 ENCOUNTER — Other Ambulatory Visit (HOSPITAL_COMMUNITY): Payer: Self-pay

## 2021-10-05 ENCOUNTER — Other Ambulatory Visit (HOSPITAL_COMMUNITY): Payer: Self-pay

## 2021-10-05 MED ORDER — BUPRENORPHINE HCL-NALOXONE HCL 8-2 MG SL SUBL
SUBLINGUAL_TABLET | SUBLINGUAL | 0 refills | Status: DC
  Filled 2021-10-05: qty 5, 5d supply, fill #0

## 2021-10-09 ENCOUNTER — Other Ambulatory Visit (HOSPITAL_COMMUNITY): Payer: Self-pay

## 2021-10-09 DIAGNOSIS — G894 Chronic pain syndrome: Secondary | ICD-10-CM | POA: Diagnosis not present

## 2021-10-09 DIAGNOSIS — M79605 Pain in left leg: Secondary | ICD-10-CM | POA: Diagnosis not present

## 2021-10-09 DIAGNOSIS — F1120 Opioid dependence, uncomplicated: Secondary | ICD-10-CM | POA: Diagnosis not present

## 2021-10-09 DIAGNOSIS — M5459 Other low back pain: Secondary | ICD-10-CM | POA: Diagnosis not present

## 2021-10-09 DIAGNOSIS — Z79891 Long term (current) use of opiate analgesic: Secondary | ICD-10-CM | POA: Diagnosis not present

## 2021-10-09 MED ORDER — BUPRENORPHINE HCL-NALOXONE HCL 8-2 MG SL FILM
ORAL_FILM | SUBLINGUAL | 2 refills | Status: DC
  Filled 2021-10-09: qty 28, 28d supply, fill #0
  Filled 2021-11-20: qty 28, 28d supply, fill #1
  Filled 2022-01-24: qty 28, 28d supply, fill #2

## 2021-10-09 MED ORDER — CYCLOBENZAPRINE HCL 10 MG PO TABS
ORAL_TABLET | ORAL | 5 refills | Status: DC
  Filled 2021-10-09: qty 90, 30d supply, fill #0
  Filled 2021-11-06: qty 90, 30d supply, fill #1
  Filled 2021-12-11: qty 90, 30d supply, fill #2
  Filled 2022-01-10: qty 90, 30d supply, fill #3
  Filled 2022-02-07: qty 90, 30d supply, fill #4
  Filled 2022-03-05: qty 90, 30d supply, fill #5

## 2021-10-11 DIAGNOSIS — K911 Postgastric surgery syndromes: Secondary | ICD-10-CM | POA: Diagnosis not present

## 2021-10-11 DIAGNOSIS — M5136 Other intervertebral disc degeneration, lumbar region: Secondary | ICD-10-CM | POA: Diagnosis not present

## 2021-10-11 DIAGNOSIS — Z0001 Encounter for general adult medical examination with abnormal findings: Secondary | ICD-10-CM | POA: Diagnosis not present

## 2021-10-11 DIAGNOSIS — I872 Venous insufficiency (chronic) (peripheral): Secondary | ICD-10-CM | POA: Diagnosis not present

## 2021-10-12 ENCOUNTER — Telehealth: Payer: Self-pay

## 2021-10-12 ENCOUNTER — Encounter: Payer: Self-pay | Admitting: Podiatry

## 2021-10-12 ENCOUNTER — Other Ambulatory Visit: Payer: Self-pay

## 2021-10-12 ENCOUNTER — Ambulatory Visit (INDEPENDENT_AMBULATORY_CARE_PROVIDER_SITE_OTHER): Payer: 59 | Admitting: Podiatry

## 2021-10-12 DIAGNOSIS — Q6671 Congenital pes cavus, right foot: Secondary | ICD-10-CM | POA: Diagnosis not present

## 2021-10-12 DIAGNOSIS — B351 Tinea unguium: Secondary | ICD-10-CM | POA: Diagnosis not present

## 2021-10-12 NOTE — Telephone Encounter (Signed)
Pt called with requests to be seen for BLE numbness with certain movements/body positions and pain. He feels this is worsening and does not feel it is related to anything other than something vascular. He just saw his podiatrist who mentioned an arteriogram may be beneficial and he would like to discuss with MD. I have scheduled him for ABI and MD visit. Pt is aware of this date/time.  ?

## 2021-10-14 NOTE — Progress Notes (Signed)
Subjective:  ? ?Patient ID: Paul Sawyer, male   DOB: 51 y.o.   MRN: [id number]  ? ?HPI ?Patient presents concerned about toenail fungus and also concerned about his high arches and some discomfort that is mild that he gets in his arches ? ? ?ROS ? ? ?   ?Objective:  ?Physical Exam  ?Neurovascular status intact with mild distal discoloration of the hallux nails bilateral but localized with high arch structural cavus deformity mild discomfort within the fascia ? ?   ?Assessment:  ?Difficult to ascertain on whether or not were dealing with trauma and fungus of his nailbeds especially second right but I do think that it is more trauma and its orientation and I educated him on the difference between the 2.  Most likely fasciitis of the low nature right over left ? ?   ?Plan:  ?Occasion rendered do not recommend treatment currently but he should wear good support shoes and if symptoms were to progress or nail disease progressed we may consider more aggressive treatment plan but at this point I would call a trauma with no good treatment that can be utilized ?   ? ? ?

## 2021-10-15 ENCOUNTER — Ambulatory Visit (HOSPITAL_COMMUNITY): Payer: 59

## 2021-10-15 ENCOUNTER — Other Ambulatory Visit: Payer: Self-pay

## 2021-10-15 DIAGNOSIS — I739 Peripheral vascular disease, unspecified: Secondary | ICD-10-CM

## 2021-10-16 ENCOUNTER — Ambulatory Visit (HOSPITAL_COMMUNITY): Payer: 59 | Admitting: Physical Therapy

## 2021-10-18 ENCOUNTER — Ambulatory Visit (HOSPITAL_COMMUNITY): Payer: 59 | Admitting: Physical Therapy

## 2021-10-19 ENCOUNTER — Encounter (HOSPITAL_COMMUNITY): Payer: 59

## 2021-10-22 ENCOUNTER — Ambulatory Visit (HOSPITAL_COMMUNITY): Payer: 59 | Admitting: Physical Therapy

## 2021-10-22 ENCOUNTER — Telehealth (HOSPITAL_COMMUNITY): Payer: Self-pay | Admitting: Physical Therapy

## 2021-10-22 NOTE — Telephone Encounter (Signed)
Pt did not show for appt, however has not been evaluated yet for lymphedema treatment.  Called and left VM with patient and with spouse regarding scheduling and wishes to resume or be removed at this time. Requested to contact clinic regarding next appt on Wednesday at 1:15. ? ?Romen Yutzy Sula Soda, PTA/CLT, WTA ?[phone] ? ?

## 2021-10-23 ENCOUNTER — Other Ambulatory Visit (HOSPITAL_COMMUNITY): Payer: Self-pay | Admitting: Gerontology

## 2021-10-23 DIAGNOSIS — M25561 Pain in right knee: Secondary | ICD-10-CM

## 2021-10-24 ENCOUNTER — Ambulatory Visit (HOSPITAL_COMMUNITY): Payer: 59 | Admitting: Physical Therapy

## 2021-10-25 ENCOUNTER — Ambulatory Visit (INDEPENDENT_AMBULATORY_CARE_PROVIDER_SITE_OTHER): Payer: 59 | Admitting: Vascular Surgery

## 2021-10-25 ENCOUNTER — Encounter: Payer: Self-pay | Admitting: Vascular Surgery

## 2021-10-25 ENCOUNTER — Ambulatory Visit (HOSPITAL_COMMUNITY)
Admission: RE | Admit: 2021-10-25 | Discharge: 2021-10-25 | Disposition: A | Discharge status: Home / Self Care | Payer: 59 | Source: Ambulatory Visit | Attending: Vascular Surgery | Admitting: Vascular Surgery

## 2021-10-25 ENCOUNTER — Other Ambulatory Visit: Payer: Self-pay

## 2021-10-25 VITALS — BP 115/68 | HR 59 | Temp 98.1°F | Resp 20 | Ht 75.0 in | Wt 205.0 lb

## 2021-10-25 DIAGNOSIS — I872 Venous insufficiency (chronic) (peripheral): Secondary | ICD-10-CM

## 2021-10-25 DIAGNOSIS — I739 Peripheral vascular disease, unspecified: Secondary | ICD-10-CM | POA: Diagnosis not present

## 2021-10-25 NOTE — Progress Notes (Signed)
? ? ?REASON FOR VISIT:  ? ?Leg pain. ? ?MEDICAL ISSUES:  ? ?LEG PAIN: This patient has multiple complaints referable to his legs.  His chief complaint is cramping in the legs.  He also describes pressure on the back of his thighs when he is sitting and on the back of his calves when his legs are elevated.  I reassured him that he has no evidence of arterial insufficiency.  He has palpable pedal pulses, normal Doppler signals in the feet, and normal pressures including toe pressures.  Likewise I do not think his symptoms could be attributed to chronic venous insufficiency.  He does have a history of lower back pain and disc problems and certainly some of the pain could be related to this.  He does have a neurologist.  The only other consideration I would have would be a rheumatologist to look for some unusual musculoskeletal condition that might explain his symptoms.  However I again reassured him that I do not think his symptoms are related to arterial insufficiency.  He does have some underlying chronic venous insufficiency but his symptoms are not classic for symptoms related to venous hypertension.  I be happy to see him back at any time if he develops any new vascular issues. ? ? ?HPI:  ? ?Paul Sawyer is a pleasant 51 y.o. male who I last saw on 05/03/2021 with leg pain.  He was having leg cramps and leg pain.  I did not think that his symptoms could be attributed to peripheral arterial disease.  He had palpable pedal pulses and triphasic signals in both feet.  On his CT scan he had no evidence of aortoiliac occlusive disease.  He did have some evidence of chronic venous insufficiency and his venous disease was followed by Dr. Aleda Grana.  We discussed the importance of leg elevation, exercise, and the need to avoid prolonged sitting and standing.  We also discussed the use of compression therapy.  He describes some paresthesias in his legs and I felt the only other consideration would be a neurology  consult or possible rheumatology consult if his symptoms worsen. ? ?On my history, he notes pain in the back of his thighs when he sitting.  He also notes some paresthesias in his legs when he is sitting.  When he elevates his legs he has discomfort and cramps in his calves where his calves are resting against the leg elevation device.  I do not get any history of claudication, rest pain, or nonhealing ulcers.  He does describe some aching pain in his legs with standing which is relieved with elevation.  Certainly the symptoms could be related to his venous insufficiency. ? ?Past Medical History:  ?Diagnosis Date  ? Arthritis   ? Atherosclerosis   ? Clotting disorder ([place])   ? blood clot/dvt  ? History of kidney stones   ? PAD (peripheral artery disease) ([place])   ? Varicose veins of both lower extremities   ? ? ?Family History  ?Problem Relation Age of Onset  ? Diabetes Father   ? Heart disease Father   ? Colon cancer Neg Hx   ? Esophageal cancer Neg Hx   ? Rectal cancer Neg Hx   ? Stomach cancer Neg Hx   ? ? ?SOCIAL HISTORY: ?Social History  ? ?Tobacco Use  ? Smoking status: Never  ? Smokeless tobacco: Never  ?Substance Use Topics  ? Alcohol use: Yes  ?  Alcohol/week: 1.0 standard drink  ?  Types: 1  Glasses of wine per week  ?  Comment: weekly  ? ? ?Allergies  ?Allergen Reactions  ? Ciprofloxacin   ?  Altered mental state  ? Nsaids Nausea And Vomiting  ?  Upset stomach   ? ? ?Current Outpatient Medications  ?Medication Sig Dispense Refill  ? aspirin EC 81 MG tablet Take 1 tablet (81 mg total) by mouth daily. Swallow whole. 90 tablet 3  ? Buprenorphine HCl-Naloxone HCl 8-2 MG FILM Place 1 film under the tongue and allow to dissolve once a day 28 each 2  ? buprenorphine-naloxone (SUBOXONE) 8-2 mg SUBL SL tablet Place 1 tablet under the tongue daily 28 tablet 1  ? buprenorphine-naloxone (SUBOXONE) 8-2 mg SUBL SL tablet Dissolve 1 tablet under the tongue once daily. 5 tablet 0  ? cyclobenzaprine (FLEXERIL) 10 MG tablet  Take 1 tablet (10 mg total) by mouth 3 (three) times daily as needed. 90 tablet 5  ? cyclobenzaprine (FLEXERIL) 10 MG tablet Take 1 tablet by mouth three times a day 90 tablet 5  ? morphine (MSIR) 15 MG tablet Take 1 tablet by mouth every 4 hours as needed for severe pain. 30 tablet 0  ? Multiple Vitamin (MULTIVITAMIN WITH MINERALS) TABS tablet Take 1 tablet by mouth daily.    ? omeprazole (PRILOSEC) 20 MG capsule Take 1 capsule by mouth daily. 30 capsule 1  ? ondansetron (ZOFRAN) 4 MG tablet Take 1 tablet by mouth daily as needed for nausea or vomiting. 30 tablet 1  ? ?No current facility-administered medications for this visit.  ? ? ?REVIEW OF SYSTEMS:  ?'[X]'$  denotes positive finding, '[ ]'$  denotes negative finding ?Cardiac  Comments:  ?Chest pain or chest pressure:    ?Shortness of breath upon exertion:    ?Short of breath when lying flat:    ?Irregular heart rhythm:    ?    ?Vascular    ?Pain in calf, thigh, or hip brought on by ambulation:    ?Pain in feet at night that wakes you up from your sleep:     ?Blood clot in your veins:    ?Leg swelling:     ?    ?Pulmonary    ?Oxygen at home:    ?Productive cough:     ?Wheezing:     ?    ?Neurologic    ?Sudden weakness in arms or legs:     ?Sudden numbness in arms or legs:     ?Sudden onset of difficulty speaking or slurred speech:    ?Temporary loss of vision in one eye:     ?Problems with dizziness:     ?    ?Gastrointestinal    ?Blood in stool:     ?Vomited blood:     ?    ?Genitourinary    ?Burning when urinating:     ?Blood in urine:    ?    ?Psychiatric    ?Major depression:     ?    ?Hematologic    ?Bleeding problems:    ?Problems with blood clotting too easily:    ?    ?Skin    ?Rashes or ulcers:    ?    ?Constitutional    ?Fever or chills:    ? ?PHYSICAL EXAM:  ? ?Vitals:  ? 10/25/21 1239  ?BP: 115/68  ?Pulse: (!) 59  ?Resp: 20  ?Temp: 98.1 ?F (36.7 ?C)  ?SpO2: 95%  ?Weight: 205 lb (93 kg)  ?Height: '6\' 3"'$  (1.905 m)  ? ? ?GENERAL:  The patient is a well-nourished  male, in no acute distress. The vital signs are documented above. ?CARDIAC: There is a regular rate and rhythm.  ?VASCULAR: I do not detect carotid bruits. ?He has palpable dorsalis pedis and posterior tibial pulses bilaterally. ?He has no significant lower extremity swelling. ?He has some telangiectasias in both lower extremities. ?PULMONARY: There is good air exchange bilaterally without wheezing or rales. ?ABDOMEN: Soft and non-tender with normal pitched bowel sounds.  I do not palpate an aneurysm. ?MUSCULOSKELETAL: There are no major deformities or cyanosis. ?NEUROLOGIC: No focal weakness or paresthesias are detected. ?SKIN: There are no ulcers or rashes noted. ?PSYCHIATRIC: The patient has a normal affect. ? ?DATA:   ? ?ARTERIAL DOPPLER STUDY: I have independently interpreted his arterial Doppler study today. ? ?On the right side, there is a triphasic dorsalis pedis and posterior tibial signal.  ABI is 100%.  Toe pressures 135 mmHg. ? ?On the left side there is a triphasic dorsalis pedis and posterior tibial signal.  ABIs 100%.  Toe pressures 117 mmHg. ? ?[name] ?[facility] ?Office [phone] ?

## 2021-10-26 ENCOUNTER — Ambulatory Visit (HOSPITAL_COMMUNITY): Payer: 59

## 2021-10-30 ENCOUNTER — Encounter (HOSPITAL_COMMUNITY): Payer: 59 | Admitting: Physical Therapy

## 2021-11-01 ENCOUNTER — Ambulatory Visit (HOSPITAL_COMMUNITY): Payer: 59

## 2021-11-02 ENCOUNTER — Encounter (HOSPITAL_COMMUNITY): Payer: 59

## 2021-11-05 ENCOUNTER — Ambulatory Visit (HOSPITAL_COMMUNITY): Payer: 59 | Admitting: Physical Therapy

## 2021-11-06 ENCOUNTER — Other Ambulatory Visit (HOSPITAL_COMMUNITY): Payer: Self-pay

## 2021-11-07 ENCOUNTER — Encounter (HOSPITAL_COMMUNITY): Payer: 59 | Admitting: Physical Therapy

## 2021-11-09 ENCOUNTER — Encounter (HOSPITAL_COMMUNITY): Payer: 59 | Admitting: Physical Therapy

## 2021-11-20 ENCOUNTER — Other Ambulatory Visit (HOSPITAL_COMMUNITY): Payer: Self-pay

## 2021-12-11 ENCOUNTER — Other Ambulatory Visit (HOSPITAL_COMMUNITY): Payer: Self-pay

## 2021-12-26 ENCOUNTER — Other Ambulatory Visit (HOSPITAL_COMMUNITY): Payer: Self-pay

## 2021-12-26 DIAGNOSIS — Z79891 Long term (current) use of opiate analgesic: Secondary | ICD-10-CM | POA: Diagnosis not present

## 2021-12-26 DIAGNOSIS — G894 Chronic pain syndrome: Secondary | ICD-10-CM | POA: Diagnosis not present

## 2021-12-26 DIAGNOSIS — F1129 Opioid dependence with unspecified opioid-induced disorder: Secondary | ICD-10-CM | POA: Diagnosis not present

## 2021-12-26 DIAGNOSIS — M79669 Pain in unspecified lower leg: Secondary | ICD-10-CM | POA: Diagnosis not present

## 2021-12-26 DIAGNOSIS — M545 Low back pain, unspecified: Secondary | ICD-10-CM | POA: Diagnosis not present

## 2021-12-26 MED ORDER — BUPRENORPHINE HCL-NALOXONE HCL 8-2 MG SL SUBL
SUBLINGUAL_TABLET | SUBLINGUAL | 0 refills | Status: DC
  Filled 2021-12-26: qty 28, 28d supply, fill #0

## 2021-12-26 MED ORDER — BUPRENORPHINE HCL-NALOXONE HCL 8-2 MG SL SUBL
1.0000 | SUBLINGUAL_TABLET | Freq: Every day | SUBLINGUAL | 2 refills | Status: DC
  Filled 2021-12-26 – 2022-03-01 (×2): qty 28, 28d supply, fill #0

## 2021-12-27 ENCOUNTER — Other Ambulatory Visit (HOSPITAL_COMMUNITY): Payer: Self-pay

## 2022-01-04 ENCOUNTER — Telehealth: Payer: Self-pay

## 2022-01-04 NOTE — Telephone Encounter (Signed)
Pt left voice message saying he injured his left calf when a chain flew off of his lawnmower and hit him.  Feels it Venous insuffiency symptoms worse.  Pt said he will try to send my chart pics of his leg.  Per Gwenette Greet, patient will need repeat reflux ultrasound and an office visit.

## 2022-01-10 ENCOUNTER — Other Ambulatory Visit (HOSPITAL_COMMUNITY): Payer: Self-pay

## 2022-01-15 ENCOUNTER — Other Ambulatory Visit: Payer: Self-pay | Admitting: *Deleted

## 2022-01-15 DIAGNOSIS — I872 Venous insufficiency (chronic) (peripheral): Secondary | ICD-10-CM

## 2022-01-24 ENCOUNTER — Other Ambulatory Visit (HOSPITAL_COMMUNITY): Payer: Self-pay

## 2022-01-24 ENCOUNTER — Ambulatory Visit (INDEPENDENT_AMBULATORY_CARE_PROVIDER_SITE_OTHER): Payer: 59 | Admitting: Vascular Surgery

## 2022-01-24 ENCOUNTER — Encounter: Payer: Self-pay | Admitting: Vascular Surgery

## 2022-01-24 ENCOUNTER — Ambulatory Visit (HOSPITAL_COMMUNITY)
Admission: RE | Admit: 2022-01-24 | Discharge: 2022-01-24 | Disposition: A | Discharge status: Home / Self Care | Payer: 59 | Source: Ambulatory Visit | Attending: Vascular Surgery | Admitting: Vascular Surgery

## 2022-01-24 VITALS — BP 114/73 | HR 70 | Temp 98.7°F | Resp 20 | Ht 75.0 in | Wt 199.0 lb

## 2022-01-24 DIAGNOSIS — I872 Venous insufficiency (chronic) (peripheral): Secondary | ICD-10-CM | POA: Insufficient documentation

## 2022-01-24 NOTE — Progress Notes (Signed)
Patient name: Paul Sawyer MRN: [id number] DOB: 15-May-1971 Sex: male  REASON FOR VISIT:   Injury to left leg  HPI:   Paul Sawyer is a pleasant 51 y.o. male who I last saw on 10/25/2021 with leg pain.  He had multiple complaints referable to his legs.  He describes some cramping in the legs and also pressure in the back of his thighs when sitting in the back of his calves when they were elevated.  When I saw him last he had no evidence of arterial insufficiency.  He had palpable pedal pulses and normal Doppler signals and normal toe pressures.  Likewise I did not think he had symptoms attributable to chronic venous insufficiency.  He does have a history of lower back pain and disc problems certainly some of the pain could be related to this.  The only other consideration I had was to see a rheumatologist to look for some underlying musculoskeletal condition that might explain his symptoms.  He comes in today because he sustained an injury to his left leg.  He was cutting the grass and a dog leash got caught on the propeller and wrapped around his left leg and he sustained a significant bruise and injury.  He has had previous ablation elsewhere and wanted to be sure there were no changes in his venous ablation procedure.  He was added onto the schedule.  The wound has been healing.  He denies any claudication or rest pain.  He has been wearing compression stockings and elevating his legs.  Current Outpatient Medications  Medication Sig Dispense Refill   aspirin EC 81 MG tablet Take 1 tablet (81 mg total) by mouth daily. Swallow whole. 90 tablet 3   Buprenorphine HCl-Naloxone HCl 8-2 MG FILM Place 1 film under the tongue and allow to dissolve once a day 28 each 2   buprenorphine-naloxone (SUBOXONE) 8-2 mg SUBL SL tablet Place 1 tablet under the tongue daily 28 tablet 1   buprenorphine-naloxone (SUBOXONE) 8-2 mg SUBL SL tablet Place 1 tablet under the tongue daily. 28 tablet 2    buprenorphine-naloxone (SUBOXONE) 8-2 mg SUBL SL tablet Dissolve 1 tablet by mouth daily 28 tablet 0   cyclobenzaprine (FLEXERIL) 10 MG tablet Take 1 tablet (10 mg total) by mouth 3 (three) times daily as needed. 90 tablet 5   cyclobenzaprine (FLEXERIL) 10 MG tablet Take 1 tablet by mouth 3 times daily as needed. 90 tablet 5   morphine (MSIR) 15 MG tablet Take 1 tablet by mouth every 4 hours as needed for severe pain. 30 tablet 0   Multiple Vitamin (MULTIVITAMIN WITH MINERALS) TABS tablet Take 1 tablet by mouth daily.     omeprazole (PRILOSEC) 20 MG capsule Take 1 capsule by mouth daily. 30 capsule 1   ondansetron (ZOFRAN) 4 MG tablet Take 1 tablet by mouth daily as needed for nausea or vomiting. 30 tablet 1   No current facility-administered medications for this visit.    REVIEW OF SYSTEMS:  '[X]'$  denotes positive finding, '[ ]'$  denotes negative finding Vascular    Leg swelling    Cardiac    Chest pain or chest pressure:    Shortness of breath upon exertion:    Short of breath when lying flat:    Irregular heart rhythm:    Constitutional    Fever or chills:     PHYSICAL EXAM:   Vitals:   01/24/22 1349  BP: 114/73  Pulse: 70  Resp: 20  Temp: 98.7  F (37.1 C)  SpO2: 98%  Weight: 199 lb (90.3 kg)  Height: '6\' 3"'$  (1.905 m)    GENERAL: The patient is a well-nourished male, in no acute distress. The vital signs are documented above. CARDIOVASCULAR: There is a regular rate and rhythm. PULMONARY: There is good air exchange bilaterally without wheezing or rales. VASCULAR: He has palpable posterior tibial pulses bilaterally.  He has a biphasic dorsalis pedis and posterior tibial signal bilaterally. He has some mild induration on his medial left leg where he had the injury.  DATA:   VENOUS DUPLEX: I have independently interpreted his venous duplex scan today.  This shows no evidence of DVT in the left lower extremity.  The left great saphenous vein has been previously ablated.  The left  small saphenous vein has reflux.  There is no deep venous reflux on the left.  MEDICAL ISSUES:   CHRONIC VENOUS INSUFFICIENCY: The patient does have a history of chronic venous insufficiency and is undergone previous ablation of the left great saphenous vein elsewhere.  On his duplex scan today there is no deep venous reflux.  There is no reflux in the superficial system in the small saphenous vein.  He has no evidence of arterial insufficiency.  I think the wound on his left leg should heal without any problem.  He should continue to elevate his legs daily and wear his compression stockings.  I will see him back as needed.  [name] [facility] [phone]

## 2022-01-25 ENCOUNTER — Other Ambulatory Visit (HOSPITAL_COMMUNITY): Payer: Self-pay

## 2022-02-07 ENCOUNTER — Other Ambulatory Visit (HOSPITAL_COMMUNITY): Payer: Self-pay

## 2022-03-01 ENCOUNTER — Other Ambulatory Visit (HOSPITAL_COMMUNITY): Payer: Self-pay

## 2022-03-05 ENCOUNTER — Other Ambulatory Visit (HOSPITAL_COMMUNITY): Payer: Self-pay

## 2022-03-20 ENCOUNTER — Other Ambulatory Visit (HOSPITAL_COMMUNITY): Payer: Self-pay

## 2022-03-20 DIAGNOSIS — M79669 Pain in unspecified lower leg: Secondary | ICD-10-CM | POA: Diagnosis not present

## 2022-03-20 DIAGNOSIS — M545 Low back pain, unspecified: Secondary | ICD-10-CM | POA: Diagnosis not present

## 2022-03-20 DIAGNOSIS — M5459 Other low back pain: Secondary | ICD-10-CM | POA: Diagnosis not present

## 2022-03-20 DIAGNOSIS — F1129 Opioid dependence with unspecified opioid-induced disorder: Secondary | ICD-10-CM | POA: Diagnosis not present

## 2022-03-20 DIAGNOSIS — G894 Chronic pain syndrome: Secondary | ICD-10-CM | POA: Diagnosis not present

## 2022-03-20 DIAGNOSIS — Z79891 Long term (current) use of opiate analgesic: Secondary | ICD-10-CM | POA: Diagnosis not present

## 2022-03-20 MED ORDER — BUPRENORPHINE HCL-NALOXONE HCL 8-2 MG SL SUBL
SUBLINGUAL_TABLET | SUBLINGUAL | 2 refills | Status: DC
  Filled 2022-03-20 – 2022-03-26 (×2): qty 28, 28d supply, fill #0
  Filled 2022-04-22 – 2022-04-23 (×3): qty 28, 28d supply, fill #1
  Filled 2022-05-28: qty 28, 28d supply, fill #2

## 2022-03-22 ENCOUNTER — Other Ambulatory Visit (HOSPITAL_COMMUNITY): Payer: Self-pay

## 2022-03-23 ENCOUNTER — Other Ambulatory Visit (HOSPITAL_COMMUNITY): Payer: Self-pay

## 2022-03-26 ENCOUNTER — Other Ambulatory Visit (HOSPITAL_COMMUNITY): Payer: Self-pay

## 2022-04-16 ENCOUNTER — Other Ambulatory Visit (HOSPITAL_COMMUNITY): Payer: Self-pay

## 2022-04-16 MED ORDER — CYCLOBENZAPRINE HCL 10 MG PO TABS
10.0000 mg | ORAL_TABLET | Freq: Three times a day (TID) | ORAL | 10 refills | Status: DC | PRN
  Filled 2022-04-16: qty 90, 30d supply, fill #0
  Filled 2022-05-14: qty 90, 30d supply, fill #1
  Filled 2022-06-19: qty 90, 30d supply, fill #2
  Filled 2022-07-24: qty 90, 30d supply, fill #3
  Filled 2022-08-28: qty 90, 30d supply, fill #4
  Filled 2022-09-23: qty 90, 30d supply, fill #5
  Filled 2022-10-29: qty 90, 30d supply, fill #6
  Filled 2022-11-28: qty 90, 30d supply, fill #7
  Filled 2022-12-26 – 2023-03-14 (×2): qty 90, 30d supply, fill #8

## 2022-04-17 ENCOUNTER — Other Ambulatory Visit (HOSPITAL_COMMUNITY): Payer: Self-pay

## 2022-04-22 ENCOUNTER — Other Ambulatory Visit (HOSPITAL_COMMUNITY): Payer: Self-pay

## 2022-04-23 ENCOUNTER — Other Ambulatory Visit (HOSPITAL_COMMUNITY): Payer: Self-pay

## 2022-04-26 DIAGNOSIS — I872 Venous insufficiency (chronic) (peripheral): Secondary | ICD-10-CM | POA: Diagnosis not present

## 2022-04-26 DIAGNOSIS — M5136 Other intervertebral disc degeneration, lumbar region: Secondary | ICD-10-CM | POA: Diagnosis not present

## 2022-04-26 DIAGNOSIS — M170 Bilateral primary osteoarthritis of knee: Secondary | ICD-10-CM | POA: Diagnosis not present

## 2022-05-14 ENCOUNTER — Other Ambulatory Visit (HOSPITAL_COMMUNITY): Payer: Self-pay

## 2022-05-28 ENCOUNTER — Other Ambulatory Visit (HOSPITAL_COMMUNITY): Payer: Self-pay

## 2022-06-19 ENCOUNTER — Other Ambulatory Visit (HOSPITAL_COMMUNITY): Payer: Self-pay

## 2022-06-19 DIAGNOSIS — F1129 Opioid dependence with unspecified opioid-induced disorder: Secondary | ICD-10-CM | POA: Diagnosis not present

## 2022-06-19 DIAGNOSIS — G894 Chronic pain syndrome: Secondary | ICD-10-CM | POA: Diagnosis not present

## 2022-06-19 DIAGNOSIS — M79669 Pain in unspecified lower leg: Secondary | ICD-10-CM | POA: Diagnosis not present

## 2022-06-19 DIAGNOSIS — M545 Low back pain, unspecified: Secondary | ICD-10-CM | POA: Diagnosis not present

## 2022-06-20 ENCOUNTER — Other Ambulatory Visit (HOSPITAL_COMMUNITY): Payer: Self-pay

## 2022-06-20 MED ORDER — BUPRENORPHINE HCL-NALOXONE HCL 8-2 MG SL SUBL
1.0000 | SUBLINGUAL_TABLET | Freq: Every day | SUBLINGUAL | 2 refills | Status: DC
  Filled 2022-06-20 – 2022-06-26 (×3): qty 28, 28d supply, fill #0
  Filled 2022-07-22: qty 28, 28d supply, fill #1
  Filled 2022-09-18: qty 28, 28d supply, fill #2

## 2022-06-21 ENCOUNTER — Other Ambulatory Visit (HOSPITAL_COMMUNITY): Payer: Self-pay

## 2022-06-26 ENCOUNTER — Other Ambulatory Visit (HOSPITAL_COMMUNITY): Payer: Self-pay

## 2022-06-27 ENCOUNTER — Other Ambulatory Visit (HOSPITAL_COMMUNITY): Payer: Self-pay

## 2022-07-19 ENCOUNTER — Other Ambulatory Visit (HOSPITAL_COMMUNITY): Payer: Self-pay

## 2022-07-22 ENCOUNTER — Other Ambulatory Visit (HOSPITAL_COMMUNITY): Payer: Self-pay

## 2022-07-24 ENCOUNTER — Other Ambulatory Visit (HOSPITAL_COMMUNITY): Payer: Self-pay

## 2022-08-28 ENCOUNTER — Other Ambulatory Visit (HOSPITAL_COMMUNITY): Payer: Self-pay

## 2022-09-13 DIAGNOSIS — M79669 Pain in unspecified lower leg: Secondary | ICD-10-CM | POA: Diagnosis not present

## 2022-09-13 DIAGNOSIS — Z79891 Long term (current) use of opiate analgesic: Secondary | ICD-10-CM | POA: Diagnosis not present

## 2022-09-13 DIAGNOSIS — G894 Chronic pain syndrome: Secondary | ICD-10-CM | POA: Diagnosis not present

## 2022-09-13 DIAGNOSIS — M545 Low back pain, unspecified: Secondary | ICD-10-CM | POA: Diagnosis not present

## 2022-09-18 ENCOUNTER — Other Ambulatory Visit (HOSPITAL_COMMUNITY): Payer: Self-pay

## 2022-09-18 ENCOUNTER — Other Ambulatory Visit: Payer: Self-pay

## 2022-09-23 ENCOUNTER — Other Ambulatory Visit (HOSPITAL_COMMUNITY): Payer: Self-pay

## 2022-10-01 ENCOUNTER — Other Ambulatory Visit: Payer: Self-pay

## 2022-10-08 ENCOUNTER — Other Ambulatory Visit (HOSPITAL_COMMUNITY): Payer: Self-pay

## 2022-10-23 ENCOUNTER — Other Ambulatory Visit (HOSPITAL_COMMUNITY): Payer: Self-pay

## 2022-10-27 IMAGING — US US EXTREM LOW VENOUS
1 series · 13 of 24 positions shown · non-contrast
Comparison: None.

CLINICAL DATA: Bilateral leg pain and edema



[Series 1: us venous img lower bilat (dvt) · portal-venous · 13 of 68 slices shown]
[im 1/68]
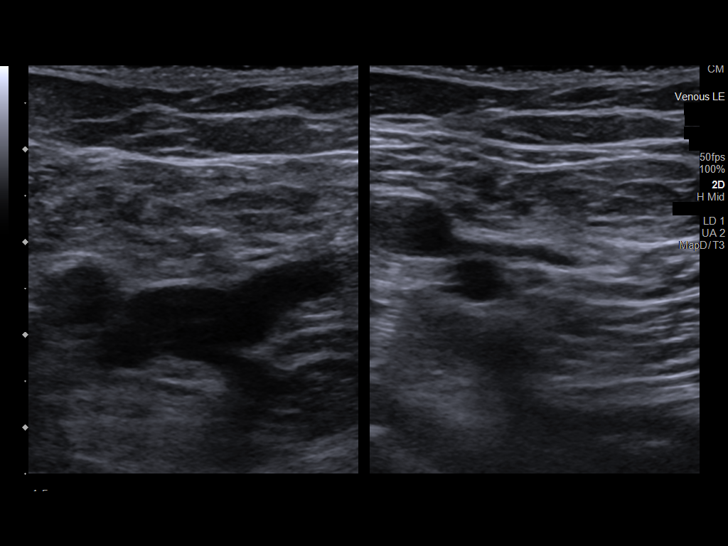
[im 6/68]
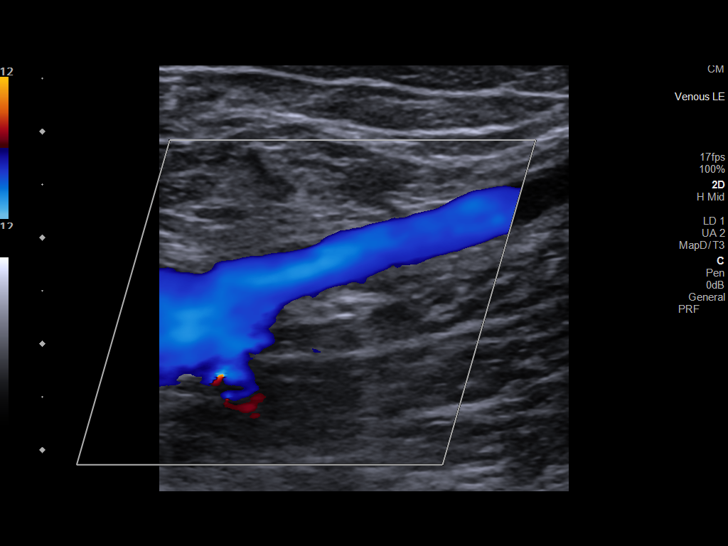
[im 12/68]
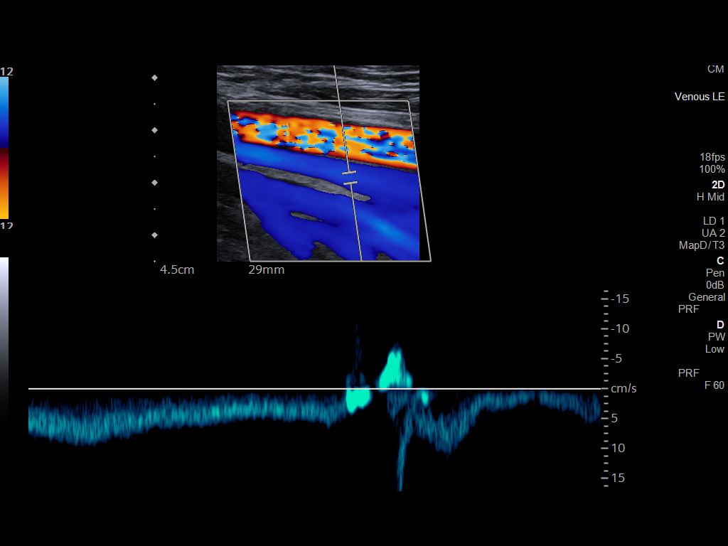
[im 18/68]
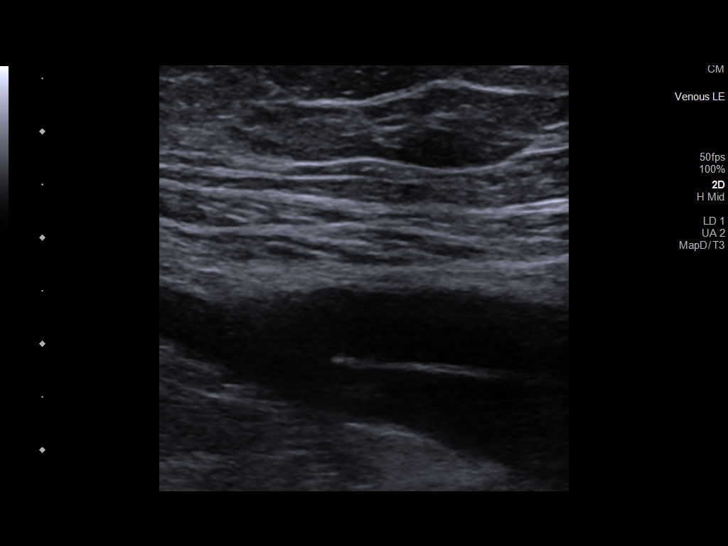
[im 24/68]
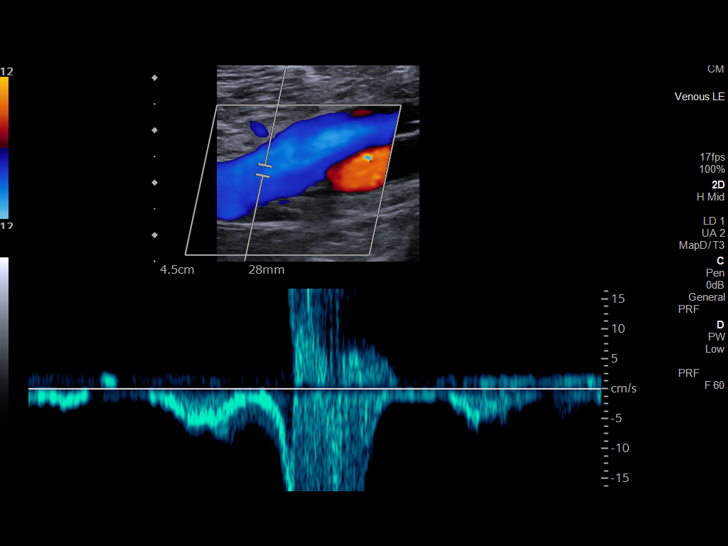
[im 30/68]
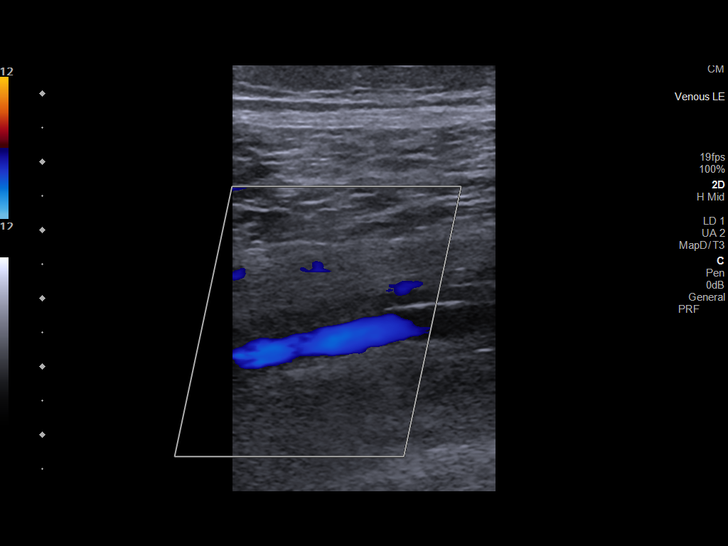
[im 35/68]
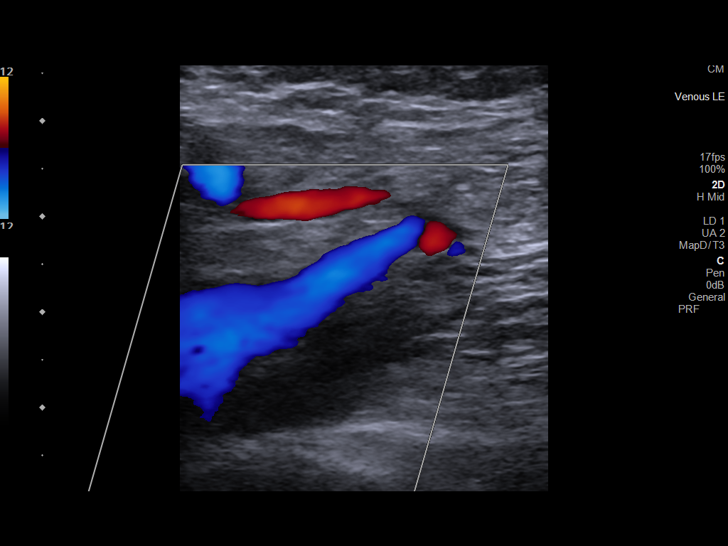
[im 38/68]
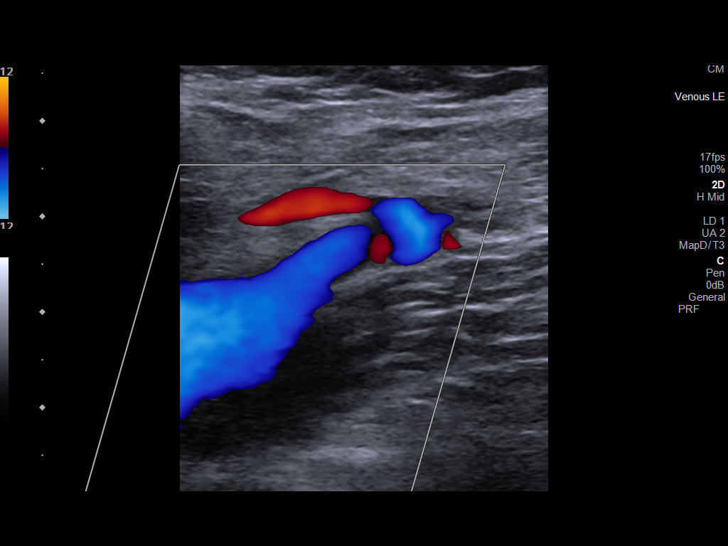
[im 44/68]
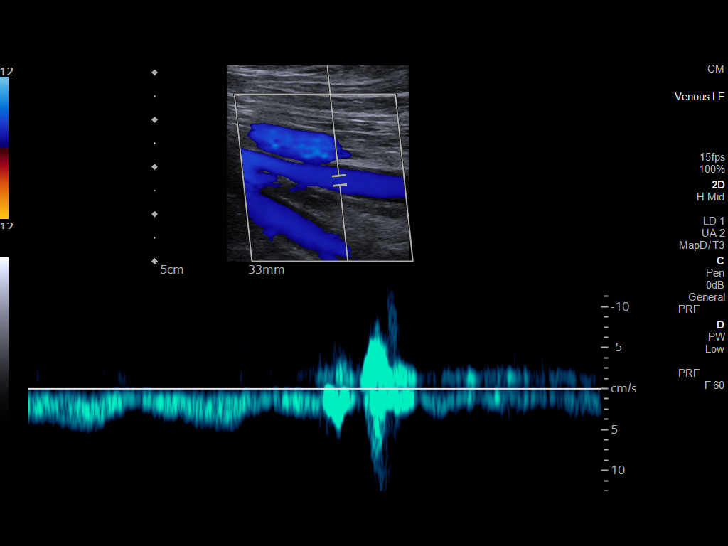
[im 50/68]
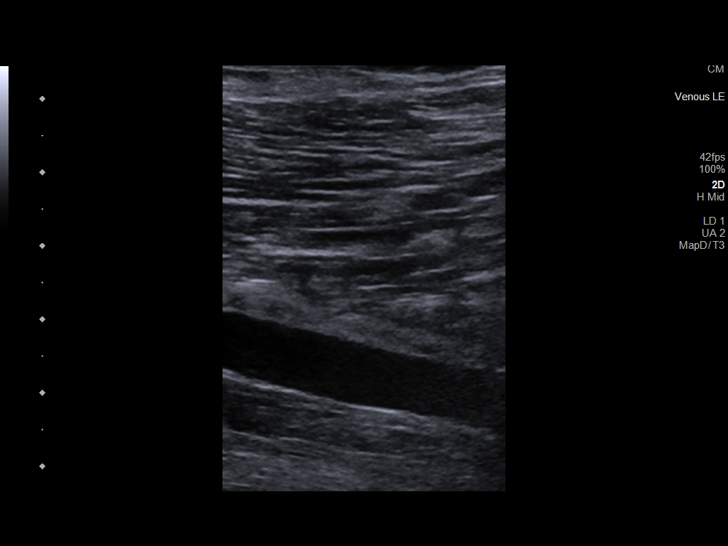
[im 56/68]
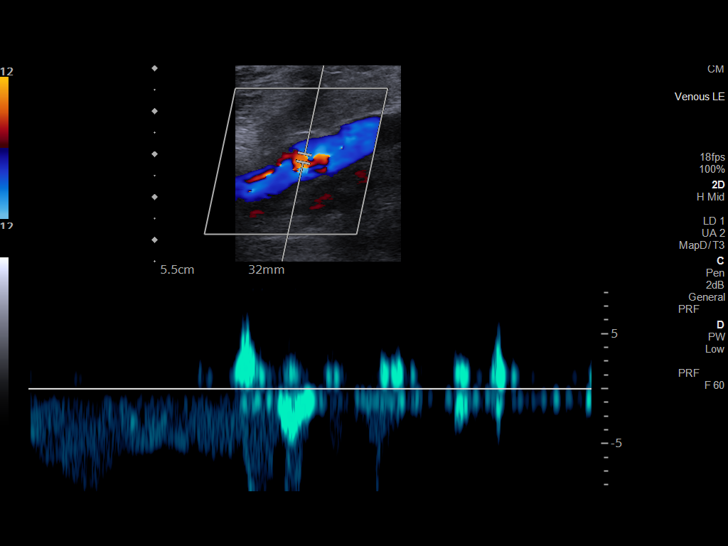
[im 62/68]
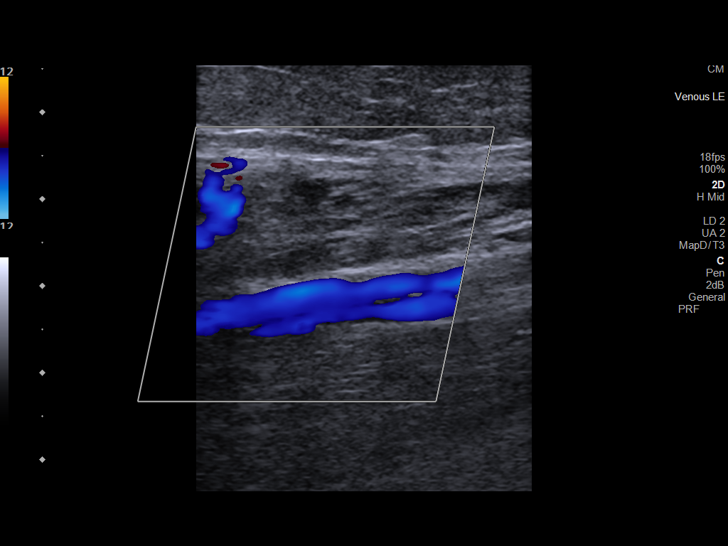
[im 68/68]
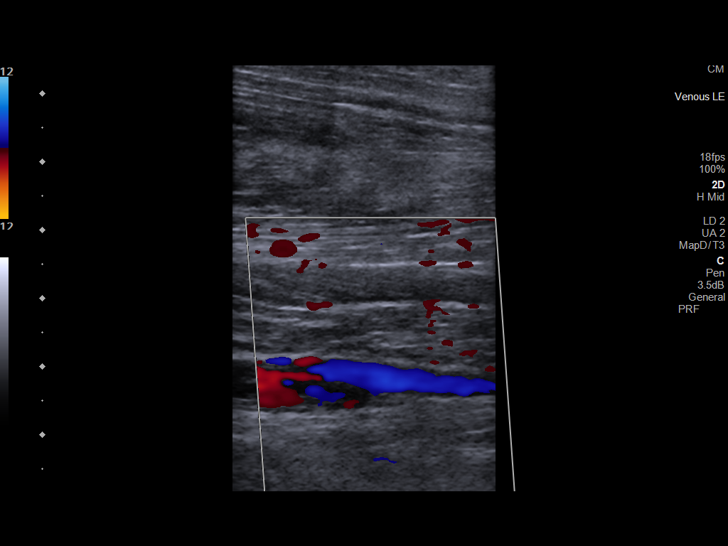

[13 of 24 positions shown; findings below may reference images not displayed]

FINDINGS: RIGHT LOWER EXTREMITY

Common Femoral Vein: No evidence of thrombus. Normal
compressibility, respiratory phasicity and response to augmentation.

Saphenofemoral Junction: No evidence of thrombus. Normal
compressibility and flow on color Doppler imaging.

Profunda Femoral Vein: No evidence of thrombus. Normal
compressibility and flow on color Doppler imaging.

Femoral Vein: No evidence of thrombus. Normal compressibility,
respiratory phasicity and response to augmentation.

Popliteal Vein: No evidence of thrombus. Normal compressibility,
respiratory phasicity and response to augmentation.

Calf Veins: No evidence of thrombus. Normal compressibility and flow
on color Doppler imaging.

LEFT LOWER EXTREMITY

Common Femoral Vein: No evidence of thrombus. Normal
compressibility, respiratory phasicity and response to augmentation.

Saphenofemoral Junction: No evidence of thrombus. Normal
compressibility and flow on color Doppler imaging.

Profunda Femoral Vein: No evidence of thrombus. Normal
compressibility and flow on color Doppler imaging.

Femoral Vein: No evidence of thrombus. Normal compressibility,
respiratory phasicity and response to augmentation.

Popliteal Vein: No evidence of thrombus. Normal compressibility,
respiratory phasicity and response to augmentation.

Calf Veins: No evidence of thrombus. Normal compressibility and flow
on color Doppler imaging.
IMPRESSION: No evidence of deep venous thrombosis in either lower extremity.

## 2022-10-29 ENCOUNTER — Other Ambulatory Visit (HOSPITAL_COMMUNITY): Payer: Self-pay

## 2022-10-29 ENCOUNTER — Other Ambulatory Visit: Payer: Self-pay

## 2022-10-31 ENCOUNTER — Other Ambulatory Visit: Payer: Self-pay

## 2022-10-31 ENCOUNTER — Other Ambulatory Visit (HOSPITAL_COMMUNITY): Payer: Self-pay

## 2022-10-31 MED ORDER — BUPRENORPHINE HCL-NALOXONE HCL 8-2 MG SL SUBL
0.5000 | SUBLINGUAL_TABLET | Freq: Every day | SUBLINGUAL | 1 refills | Status: DC
  Filled 2022-10-31: qty 15, 30d supply, fill #0
  Filled 2022-11-28: qty 15, 30d supply, fill #1

## 2022-11-05 ENCOUNTER — Telehealth: Payer: Self-pay | Admitting: Cardiology

## 2022-11-05 NOTE — Telephone Encounter (Signed)
OK with me Chris  

## 2022-11-05 NOTE — Telephone Encounter (Signed)
Would like to switch to a [address].

## 2022-11-13 ENCOUNTER — Other Ambulatory Visit (HOSPITAL_COMMUNITY): Payer: Self-pay | Admitting: Gerontology

## 2022-11-13 DIAGNOSIS — M25561 Pain in right knee: Secondary | ICD-10-CM

## 2022-11-14 ENCOUNTER — Ambulatory Visit (HOSPITAL_COMMUNITY)
Admission: RE | Admit: 2022-11-14 | Discharge: 2022-11-14 | Disposition: A | Discharge status: Home / Self Care | Payer: 59 | Source: Ambulatory Visit | Attending: Gerontology | Admitting: Gerontology

## 2022-11-14 DIAGNOSIS — M1711 Unilateral primary osteoarthritis, right knee: Secondary | ICD-10-CM | POA: Diagnosis not present

## 2022-11-14 DIAGNOSIS — M25561 Pain in right knee: Secondary | ICD-10-CM | POA: Diagnosis not present

## 2022-11-14 DIAGNOSIS — M25562 Pain in left knee: Secondary | ICD-10-CM | POA: Diagnosis not present

## 2022-11-14 DIAGNOSIS — M1712 Unilateral primary osteoarthritis, left knee: Secondary | ICD-10-CM | POA: Diagnosis not present

## 2022-11-15 DIAGNOSIS — M5136 Other intervertebral disc degeneration, lumbar region: Secondary | ICD-10-CM | POA: Diagnosis not present

## 2022-11-15 DIAGNOSIS — I872 Venous insufficiency (chronic) (peripheral): Secondary | ICD-10-CM | POA: Diagnosis not present

## 2022-11-15 DIAGNOSIS — Z0001 Encounter for general adult medical examination with abnormal findings: Secondary | ICD-10-CM | POA: Diagnosis not present

## 2022-11-15 DIAGNOSIS — M170 Bilateral primary osteoarthritis of knee: Secondary | ICD-10-CM | POA: Diagnosis not present

## 2022-11-15 DIAGNOSIS — I708 Atherosclerosis of other arteries: Secondary | ICD-10-CM | POA: Diagnosis not present

## 2022-11-26 ENCOUNTER — Other Ambulatory Visit (HOSPITAL_COMMUNITY): Payer: Self-pay

## 2022-12-04 ENCOUNTER — Ambulatory Visit: Payer: 59 | Admitting: Internal Medicine

## 2022-12-09 ENCOUNTER — Ambulatory Visit: Payer: Commercial Managed Care - PPO | Admitting: Cardiology

## 2022-12-13 ENCOUNTER — Ambulatory Visit: Payer: 59 | Attending: Cardiology | Admitting: Internal Medicine

## 2022-12-13 ENCOUNTER — Encounter: Payer: Self-pay | Admitting: Internal Medicine

## 2022-12-13 VITALS — BP 128/80 | HR 58 | Ht 75.0 in | Wt 204.2 lb

## 2022-12-13 DIAGNOSIS — I2510 Atherosclerotic heart disease of native coronary artery without angina pectoris: Secondary | ICD-10-CM | POA: Diagnosis not present

## 2022-12-13 DIAGNOSIS — R252 Cramp and spasm: Secondary | ICD-10-CM

## 2022-12-13 DIAGNOSIS — R230 Cyanosis: Secondary | ICD-10-CM

## 2022-12-13 NOTE — Patient Instructions (Addendum)
Medication Instructions:  Your physician recommends that you continue on your current medications as directed. Please refer to the Current Medication list given to you today.  *If you need a refill on your cardiac medications before your next appointment, please call your pharmacy*   Lab Work: None If you have labs (blood work) drawn today and your tests are completely normal, you will receive your results only by: MyChart Message (if you have MyChart) OR A paper copy in the mail If you have any lab test that is abnormal or we need to change your treatment, we will call you to review the results.   Testing/Procedures: None   Follow-Up: At [facility], you and your health needs are our priority.  As part of our continuing mission to provide you with exceptional heart care, we have created designated Provider Care Teams.  These Care Teams include your primary Cardiologist (physician) and Advanced Practice Providers (APPs -  Physician Assistants and Nurse Practitioners) who all work together to provide you with the care you need, when you need it.  We recommend signing up for the patient portal called "MyChart".  Sign up information is provided on this After Visit Summary.  MyChart is used to connect with patients for Virtual Visits (Telemedicine).  Patients are able to view lab/test results, encounter notes, upcoming appointments, etc.  Non-urgent messages can be sent to your provider as well.   To learn more about what you can do with MyChart, go to [email or web address].    Your next appointment:   2 year(s)  Provider:   Vishnu Mallipeddi, MD    Other Instructions    

## 2022-12-13 NOTE — Progress Notes (Signed)
Cardiology Office Note  Date: 12/13/2022   ID: [name], DOB [birth year], MRN [id number]  PCP:  Benetta Spar, MD  Cardiologist:  Marjo Bicker, MD Electrophysiologist:  None   Reason for Office Visit: Follow-up of muscle cramps in the legs   History of Present Illness: Paul Sawyer is a 52 y.o. male known to have mild CAD, history of DVT, gastric bypass, chronic venous insufficiency s/p L GSV ablation is here for follow-up visit.  Patient was initially referred to cardiology clinic in 2022 for evaluation of chest pain. He underwent CT cardiac that showed coronary calcium score of 808 (99th percentile for age, sex and race matched controls) but has mild nonobstructive CAD based on FFR.  Echocardiogram showed normal LVEF and no valve abnormalities.  He is here for follow-up visit.  He complains of cramps in his bilateral legs when he exerts himself.  He had normal ABI, normal toe pressures, normal pedal pulses and normal USG arterial Doppler bilateral lower extremities. He also underwent CTA runoff of the legs that showed no significant stenosis in the lower extremity vessels. He does have mild atherosclerosis.  He continues to have chest pain but thinks this could be from his scoliosis/kyphosis or from history of gastric bypass.  Otherwise denies other symptoms of DOE, palpitations, syncope.  He said he has leg swelling and hence wearing the compression socks.  He showed me the pictures of his blue feet.  Past Medical History:  Diagnosis Date   Arthritis    Atherosclerosis    Clotting disorder ([facility])    blood clot/dvt   History of kidney stones    PAD (peripheral artery disease) ([facility])    Varicose veins of both lower extremities     Past Surgical History:  Procedure Laterality Date   CHOLECYSTECTOMY  2004   EXTRACORPOREAL SHOCK WAVE LITHOTRIPSY Left 08/08/2020   Procedure: EXTRACORPOREAL SHOCK WAVE LITHOTRIPSY (ESWL);  Surgeon: Malen Gauze, MD;   Location: [facility];  Service: Urology;  Laterality: Left;   EXTRACORPOREAL SHOCK WAVE LITHOTRIPSY Left 08/21/2021   Procedure: EXTRACORPOREAL SHOCK WAVE LITHOTRIPSY (ESWL);  Surgeon: Malen Gauze, MD;  Location: [facility];  Service: Urology;  Laterality: Left;   GASTRIC BYPASS  2005    Current Outpatient Medications  Medication Sig Dispense Refill   buprenorphine-naloxone (SUBOXONE) 8-2 mg SUBL SL tablet Place 1 tablet under the tongue daily 28 tablet 1   cyclobenzaprine (FLEXERIL) 10 MG tablet Take 1 tablet (10 mg total) by mouth 3 (three) times daily as needed. 90 tablet 5   Multiple Vitamin (MULTIVITAMIN WITH MINERALS) TABS tablet Take 1 tablet by mouth daily.     omeprazole (PRILOSEC) 20 MG capsule Take 1 capsule by mouth daily. 30 capsule 1   aspirin EC 81 MG tablet Take 1 tablet (81 mg total) by mouth daily. Swallow whole. (Patient not taking: Reported on 12/13/2022) 90 tablet 3   Buprenorphine HCl-Naloxone HCl 8-2 MG FILM Place 1 film under the tongue and allow to dissolve once a day (Patient not taking: Reported on 12/13/2022) 28 each 2   buprenorphine-naloxone (SUBOXONE) 8-2 mg SUBL SL tablet Place 1 tablet under the tongue daily. (Patient not taking: Reported on 12/13/2022) 28 tablet 2   buprenorphine-naloxone (SUBOXONE) 8-2 mg SUBL SL tablet Dissolve 1 tablet by mouth daily (Patient not taking: Reported on 12/13/2022) 28 tablet 0   buprenorphine-naloxone (SUBOXONE) 8-2 mg SUBL SL tablet Dissolve one tablet under the tongue daily. (Patient not taking: Reported on 12/13/2022)  28 tablet 2   buprenorphine-naloxone (SUBOXONE) 8-2 mg SUBL SL tablet Place 1 tablet under the tongue daily. (Patient not taking: Reported on 12/13/2022) 28 tablet 2   buprenorphine-naloxone (SUBOXONE) 8-2 mg SUBL SL tablet Place 0.5 tablets under the tongue daily. (Patient not taking: Reported on 12/13/2022) 15 tablet 1   cyclobenzaprine (FLEXERIL) 10 MG tablet Take 1 tablet by mouth 3 times daily as needed. (Patient not taking:  Reported on 12/13/2022) 90 tablet 10   morphine (MSIR) 15 MG tablet Take 1 tablet by mouth every 4 hours as needed for severe pain. (Patient not taking: Reported on 12/13/2022) 30 tablet 0   No current facility-administered medications for this visit.   Allergies:  Ciprofloxacin and Nsaids   Social History: The patient  reports that he has never smoked. He has never used smokeless tobacco. He reports current alcohol use of about 1.0 standard drink of alcohol per week. He reports that he does not use drugs.   Family History: The patient's family history includes Diabetes in his father; Heart disease in his father.   ROS:  Please see the history of present illness. Otherwise, complete review of systems is positive for none.  All other systems are reviewed and negative.   Physical Exam: VS:  BP 128/80   Pulse (!) 58   Ht 6\' 3"  (1.905 m)   Wt 204 lb 3.2 oz (92.6 kg)   SpO2 100%   BMI 25.52 kg/m , BMI Body mass index is 25.52 kg/m.  Wt Readings from Last 3 Encounters:  12/13/22 204 lb 3.2 oz (92.6 kg)  01/24/22 199 lb (90.3 kg)  10/25/21 205 lb (93 kg)    General: Patient appears comfortable at rest. HEENT: Conjunctiva and lids normal, oropharynx clear with moist mucosa. Neck: Supple, no elevated JVP or carotid bruits, no thyromegaly. Lungs: Clear to auscultation, nonlabored breathing at rest. Cardiac: Regular rate and rhythm, no S3 or significant systolic murmur, no pericardial rub. Abdomen: Soft, nontender, no hepatomegaly, bowel sounds present, no guarding or rebound. Extremities: No pitting edema, distal pulses 2+. Skin: Warm and dry. Musculoskeletal: No kyphosis. Neuropsychiatric: Alert and oriented x3, affect grossly appropriate.  Recent Labwork: No results found for requested labs within last 365 days.  No results found for: "CHOL", "TRIG", "HDL", "CHOLHDL", "VLDL", "LDLCALC", "LDLDIRECT"  Other Studies Reviewed Today: Echocardiogram in 06/2021 At normal LVEF No valve  abnormalities  Assessment and Plan: Patient is a 52 year old M  known to have mild CAD, history of DVT, gastric bypass, chronic venous insufficiency s/p L GSV ablation is here for follow-up visit.  # Chronic venous insufficiency s/p L GSV ablation elsewhere # Muscle cramps in bilateral lower extremities # Blue discoloration of feet, intermittent -He had normal ABI, normal toe pressures, normal USG arterial Doppler bilateral lower extremities and normal CTA runoff of the legs that showed no significant stenosis in the lower extremity vessels. I do not think his symptoms are related to lower extremity vein or arterial issue.  Instructed patient to follow-up with rheumatology to rule out Raynaud's or small vessel disease.  # Mild nonobstructive CAD: Ideally should be on moderate intensity statin but due to muscle cramps I do not think he will take it.  I do not have a recent lipid panel to review his LDL or TG results. Instructed him to start with the basic labs with his PCP.   I have spent a total of 45 minutes with patient reviewing chart, EKGs, labs and examining patient as well as  establishing an assessment and plan that was discussed with the patient.  > 50% of time was spent in direct patient care.    Medication Adjustments/Labs and Tests Ordered: Current medicines are reviewed at length with the patient today.  Concerns regarding medicines are outlined above.   Tests Ordered: Orders Placed This Encounter  Procedures   EKG 12-Lead    Medication Changes: No orders of the defined types were placed in this encounter.   Disposition:  Follow up  2 years  Signed, Leeann Bady Verne Spurr, MD, 12/13/2022 2:07 PM    [facility] [address] [place], [place] [address]

## 2022-12-20 ENCOUNTER — Other Ambulatory Visit (HOSPITAL_COMMUNITY): Payer: Self-pay

## 2022-12-26 ENCOUNTER — Other Ambulatory Visit (HOSPITAL_COMMUNITY): Payer: Self-pay

## 2022-12-26 ENCOUNTER — Other Ambulatory Visit: Payer: Self-pay

## 2022-12-31 ENCOUNTER — Other Ambulatory Visit (HOSPITAL_COMMUNITY): Payer: Self-pay

## 2022-12-31 ENCOUNTER — Other Ambulatory Visit: Payer: Self-pay

## 2022-12-31 DIAGNOSIS — G894 Chronic pain syndrome: Secondary | ICD-10-CM | POA: Diagnosis not present

## 2022-12-31 DIAGNOSIS — M545 Low back pain, unspecified: Secondary | ICD-10-CM | POA: Diagnosis not present

## 2022-12-31 DIAGNOSIS — M79669 Pain in unspecified lower leg: Secondary | ICD-10-CM | POA: Diagnosis not present

## 2022-12-31 MED ORDER — BUPRENORPHINE HCL-NALOXONE HCL 8-2 MG SL SUBL
0.5000 | SUBLINGUAL_TABLET | Freq: Every day | SUBLINGUAL | 2 refills | Status: DC
  Filled 2022-12-31: qty 15, 30d supply, fill #0
  Filled 2023-01-24 – 2023-01-29 (×4): qty 15, 30d supply, fill #1
  Filled 2023-02-26: qty 15, 30d supply, fill #2

## 2023-01-03 ENCOUNTER — Other Ambulatory Visit (HOSPITAL_COMMUNITY): Payer: Self-pay

## 2023-01-24 ENCOUNTER — Other Ambulatory Visit (HOSPITAL_COMMUNITY): Payer: Self-pay

## 2023-01-29 ENCOUNTER — Other Ambulatory Visit (HOSPITAL_COMMUNITY): Payer: Self-pay

## 2023-01-29 ENCOUNTER — Other Ambulatory Visit: Payer: Self-pay

## 2023-02-26 ENCOUNTER — Other Ambulatory Visit (HOSPITAL_COMMUNITY): Payer: Self-pay

## 2023-02-27 ENCOUNTER — Other Ambulatory Visit: Payer: Self-pay

## 2023-03-14 ENCOUNTER — Other Ambulatory Visit (HOSPITAL_COMMUNITY): Payer: Self-pay

## 2023-03-21 IMAGING — CT CT ANGIO AOBIFEM WO/W CM
2 of 11 series · 11 of 46 positions shown, 14 images · IV contrast (APPLIED)
Comparison: 12/01/2020 and 07/26/2020 and ABI report from
02/14/2021.

CLINICAL DATA: Peripheral arterial disease.  Bilateral leg pain.

EXAM:
CT ANGIOGRAPHY OF ABDOMINAL AORTA WITH ILIOFEMORAL RUNOFF
TECHNIQUE: Multidetector CT imaging of the abdomen, pelvis and lower
extremities was performed using the standard protocol during bolus
administration of intravenous contrast. Multiplanar CT image
reconstructions and MIPs were obtained to evaluate the vascular
anatomy.
CONTRAST:  100mL OMNIPAQUE IOHEXOL 350 MG/ML SOLN

[Series 6: axial arterial upper · axial · arterial · 0.96mm/px · z∈[-1069,-187]mm · 10 of 338 slices shown, 13 images]
[im 22/338  soft-tissue]
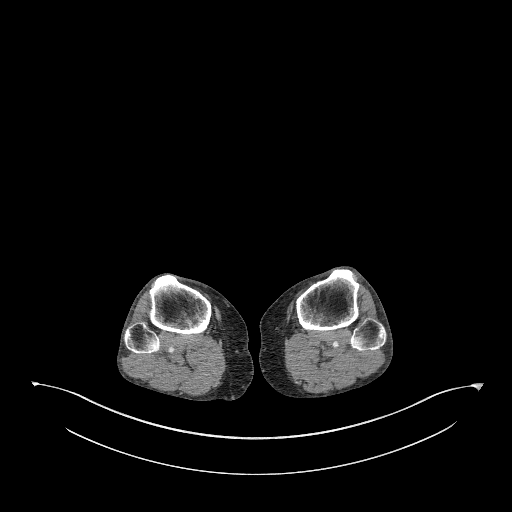
[im 22/338  bone]
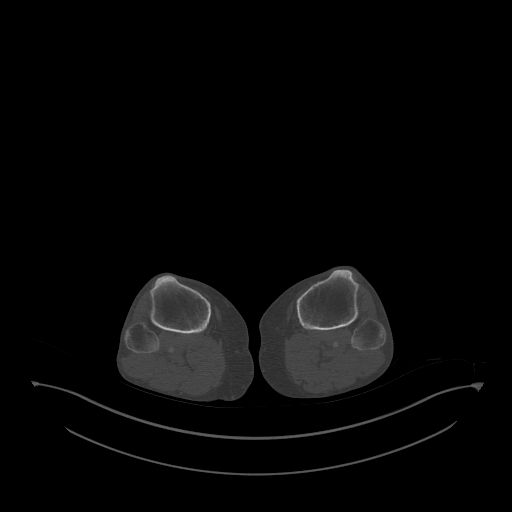
[im 64/338  soft-tissue]
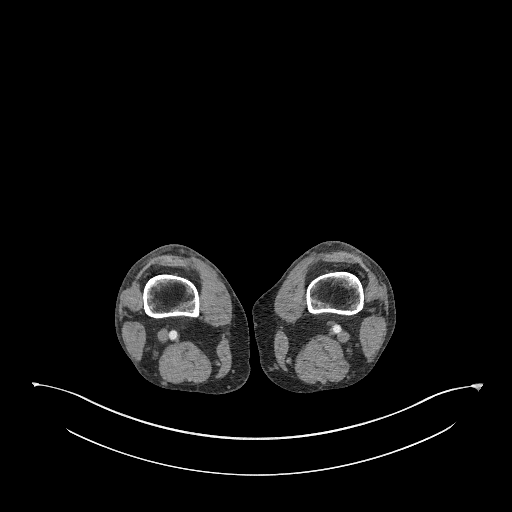
[im 106/338  soft-tissue]
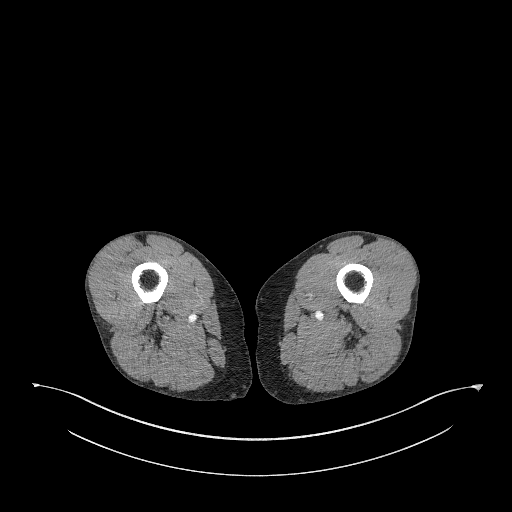
[im 148/338  soft-tissue]
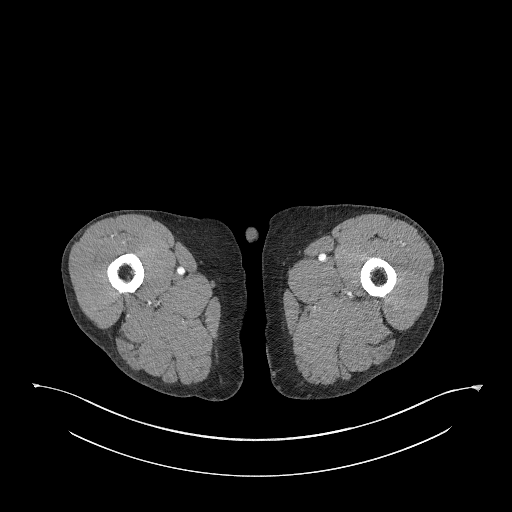
[im 190/338  soft-tissue]
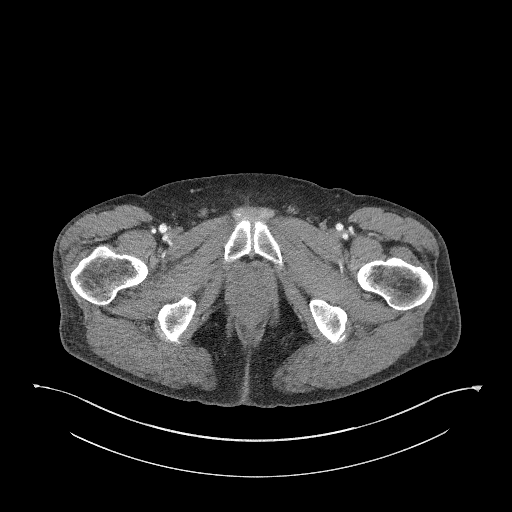
[im 232/338  soft-tissue]
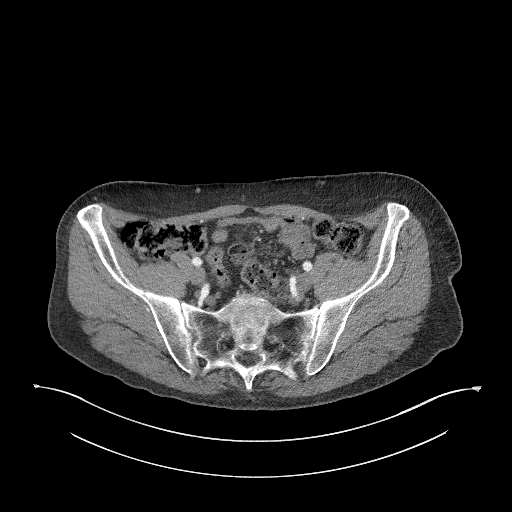
[im 253/338  lung]
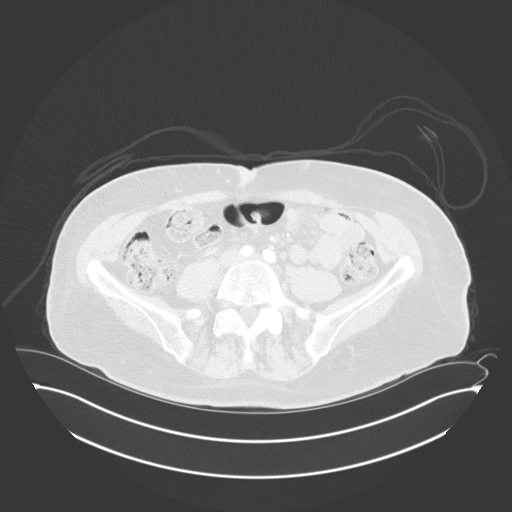
[im 274/338  soft-tissue]
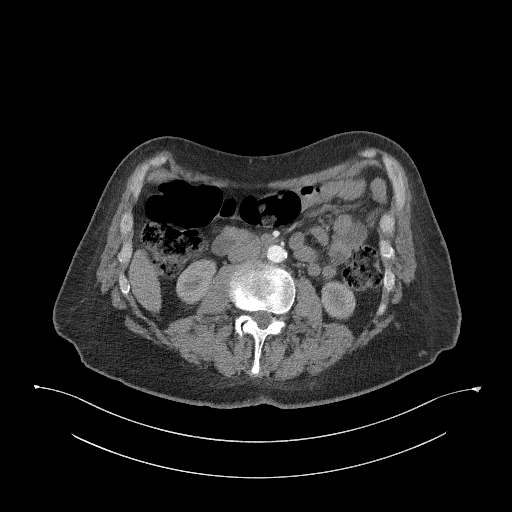
[im 274/338  lung]
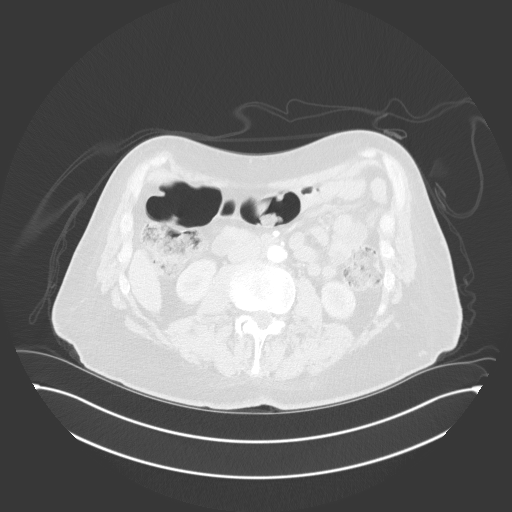
[im 295/338  lung]
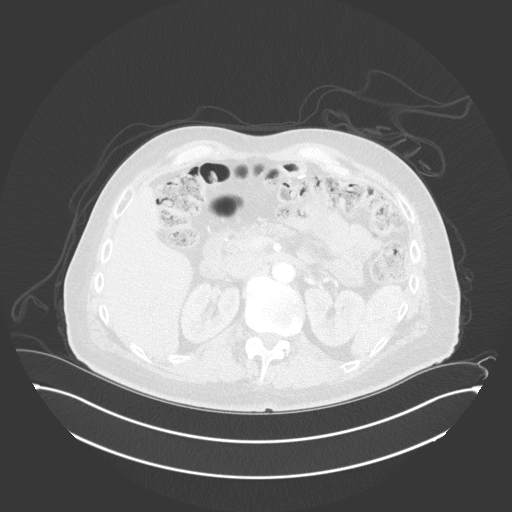
[im 316/338  soft-tissue]
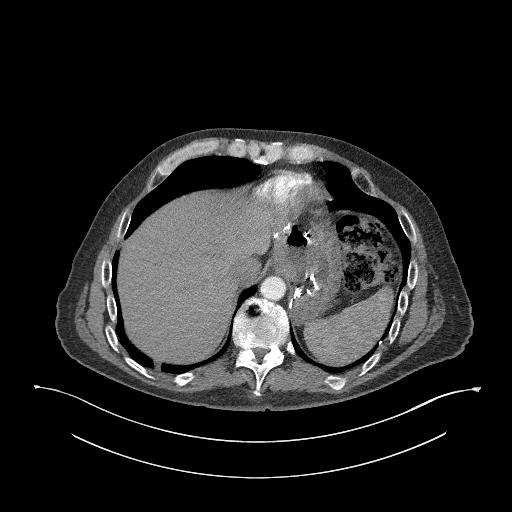
[im 316/338  lung]
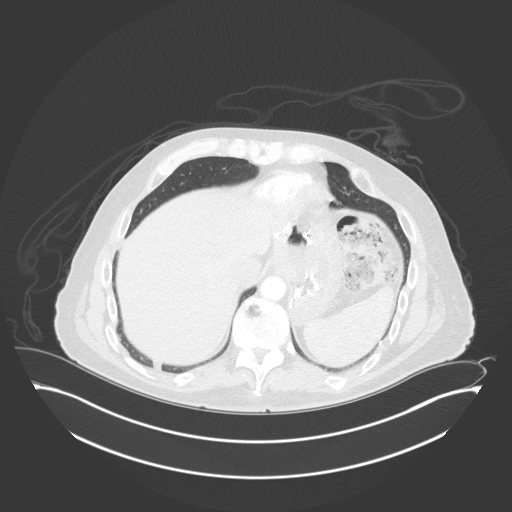

[Series 7: coronal upper · coronal · 0.80mm/px · 1 of 149 slices shown]
[im 75/149  soft-tissue]
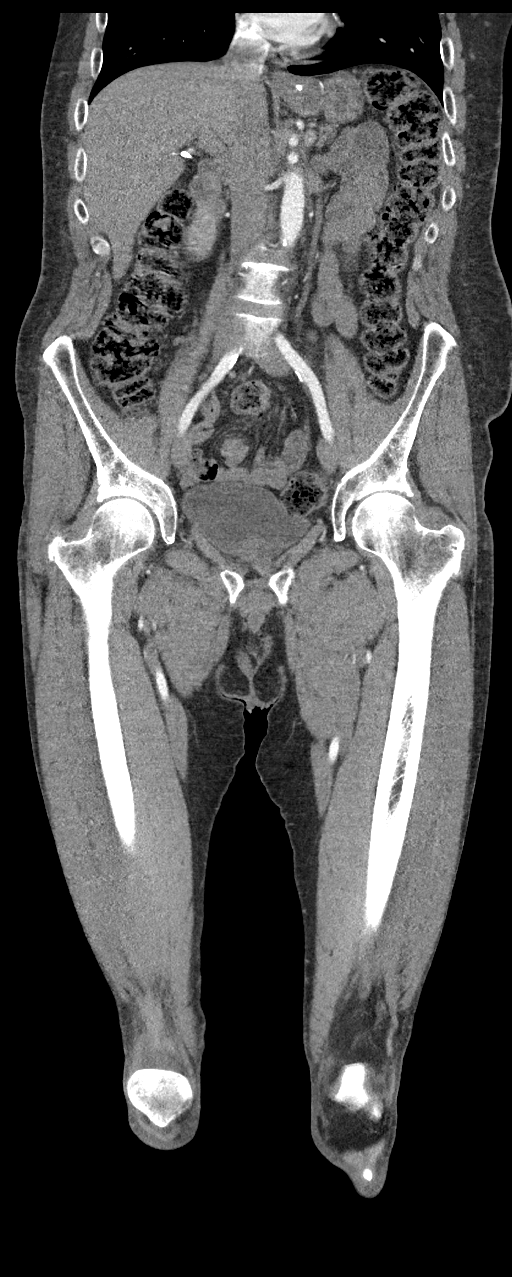

[11 of 46 positions shown; findings below may reference images not displayed]

FINDINGS: VASCULAR

Aorta: Atherosclerotic disease in the infrarenal abdominal aorta
without aneurysm or dissection. No significant aortic stenosis.

Celiac: Patent without evidence of aneurysm, dissection, vasculitis
or significant stenosis.

SMA: Patent without evidence of aneurysm, dissection, vasculitis or
significant stenosis.

Renals: Bilateral renal arteries are patent without evidence of
aneurysm, dissection, vasculitis, fibromuscular dysplasia or
significant stenosis. There are 2 renal arteries bilaterally.

IMA: Patent.

RIGHT Lower Extremity

Inflow: Mild atherosclerotic disease. Right common, internal and
external iliac arteries are patent without significant stenosis. No
evidence for dissection or aneurysm.

Outflow: Common, superficial and profunda femoral arteries and the
popliteal artery are patent without evidence of aneurysm,
dissection, vasculitis or significant stenosis.

Runoff: Proximal anterior tibial artery and the tibioperoneal trunk
is patent. Evaluation of the runoff vessels is limited due to venous
contamination. However, there is likely three-vessel runoff in the
right lower extremity.

LEFT Lower Extremity

Inflow: Atherosclerotic calcifications in the left iliac arteries.
Left common, internal and external iliac arteries are patent without
aneurysm, dissection or significant stenosis.

Outflow: Common, superficial and profunda femoral arteries and the
popliteal artery are patent without evidence of aneurysm,
dissection, vasculitis or significant stenosis.

Runoff: Proximal anterior tibial artery and tibioperoneal trunk are
patent. Limited evaluation of the runoff vessels due to the
technique and venous contamination. No definite occlusive disease in
the runoff vessels.

Veins: No obvious venous abnormality within the limitations of this
arterial phase study.

Review of the MIP images confirms the above findings.

NON-VASCULAR

Lower chest: 3 mm nodule in the right middle lobe on sequence 12,
image 2. Tiny calcified granuloma in the right middle lobe on image
2. Pleural-based nodular density in the posterior right lower lobe
on sequence 2 image 30 measures up to 1.0 cm and unchanged since
07/26/2020. This is most compatible with an area of pleural-based
scarring. No pleural effusions.

Hepatobiliary: Cholecystectomy.  Normal appearance of the liver.

Pancreas: Unremarkable. No pancreatic ductal dilatation or
surrounding inflammatory changes.

Spleen: Normal in size without focal abnormality.

Adrenals/Urinary Tract: Normal adrenal glands. Multiple left renal
calculi in the mid and lower pole regions. Largest stone measures
roughly 7 mm. Negative for left hydronephrosis. Mild distention of
the urinary bladder. Mild fullness of the right renal pelvis is
similar to the prior examinations. Negative for right
hydronephrosis. No definite right renal calculi.

Stomach/Bowel: Patient has had a Roux-en-Y gastric bypass procedure.
No evidence for bowel obstruction or focal bowel inflammation.

Lymphatic: No abdominal or pelvic lymph node enlargement.

Reproductive: Prostate calcifications.

Other: Small amount of free fluid on the right side of the pelvis on
sequence 6, image 128. Question a small periumbilical hernia
containing fat. Negative for free air.

Musculoskeletal: Mild subcutaneous edema in the bilateral calves. No
acute bone abnormality.
IMPRESSION: VASCULAR

1. Mild atherosclerotic disease involving the aorta and iliac
arteries. No significant inflow or outflow disease.
2. Limited evaluation of the runoff vessels due to timing and venous
contamination. However, no significant occlusive disease identified
in the runoff vessels.
3. Main visceral arteries are patent.

NON-VASCULAR

1. Trace free fluid in the pelvis of uncertain etiology. No evidence
for an acute inflammatory process in the abdomen or pelvis.
2. Nonobstructive left renal calculi.
3. Probable scarring at the right lung base as described.
4. Indeterminate 3 mm nodule in the right middle lobe. No follow-up
needed if patient is low-risk. Non-contrast chest CT can be
considered in 12 months if patient is high-risk. This recommendation
follows the consensus statement: Guidelines for Management of
Incidental Pulmonary Nodules Detected on CT Images: From the
5. Gastric bypass procedure.

## 2023-03-22 ENCOUNTER — Other Ambulatory Visit (HOSPITAL_COMMUNITY): Payer: Self-pay

## 2023-03-25 ENCOUNTER — Other Ambulatory Visit (HOSPITAL_COMMUNITY): Payer: Self-pay

## 2023-03-26 ENCOUNTER — Other Ambulatory Visit (HOSPITAL_COMMUNITY): Payer: Self-pay

## 2023-03-27 ENCOUNTER — Other Ambulatory Visit (HOSPITAL_COMMUNITY): Payer: Self-pay

## 2023-03-27 ENCOUNTER — Other Ambulatory Visit: Payer: Self-pay

## 2023-03-27 MED ORDER — BUPRENORPHINE HCL-NALOXONE HCL 8-2 MG SL SUBL
0.5000 | SUBLINGUAL_TABLET | Freq: Every day | SUBLINGUAL | 2 refills | Status: DC
  Filled 2023-03-27: qty 15, 30d supply, fill #0
  Filled 2023-04-26: qty 15, 30d supply, fill #1
  Filled 2023-05-26: qty 15, 30d supply, fill #2

## 2023-03-28 ENCOUNTER — Other Ambulatory Visit (HOSPITAL_COMMUNITY): Payer: Self-pay

## 2023-04-23 ENCOUNTER — Other Ambulatory Visit (HOSPITAL_COMMUNITY): Payer: Self-pay

## 2023-04-26 ENCOUNTER — Other Ambulatory Visit (HOSPITAL_COMMUNITY): Payer: Self-pay

## 2023-04-28 ENCOUNTER — Other Ambulatory Visit: Payer: Self-pay

## 2023-05-26 ENCOUNTER — Other Ambulatory Visit (HOSPITAL_COMMUNITY): Payer: Self-pay

## 2023-05-27 ENCOUNTER — Other Ambulatory Visit: Payer: Self-pay

## 2023-05-30 ENCOUNTER — Other Ambulatory Visit (HOSPITAL_COMMUNITY): Payer: Self-pay

## 2023-06-02 ENCOUNTER — Other Ambulatory Visit (HOSPITAL_COMMUNITY): Payer: Self-pay

## 2023-06-19 ENCOUNTER — Other Ambulatory Visit (HOSPITAL_COMMUNITY): Payer: Self-pay

## 2023-06-19 ENCOUNTER — Other Ambulatory Visit: Payer: Self-pay

## 2023-06-19 MED ORDER — BUPRENORPHINE HCL-NALOXONE HCL 8-2 MG SL SUBL
0.5000 | SUBLINGUAL_TABLET | Freq: Every day | SUBLINGUAL | 2 refills | Status: DC
  Filled 2023-06-19 – 2023-07-04 (×3): qty 15, 30d supply, fill #0
  Filled 2023-08-01: qty 15, 30d supply, fill #1
  Filled 2023-09-01: qty 15, 30d supply, fill #2

## 2023-06-19 MED ORDER — MODAFINIL 100 MG PO TABS
100.0000 mg | ORAL_TABLET | Freq: Every morning | ORAL | 3 refills | Status: DC
  Filled 2023-06-19: qty 30, 15d supply, fill #0
  Filled 2023-09-01: qty 30, 15d supply, fill #1
  Filled 2023-09-16: qty 30, 15d supply, fill #2
  Filled 2023-10-02: qty 30, 15d supply, fill #3

## 2023-06-24 ENCOUNTER — Other Ambulatory Visit (HOSPITAL_COMMUNITY): Payer: Self-pay

## 2023-07-03 ENCOUNTER — Other Ambulatory Visit (HOSPITAL_COMMUNITY): Payer: Self-pay

## 2023-07-04 ENCOUNTER — Other Ambulatory Visit (HOSPITAL_COMMUNITY): Payer: Self-pay

## 2023-07-11 ENCOUNTER — Other Ambulatory Visit: Payer: Self-pay

## 2023-07-11 ENCOUNTER — Other Ambulatory Visit (HOSPITAL_COMMUNITY): Payer: Self-pay

## 2023-07-21 ENCOUNTER — Other Ambulatory Visit (HOSPITAL_COMMUNITY): Payer: Self-pay

## 2023-07-31 ENCOUNTER — Other Ambulatory Visit (HOSPITAL_COMMUNITY): Payer: Self-pay

## 2023-08-01 ENCOUNTER — Other Ambulatory Visit: Payer: Self-pay

## 2023-08-05 ENCOUNTER — Other Ambulatory Visit: Payer: Self-pay

## 2023-08-07 ENCOUNTER — Other Ambulatory Visit: Payer: Self-pay

## 2023-08-12 ENCOUNTER — Other Ambulatory Visit (HOSPITAL_COMMUNITY): Payer: Self-pay

## 2023-08-12 ENCOUNTER — Other Ambulatory Visit: Payer: Self-pay

## 2023-08-14 ENCOUNTER — Other Ambulatory Visit: Payer: Self-pay

## 2023-09-01 ENCOUNTER — Other Ambulatory Visit (HOSPITAL_COMMUNITY): Payer: Self-pay

## 2023-09-01 ENCOUNTER — Other Ambulatory Visit: Payer: Self-pay

## 2023-09-16 ENCOUNTER — Other Ambulatory Visit (HOSPITAL_COMMUNITY): Payer: Self-pay

## 2023-09-18 ENCOUNTER — Other Ambulatory Visit (HOSPITAL_COMMUNITY): Payer: Self-pay

## 2023-09-19 ENCOUNTER — Other Ambulatory Visit: Payer: Self-pay

## 2023-09-23 ENCOUNTER — Other Ambulatory Visit (HOSPITAL_COMMUNITY): Payer: Self-pay

## 2023-10-02 ENCOUNTER — Other Ambulatory Visit: Payer: Self-pay

## 2023-10-02 ENCOUNTER — Other Ambulatory Visit (HOSPITAL_COMMUNITY): Payer: Self-pay

## 2023-10-03 ENCOUNTER — Other Ambulatory Visit (HOSPITAL_COMMUNITY): Payer: Self-pay

## 2023-10-13 ENCOUNTER — Other Ambulatory Visit (HOSPITAL_COMMUNITY): Payer: Self-pay

## 2023-10-13 MED ORDER — BUPRENORPHINE HCL-NALOXONE HCL 8-2 MG SL SUBL
0.5000 | SUBLINGUAL_TABLET | Freq: Every day | SUBLINGUAL | 2 refills | Status: DC
  Filled 2023-10-13: qty 15, 30d supply, fill #0
  Filled 2023-11-25: qty 15, 30d supply, fill #1
  Filled 2023-12-24: qty 15, 30d supply, fill #2

## 2023-10-13 MED ORDER — MODAFINIL 100 MG PO TABS
100.0000 mg | ORAL_TABLET | Freq: Every morning | ORAL | 3 refills | Status: DC
  Filled 2023-10-13 – 2023-10-15 (×2): qty 30, 15d supply, fill #0
  Filled 2023-11-04: qty 30, 15d supply, fill #1
  Filled 2023-11-25: qty 30, 15d supply, fill #2
  Filled 2023-12-11: qty 30, 15d supply, fill #3

## 2023-10-13 MED ORDER — CYCLOBENZAPRINE HCL 5 MG PO TABS
5.0000 mg | ORAL_TABLET | Freq: Every day | ORAL | 0 refills | Status: DC | PRN
  Filled 2023-10-13: qty 30, 30d supply, fill #0

## 2023-10-15 ENCOUNTER — Other Ambulatory Visit (HOSPITAL_COMMUNITY): Payer: Self-pay

## 2023-10-16 ENCOUNTER — Other Ambulatory Visit (HOSPITAL_COMMUNITY): Payer: Self-pay

## 2023-10-16 ENCOUNTER — Other Ambulatory Visit: Payer: Self-pay

## 2023-11-04 ENCOUNTER — Other Ambulatory Visit (HOSPITAL_COMMUNITY): Payer: Self-pay

## 2023-11-25 ENCOUNTER — Other Ambulatory Visit (HOSPITAL_COMMUNITY): Payer: Self-pay

## 2023-11-26 ENCOUNTER — Other Ambulatory Visit: Payer: Self-pay

## 2023-12-10 ENCOUNTER — Other Ambulatory Visit (HOSPITAL_COMMUNITY): Payer: Self-pay

## 2023-12-11 ENCOUNTER — Other Ambulatory Visit (HOSPITAL_COMMUNITY): Payer: Self-pay

## 2023-12-23 ENCOUNTER — Other Ambulatory Visit (HOSPITAL_COMMUNITY): Payer: Self-pay

## 2023-12-24 ENCOUNTER — Other Ambulatory Visit: Payer: Self-pay

## 2023-12-25 ENCOUNTER — Other Ambulatory Visit (HOSPITAL_COMMUNITY): Payer: Self-pay

## 2024-01-29 ENCOUNTER — Other Ambulatory Visit (HOSPITAL_COMMUNITY): Payer: Self-pay

## 2024-01-29 ENCOUNTER — Other Ambulatory Visit: Payer: Self-pay

## 2024-01-29 MED ORDER — CYCLOBENZAPRINE HCL 5 MG PO TABS
5.0000 mg | ORAL_TABLET | Freq: Every day | ORAL | 0 refills | Status: DC | PRN
  Filled 2024-01-29: qty 30, 30d supply, fill #0

## 2024-01-29 MED ORDER — BUPRENORPHINE HCL-NALOXONE HCL 8-2 MG SL SUBL
0.5000 | SUBLINGUAL_TABLET | Freq: Every day | SUBLINGUAL | 2 refills | Status: DC
  Filled 2024-01-29: qty 15, 30d supply, fill #0
  Filled 2024-03-01: qty 15, 30d supply, fill #1
  Filled 2024-04-02: qty 15, 30d supply, fill #2

## 2024-01-29 MED ORDER — MODAFINIL 100 MG PO TABS
100.0000 mg | ORAL_TABLET | Freq: Every morning | ORAL | 3 refills | Status: DC
  Filled 2024-01-29: qty 30, 15d supply, fill #0
  Filled 2024-02-17: qty 30, 15d supply, fill #1
  Filled 2024-02-27 – 2024-03-05 (×3): qty 30, 15d supply, fill #2
  Filled 2024-03-22: qty 30, 15d supply, fill #3

## 2024-01-30 ENCOUNTER — Other Ambulatory Visit (HOSPITAL_COMMUNITY): Payer: Self-pay

## 2024-02-05 ENCOUNTER — Other Ambulatory Visit: Payer: Self-pay | Admitting: Urology

## 2024-02-05 DIAGNOSIS — N200 Calculus of kidney: Secondary | ICD-10-CM

## 2024-02-11 ENCOUNTER — Ambulatory Visit: Payer: Self-pay | Admitting: Urology

## 2024-02-11 ENCOUNTER — Ambulatory Visit (HOSPITAL_COMMUNITY)
Admission: RE | Admit: 2024-02-11 | Discharge: 2024-02-11 | Disposition: A | Discharge status: Home / Self Care | Source: Ambulatory Visit | Attending: Urology | Admitting: Urology

## 2024-02-11 ENCOUNTER — Other Ambulatory Visit (HOSPITAL_COMMUNITY): Payer: Self-pay

## 2024-02-11 ENCOUNTER — Encounter: Payer: Self-pay | Admitting: Urology

## 2024-02-11 VITALS — BP 137/74 | HR 71

## 2024-02-11 DIAGNOSIS — N400 Enlarged prostate without lower urinary tract symptoms: Secondary | ICD-10-CM | POA: Diagnosis not present

## 2024-02-11 DIAGNOSIS — N201 Calculus of ureter: Secondary | ICD-10-CM | POA: Diagnosis not present

## 2024-02-11 DIAGNOSIS — N200 Calculus of kidney: Secondary | ICD-10-CM

## 2024-02-11 DIAGNOSIS — R1032 Left lower quadrant pain: Secondary | ICD-10-CM | POA: Diagnosis not present

## 2024-02-11 LAB — URINALYSIS, ROUTINE W REFLEX MICROSCOPIC
Bilirubin, UA: NEGATIVE
Glucose, UA: NEGATIVE
Ketones, UA: NEGATIVE
Leukocytes,UA: NEGATIVE
Nitrite, UA: NEGATIVE
Protein,UA: NEGATIVE
Specific Gravity, UA: 1.01 (ref 1.005–1.030)
Urobilinogen, Ur: 0.2 mg/dL (ref 0.2–1.0)
pH, UA: 7 (ref 5.0–7.5)

## 2024-02-11 LAB — MICROSCOPIC EXAMINATION
Bacteria, UA: NONE SEEN
RBC, Urine: >30 /HPF — AB (ref 0–2)
WBC, UA: NONE SEEN /HPF (ref 0–5)

## 2024-02-11 LAB — BLADDER SCAN AMB NON-IMAGING: Scan Result: 41

## 2024-02-11 MED ORDER — TAMSULOSIN HCL 0.4 MG PO CAPS
ORAL_CAPSULE | ORAL | 1 refills | Status: DC
  Filled 2024-02-11 – 2024-02-17 (×2): qty 30, 30d supply, fill #0
  Filled 2024-03-17: qty 30, 30d supply, fill #1
  Filled ????-??-??: fill #0

## 2024-02-11 NOTE — Progress Notes (Signed)
 Name: Paul Sawyer DOB: 1971/08/06 MRN: [id number]  History of Present Illness: Mr. Coxe is a 53 y.o. male who presents today for follow up visit at [facility] [place]. Relevant History includes: 1. Kidney stones. Has had prior ESWL procedures by Dr. Sherrilee.  2. BPH.  Today: KUB today: Awaiting radiology read; 5 mm distal left ureteral stone appreciated per provider interpretation along with two left intrarenal stones.  He reports recent suspected stone migration / passage due to LLQ abdominal pain over the past 4-5 days. He denies fevers, nausea, or vomiting. He denies dysuria or gross hematuria. Reports that at baseline he has intermittent urinary hesitancy and straining to void.  Medications: Current Outpatient Medications  Medication Sig Dispense Refill   buprenorphine -naloxone  (SUBOXONE ) 8-2 mg SUBL SL tablet Place 0.5 tablets under the tongue daily. 15 tablet 2   cyclobenzaprine  (FLEXERIL ) 10 MG tablet Take 1 tablet (10 mg total) by mouth 3 (three) times daily as needed. 90 tablet 5   modafinil  (PROVIGIL ) 100 MG tablet Take 1-2 tablets (100-200 mg total) by mouth every morning. 30 tablet 3   Multiple Vitamin (MULTIVITAMIN WITH MINERALS) TABS tablet Take 1 tablet by mouth daily.     tamsulosin  (FLOMAX ) 0.4 MG CAPS capsule Take 1 capsule by mouth daily as needed for stone symptoms. Advised to contact urology provider / request office visit if stone symptoms fail to resolve. Go to ER if symptoms become severe. 30 capsule 1   aspirin  EC 81 MG tablet Take 1 tablet (81 mg total) by mouth daily. Swallow whole. (Patient not taking: Reported on 02/11/2024) 90 tablet 3   Buprenorphine  HCl-Naloxone  HCl 8-2 MG FILM Place 1 film under the tongue and allow to dissolve once a day (Patient not taking: Reported on 02/11/2024) 28 each 2   buprenorphine -naloxone  (SUBOXONE ) 8-2 mg SUBL SL tablet Place 1 tablet under the tongue daily (Patient not taking: Reported on 02/11/2024) 28 tablet  1   buprenorphine -naloxone  (SUBOXONE ) 8-2 mg SUBL SL tablet Place 1 tablet under the tongue daily. (Patient not taking: Reported on 02/11/2024) 28 tablet 2   buprenorphine -naloxone  (SUBOXONE ) 8-2 mg SUBL SL tablet Dissolve 1 tablet by mouth daily (Patient not taking: Reported on 02/11/2024) 28 tablet 0   buprenorphine -naloxone  (SUBOXONE ) 8-2 mg SUBL SL tablet Dissolve one tablet under the tongue daily. (Patient not taking: Reported on 02/11/2024) 28 tablet 2   buprenorphine -naloxone  (SUBOXONE ) 8-2 mg SUBL SL tablet Place 1 tablet under the tongue daily. (Patient not taking: Reported on 02/11/2024) 28 tablet 2   cyclobenzaprine  (FLEXERIL ) 10 MG tablet Take 1 tablet by mouth 3 times daily as needed. (Patient not taking: Reported on 02/11/2024) 90 tablet 10   cyclobenzaprine  (FLEXERIL ) 5 MG tablet Take 1 tablet (5 mg total) by mouth daily as needed. (Patient not taking: Reported on 02/11/2024) 30 tablet 0   cyclobenzaprine  (FLEXERIL ) 5 MG tablet Take 1 tablet (5 mg total) by mouth daily as needed. (Patient not taking: Reported on 02/11/2024) 30 tablet 0   morphine  (MSIR) 15 MG tablet Take 1 tablet by mouth every 4 hours as needed for severe pain. (Patient not taking: Reported on 02/11/2024) 30 tablet 0   omeprazole  (PRILOSEC) 20 MG capsule Take 1 capsule by mouth daily. (Patient not taking: Reported on 02/11/2024) 30 capsule 1   No current facility-administered medications for this visit.    Allergies: Allergies  Allergen Reactions   Ciprofloxacin     Altered mental state   Nsaids Nausea And Vomiting  Upset stomach     Past Medical History:  Diagnosis Date   Arthritis    Atherosclerosis    Clotting disorder (HCC)    blood clot/dvt   History of kidney stones    PAD (peripheral artery disease) (HCC)    Varicose veins of both lower extremities    Past Surgical History:  Procedure Laterality Date   CHOLECYSTECTOMY  2004   EXTRACORPOREAL SHOCK WAVE LITHOTRIPSY Left 08/08/2020   Procedure: EXTRACORPOREAL  SHOCK WAVE LITHOTRIPSY (ESWL);  Surgeon: Sherrilee Belvie CROME, MD;  Location: [facility];  Service: Urology;  Laterality: Left;   EXTRACORPOREAL SHOCK WAVE LITHOTRIPSY Left 08/21/2021   Procedure: EXTRACORPOREAL SHOCK WAVE LITHOTRIPSY (ESWL);  Surgeon: Sherrilee Belvie CROME, MD;  Location: [facility];  Service: Urology;  Laterality: Left;   GASTRIC BYPASS  2005   Family History  Problem Relation Age of Onset   Diabetes Father    Heart disease Father    Colon cancer Neg Hx    Esophageal cancer Neg Hx    Rectal cancer Neg Hx    Stomach cancer Neg Hx    Social History   Socioeconomic History   Marital status: Married    Spouse name: Not on file   Number of children: Not on file   Years of education: Not on file   Highest education level: Not on file  Occupational History   Not on file  Tobacco Use   Smoking status: Never   Smokeless tobacco: Never  Vaping Use   Vaping status: Never Used  Substance and Sexual Activity   Alcohol use: Yes    Alcohol/week: 1.0 standard drink of alcohol    Types: 1 Glasses of wine per week    Comment: weekly   Drug use: No   Sexual activity: Yes  Other Topics Concern   Not on file  Social History Narrative   Not on file   Social Drivers of Health   Financial Resource Strain: Not on file  Food Insecurity: Not on file  Transportation Needs: Not on file  Physical Activity: Not on file  Stress: Not on file  Social Connections: Not on file  Intimate Partner Violence: Not on file    SUBJECTIVE  Review of Systems Constitutional: Patient denies any unintentional weight loss or change in strength lntegumentary: Patient denies any rashes or pruritus Cardiovascular: Patient denies chest pain or syncope Respiratory: Patient denies shortness of breath Gastrointestinal: Patient denies nausea, vomiting, constipation, or diarrhea  Musculoskeletal: Patient denies muscle cramps or weakness Neurologic: Patient denies convulsions or  seizures Allergic/Immunologic: Patient denies recent allergic reaction(s) Hematologic/Lymphatic: Patient denies bleeding tendencies Endocrine: Patient denies heat/cold intolerance  GU: As per HPI.  OBJECTIVE Vitals:   02/11/24 1312  BP: 137/74  Pulse: 71   There is no height or weight on file to calculate BMI.  Physical Examination Constitutional: No obvious distress; patient is non-toxic appearing  Cardiovascular: No visible lower extremity edema.  Respiratory: The patient does not have audible wheezing/stridor; respirations do not appear labored  Gastrointestinal: Abdomen non-distended Musculoskeletal: Normal ROM of UEs  Skin: No obvious rashes/open sores  Neurologic: CN 2-12 grossly intact Psychiatric: Answered questions appropriately with normal affect  Hematologic/Lymphatic/Immunologic: No obvious bruises or sites of spontaneous bleeding  Urine microscopy: >30 RBC/hpf, otherwise unremarkable PVR: 41 ml  ASSESSMENT Left ureteral stone - Plan: tamsulosin  (FLOMAX ) 0.4 MG CAPS capsule, DG Abd 1 View, US  RENAL  LLQ pain - Plan: tamsulosin  (FLOMAX ) 0.4 MG CAPS capsule, DG Abd 1 View, US  RENAL  Kidney stones - Plan: Urinalysis, Routine w reflex microscopic, tamsulosin  (FLOMAX ) 0.4 MG CAPS capsule, DG Abd 1 View, US  RENAL  Benign prostatic hyperplasia, unspecified whether lower urinary tract symptoms present - Plan: BLADDER SCAN AMB NON-IMAGING  We reviewed recent imaging results and discussed evidence of distal left ureteral stone. Advised Flomax  0.4 mg daily for medical expulsive therapy (MET) for 2 weeks then follow up with KUB and RUS for recheck.   Once the obstructing distal left ureteral stone is resolved he would like to proceed with proactive ESWL for the left intrarenal stones and also expressed interest in doing a 24 hour urinalysis ([facility]) for metabolic stone evaluation for more targeted recommendations for dietary I medication therapies for stone prevention.     He was advised to contact urology provider or go to the ER if He develops fever >101F, uncontrollable pain, or other significantly concerning symptoms prior to next office visit.  He verbalized understanding and agreement. All questions were answered.  All questions were answered.  PLAN Advised the following: Flomax  daily x2 weeks. Analgesics PRN for pain. Maintain adequate fluid intake daily. Low oxalate diet. Return in about 2 weeks (around 02/25/2024) for Stone, with UA & PVR, will need KUB & RUS prior.  Orders Placed This Encounter  Procedures   DG Abd 1 View    Standing Status:   Future    Expected Date:   02/25/2024    Expiration Date:   02/10/2025    Reason for Exam (SYMPTOM  OR DIAGNOSIS REQUIRED):   kidney stone    Preferred imaging location?:   [facility]   US  RENAL    Standing Status:   Future    Expected Date:   02/25/2024    Expiration Date:   02/10/2025    Reason for Exam (SYMPTOM  OR DIAGNOSIS REQUIRED):   kidney stone known or suspected    Preferred imaging location?:   [facility]   Urinalysis, Routine w reflex microscopic   BLADDER SCAN AMB NON-IMAGING    It has been explained that the patient is to follow regularly with their PCP in addition to all other providers involved in their care and to follow instructions provided by these respective offices. Patient advised to contact urology clinic if any urologic-pertaining questions, concerns, new symptoms or problems arise in the interim period.  Patient Instructions  >80% of stones are calcium oxalate. This type of stones forms when body either isn't clearing oxalate well enough, is making too much oxalate, or too little citrate. This results in oxalate binding to form crystals, which continue to aggregate and form stones.  Limiting calcium does not help, but limiting oxalate in the diet can help. Increasing citric acid intake may also help.  The following measures may help to prevent the  recurrence of stones: Increase water intake to 2-2.5 liters per day May add citrus juice (lemon, lime or orange juice) to water Moderation in dairy foods Decrease in salt content 5. Low Oxalate diet: Oxylates are found in foods like Tomato, Spinach, red wine and chocolate (see additional resources below).  Internet resources for information regarding low oxalate diet: https://kidneystones.yangchunwu.com https://my.VerticalStretch.be  Foods Low in Sodium or Oxalate Foods You Can Eat  Drinks Coffee, fruit and veggie juice (using the recommended veggies), fruit punch  Fruits Apples, apricots (fresh or canned), avocado, bananas, cherries (sweet), cranberries, grapefruit, red or green grapes, lemon and lime juice, melons, nectarines, papayas, peaches, pears, pineapples, oranges, strawberries (fresh), tangerines  Veggies Artichokes, asparagus,  bamboo shoots, broccoli, brussels sprouts, cabbage, cauliflower, chayote squash, chicory, corn, cucumbers, endive, lettuce, lima beans, mushrooms, onions, peas, peppers, potatoes, radishes, rutabagas, zucchini  Breads, Cereals, Grains Egg noodles, rye bread, cooked and dry cereals without nuts or bran, crackers with unsalted tops, white or wild rice  Meat, Meat Replacements, Fish, Recruitment consultant, fish, poultry, eggs, egg whites, egg replacements  Soup Homemade soup (using the recommended veggies and meat), low-sodium bouillon, low-sodium canned  Desserts Cookies, cakes, ice cream, pudding without chocolate or nuts, candy without chocolate or nuts  Fats and Oils Butter, margarine, cream, oil, salad dressing, mayo  Other Foods Unsalted potato chips or pretzels, herbs (like garlic, garlic powder, onion powder), lemon juice, salt-free seasoning blends, vinegar  Other Foods Low in Oxalate Foods You Can Eat  Drinks Beer, cola, wine, buttermilk, lemonade or limeade (without  added vitamin C), milk  Meat, Meat Replacements, Fish, [employer] meat, ham, bacon, hot dogs, bratwurst, sausage, chicken nuggets, cheddar cheese, canned fish and shellfish  Soup Tomato soup, cheese soup  Other Foods Coconuts, lemon or lime juices, sugar or sweeteners, jellies or jams (from the recommended list)   Moderate-Oxalate Foods Foods to Limit   Drinks Fruit and veggie juices (from the list below), chocolate milk, rice milk, hot cocoa, tea   Fruits Blackberries, blueberries, black currants, cherries (sour), fruit cocktail, mangoes, orange peel, prunes, purple plums   Veggies Baked beans, carrots, celery, green beans, parsnips, summer squash, tomatoes, turnips   Breads, Cereals, Grains White bread, cornbread or cornmeal, white English muffins, saltine or soda crackers, brown rice, vanilla wafers, spaghetti and other noodles, firm tofu, bagels, oatmeal   Meat/meat replacements, fish, poultry Sardines   Desserts Chocolate cake   Fats and Oils Macadamia nuts, pistachio nuts, English walnuts   Other Foods Jams or jellies (made with the fruits above), pepper    High-Oxalate Foods Foods to Avoid Drinks Chocolate drink mixes, soy milk, Ovaltine, instant iced tea, fruit juices of fruits listed below Fruits Apricots (dried), red currants, figs, kiwi, plums, rhubarb Veggies Beans (wax, dried), beets and beet greens, chives, collard greens, eggplant, escarole, dark greens of all kinds, leeks, okra, parsley, rutabagas, spinach, Swiss chard, tomato paste, watercress Breads, Cereals, Grains Amaranth, barley, white corn flour, fried potatoes, fruitcake, grits, soybean products, sweet potatoes, wheat germ and bran, buckwheat flour, All Bran cereal, graham crackers, pretzels, whole wheat bread Meat/meat replacements, fish, poultry  Dried beans, peanut butter, soy burgers, miso  Desserts Carob, chocolate, marmalades Fats and Oils Nuts (peanuts, almonds, pecans, cashews, hazelnuts), nut butters,  sesame seeds, tahini paste Other Foods Poppy seeds   Electronically signed by:  Lauraine KYM Oz, MSN, FNP-C, CUNP 02/11/2024 2:02 PM

## 2024-02-11 NOTE — Progress Notes (Signed)
 Bladder Scan completed today.  Patient can void prior to the bladder scan. Bladder scan result: 41  Performed By: Exie DASEN. CMA

## 2024-02-11 NOTE — Patient Instructions (Signed)

## 2024-02-12 ENCOUNTER — Ambulatory Visit: Payer: Self-pay | Admitting: Urology

## 2024-02-17 ENCOUNTER — Other Ambulatory Visit: Payer: Self-pay

## 2024-02-17 ENCOUNTER — Other Ambulatory Visit (HOSPITAL_COMMUNITY): Payer: Self-pay

## 2024-02-19 ENCOUNTER — Ambulatory Visit (HOSPITAL_COMMUNITY): Attending: Urology

## 2024-02-20 ENCOUNTER — Other Ambulatory Visit (HOSPITAL_COMMUNITY): Payer: Self-pay

## 2024-02-23 ENCOUNTER — Ambulatory Visit: Admitting: Urology

## 2024-02-23 NOTE — Progress Notes (Deleted)
 Name: Paul Sawyer DOB: 05-31-1971 MRN: [id number]  History of Present Illness: Paul Sawyer is a 53 y.o. male who presents today for follow up visit at [facility] [place]. Relevant History includes: 1. Kidney stones. Has had prior ESWL procedures by Dr. Sherrilee.  2. BPH with LUTS (intermittent urinary hesitancy and straining to void).   At last visit on 02/11/2024: - Appeared to have a 5 mm distal left ureteral stone appreciated per provider interpretation along with two left intrarenal stones.  - Symptomatic for stone migration with LLQ abdominal pain x4-5 days.  - Advised the following: Flomax  daily x2 weeks. Analgesics PRN for pain. Maintain adequate fluid intake daily. Low oxalate diet. Return in about 2 weeks (around 02/25/2024) for Stone, with UA & PVR, will need KUB & RUS prior.  Since last visit: No show for RUS on 02/19/2024.  Today: KUB today: Awaiting radiology read; *** appreciated per provider interpretation.  He {Actions; denies-reports:120008} recent suspected stone migration / passage. He {Actions; denies-reports:120008} flank pain or abdominal pain. He {Actions; denies-reports:120008} fevers, nausea, or vomiting.  He {Actions; denies-reports:120008} increased urinary urgency, frequency, nocturia, dysuria, gross hematuria, hesitancy, straining to void, or sensations of incomplete emptying.  Medications: Current Outpatient Medications  Medication Sig Dispense Refill   aspirin  EC 81 MG tablet Take 1 tablet (81 mg total) by mouth daily. Swallow whole. (Patient not taking: Reported on 02/11/2024) 90 tablet 3   Buprenorphine  HCl-Naloxone  HCl 8-2 MG FILM Place 1 film under the tongue and allow to dissolve once a day (Patient not taking: Reported on 02/11/2024) 28 each 2   buprenorphine -naloxone  (SUBOXONE ) 8-2 mg SUBL SL tablet Place 1 tablet under the tongue daily (Patient not taking: Reported on 02/11/2024) 28 tablet 1   buprenorphine -naloxone  (SUBOXONE ) 8-2  mg SUBL SL tablet Place 1 tablet under the tongue daily. (Patient not taking: Reported on 02/11/2024) 28 tablet 2   buprenorphine -naloxone  (SUBOXONE ) 8-2 mg SUBL SL tablet Dissolve 1 tablet by mouth daily (Patient not taking: Reported on 02/11/2024) 28 tablet 0   buprenorphine -naloxone  (SUBOXONE ) 8-2 mg SUBL SL tablet Dissolve one tablet under the tongue daily. (Patient not taking: Reported on 02/11/2024) 28 tablet 2   buprenorphine -naloxone  (SUBOXONE ) 8-2 mg SUBL SL tablet Place 1 tablet under the tongue daily. (Patient not taking: Reported on 02/11/2024) 28 tablet 2   buprenorphine -naloxone  (SUBOXONE ) 8-2 mg SUBL SL tablet Place 0.5 tablets under the tongue daily. 15 tablet 2   cyclobenzaprine  (FLEXERIL ) 10 MG tablet Take 1 tablet (10 mg total) by mouth 3 (three) times daily as needed. 90 tablet 5   cyclobenzaprine  (FLEXERIL ) 10 MG tablet Take 1 tablet by mouth 3 times daily as needed. (Patient not taking: Reported on 02/11/2024) 90 tablet 10   cyclobenzaprine  (FLEXERIL ) 5 MG tablet Take 1 tablet (5 mg total) by mouth daily as needed. (Patient not taking: Reported on 02/11/2024) 30 tablet 0   cyclobenzaprine  (FLEXERIL ) 5 MG tablet Take 1 tablet (5 mg total) by mouth daily as needed. (Patient not taking: Reported on 02/11/2024) 30 tablet 0   modafinil  (PROVIGIL ) 100 MG tablet Take 1-2 tablets (100-200 mg total) by mouth every morning. 30 tablet 3   morphine  (MSIR) 15 MG tablet Take 1 tablet by mouth every 4 hours as needed for severe pain. (Patient not taking: Reported on 02/11/2024) 30 tablet 0   Multiple Vitamin (MULTIVITAMIN WITH MINERALS) TABS tablet Take 1 tablet by mouth daily.     omeprazole  (PRILOSEC) 20 MG capsule Take 1 capsule by mouth  daily. (Patient not taking: Reported on 02/11/2024) 30 capsule 1   tamsulosin  (FLOMAX ) 0.4 MG CAPS capsule Take 1 capsule by mouth daily as needed for stone symptoms. Advised to contact urology provider / request office visit if stone symptoms fail to resolve. Go to ER if  symptoms become severe. 30 capsule 1   No current facility-administered medications for this visit.    Allergies: Allergies  Allergen Reactions   Ciprofloxacin     Altered mental state   Nsaids Nausea And Vomiting    Upset stomach     Past Medical History:  Diagnosis Date   Arthritis    Atherosclerosis    Clotting disorder (HCC)    blood clot/dvt   History of kidney stones    PAD (peripheral artery disease) (HCC)    Varicose veins of both lower extremities    Past Surgical History:  Procedure Laterality Date   CHOLECYSTECTOMY  2004   EXTRACORPOREAL SHOCK WAVE LITHOTRIPSY Left 08/08/2020   Procedure: EXTRACORPOREAL SHOCK WAVE LITHOTRIPSY (ESWL);  Surgeon: Sherrilee Belvie CROME, MD;  Location: [facility];  Service: Urology;  Laterality: Left;   EXTRACORPOREAL SHOCK WAVE LITHOTRIPSY Left 08/21/2021   Procedure: EXTRACORPOREAL SHOCK WAVE LITHOTRIPSY (ESWL);  Surgeon: Sherrilee Belvie CROME, MD;  Location: [facility];  Service: Urology;  Laterality: Left;   GASTRIC BYPASS  2005   Family History  Problem Relation Age of Onset   Diabetes Father    Heart disease Father    Colon cancer Neg Hx    Esophageal cancer Neg Hx    Rectal cancer Neg Hx    Stomach cancer Neg Hx    Social History   Socioeconomic History   Marital status: Married    Spouse name: Not on file   Number of children: Not on file   Years of education: Not on file   Highest education level: Not on file  Occupational History   Not on file  Tobacco Use   Smoking status: Never   Smokeless tobacco: Never  Vaping Use   Vaping status: Never Used  Substance and Sexual Activity   Alcohol use: Yes    Alcohol/week: 1.0 standard drink of alcohol    Types: 1 Glasses of wine per week    Comment: weekly   Drug use: No   Sexual activity: Yes  Other Topics Concern   Not on file  Social History Narrative   Not on file   Social Drivers of Health   Financial Resource Strain: Not on file  Food Insecurity: Not on file   Transportation Needs: Not on file  Physical Activity: Not on file  Stress: Not on file  Social Connections: Not on file  Intimate Partner Violence: Not on file    SUBJECTIVE  Review of Systems Constitutional: Patient denies any unintentional weight loss or change in strength lntegumentary: Patient denies any rashes or pruritus Cardiovascular: Patient denies chest pain or syncope Respiratory: Patient denies shortness of breath Gastrointestinal: ***Patient denies nausea, vomiting, constipation, or diarrhea ***As per HPI Musculoskeletal: Patient denies muscle cramps or weakness Neurologic: Patient denies convulsions or seizures Allergic/Immunologic: Patient denies recent allergic reaction(s) Hematologic/Lymphatic: Patient denies bleeding tendencies Endocrine: Patient denies heat/cold intolerance  GU: As per HPI.  OBJECTIVE There were no vitals filed for this visit. There is no height or weight on file to calculate BMI.  Physical Examination Constitutional: No obvious distress; patient is non-toxic appearing  Cardiovascular: No visible lower extremity edema.  Respiratory: The patient does not have audible wheezing/stridor; respirations do  not appear labored  Gastrointestinal: Abdomen non-distended Musculoskeletal: Normal ROM of UEs  Skin: No obvious rashes/open sores  Neurologic: CN 2-12 grossly intact Psychiatric: Answered questions appropriately with normal affect  Hematologic/Lymphatic/Immunologic: No obvious bruises or sites of spontaneous bleeding  UA: ***negative ***positive for *** leukocytes, *** blood, ***nitrites Urine microscopy: *** WBC/hpf, *** RBC/hpf, *** bacteria ***glucosuria (secondary to ***Jardiance ***Farxiga use) ***otherwise unremarkable  PVR: *** ml  ASSESSMENT No diagnosis found.  ***We reviewed recent imaging results; ***awaiting radiology results, appears to have ***no acute findings per provider interpretation.  ***For stone prevention:  Advised adequate hydration and we discussed option to consider low oxalate diet given that calcium oxalate is the most common type of stone. Handout provided about stone prevention diet.  ***For recurrent stone formers: We discussed option to proceed with 24 hour urinalysis ([facility]) for metabolic stone evaluation, which may help with targeted recommendations for dietary I medication therapies for stone prevention. Patient elected to ***proceed/ ***hold off.  Will plan to follow up in ***6 months / ***1 year with ***KUB ***RUS for stone surveillance or sooner if needed.  Patient verbalized understanding of and agreement with current plan. All questions were answered.  PLAN Advised the following: Maintain adequate fluid intake daily. Drink citrus juice (lemon, lime or orange juice) routinely. Low oxalate diet. No follow-ups on file.  No orders of the defined types were placed in this encounter.   It has been explained that the patient is to follow regularly with their PCP in addition to all other providers involved in their care and to follow instructions provided by these respective offices. Patient advised to contact urology clinic if any urologic-pertaining questions, concerns, new symptoms or problems arise in the interim period.  There are no Patient Instructions on file for this visit.  Electronically signed by:  Lauraine KYM Oz, MSN, FNP-C, CUNP 02/23/2024 9:50 AM

## 2024-02-27 ENCOUNTER — Other Ambulatory Visit (HOSPITAL_COMMUNITY): Payer: Self-pay

## 2024-03-01 ENCOUNTER — Other Ambulatory Visit: Payer: Self-pay

## 2024-03-02 ENCOUNTER — Other Ambulatory Visit (HOSPITAL_COMMUNITY): Payer: Self-pay

## 2024-03-03 ENCOUNTER — Other Ambulatory Visit: Payer: Self-pay

## 2024-03-03 ENCOUNTER — Other Ambulatory Visit (HOSPITAL_COMMUNITY): Payer: Self-pay

## 2024-03-05 ENCOUNTER — Other Ambulatory Visit (HOSPITAL_COMMUNITY): Payer: Self-pay

## 2024-03-15 ENCOUNTER — Other Ambulatory Visit (HOSPITAL_COMMUNITY): Payer: Self-pay

## 2024-03-16 ENCOUNTER — Ambulatory Visit (HOSPITAL_COMMUNITY): Attending: Urology

## 2024-03-16 ENCOUNTER — Ambulatory Visit: Admitting: Urology

## 2024-03-18 ENCOUNTER — Other Ambulatory Visit (HOSPITAL_COMMUNITY): Payer: Self-pay

## 2024-03-22 ENCOUNTER — Other Ambulatory Visit (HOSPITAL_COMMUNITY): Payer: Self-pay

## 2024-04-02 ENCOUNTER — Other Ambulatory Visit (HOSPITAL_COMMUNITY): Payer: Self-pay

## 2024-04-08 ENCOUNTER — Ambulatory Visit: Admitting: Urology

## 2024-04-08 ENCOUNTER — Other Ambulatory Visit (HOSPITAL_COMMUNITY): Payer: Self-pay

## 2024-04-14 ENCOUNTER — Ambulatory Visit: Admitting: Urology

## 2024-04-15 ENCOUNTER — Other Ambulatory Visit: Payer: Self-pay | Admitting: Urology

## 2024-04-15 ENCOUNTER — Other Ambulatory Visit (HOSPITAL_COMMUNITY): Payer: Self-pay

## 2024-04-15 DIAGNOSIS — N200 Calculus of kidney: Secondary | ICD-10-CM

## 2024-04-15 DIAGNOSIS — R1032 Left lower quadrant pain: Secondary | ICD-10-CM

## 2024-04-15 DIAGNOSIS — N201 Calculus of ureter: Secondary | ICD-10-CM

## 2024-04-16 ENCOUNTER — Other Ambulatory Visit (HOSPITAL_COMMUNITY): Payer: Self-pay

## 2024-04-16 ENCOUNTER — Ambulatory Visit (HOSPITAL_COMMUNITY)
Admission: RE | Admit: 2024-04-16 | Discharge: 2024-04-16 | Disposition: A | Discharge status: Home / Self Care | Source: Ambulatory Visit | Attending: Urology | Admitting: Urology

## 2024-04-16 DIAGNOSIS — N200 Calculus of kidney: Secondary | ICD-10-CM | POA: Insufficient documentation

## 2024-04-16 DIAGNOSIS — N201 Calculus of ureter: Secondary | ICD-10-CM | POA: Diagnosis present

## 2024-04-16 DIAGNOSIS — R1032 Left lower quadrant pain: Secondary | ICD-10-CM | POA: Insufficient documentation

## 2024-04-20 ENCOUNTER — Other Ambulatory Visit (HOSPITAL_COMMUNITY): Payer: Self-pay

## 2024-04-20 ENCOUNTER — Other Ambulatory Visit: Payer: Self-pay

## 2024-04-20 ENCOUNTER — Other Ambulatory Visit: Payer: Self-pay | Admitting: Urology

## 2024-04-20 DIAGNOSIS — R1032 Left lower quadrant pain: Secondary | ICD-10-CM

## 2024-04-20 DIAGNOSIS — N200 Calculus of kidney: Secondary | ICD-10-CM

## 2024-04-20 DIAGNOSIS — N201 Calculus of ureter: Secondary | ICD-10-CM

## 2024-04-20 MED ORDER — MODAFINIL 100 MG PO TABS
100.0000 mg | ORAL_TABLET | Freq: Every morning | ORAL | 3 refills | Status: AC
  Filled 2024-04-20: qty 30, 15d supply, fill #0
  Filled 2024-05-12: qty 30, 15d supply, fill #1
  Filled 2024-05-24 – 2024-05-25 (×2): qty 30, 15d supply, fill #2
  Filled 2024-06-03 – 2024-06-08 (×2): qty 30, 15d supply, fill #3

## 2024-04-21 ENCOUNTER — Other Ambulatory Visit (HOSPITAL_COMMUNITY): Payer: Self-pay

## 2024-04-21 ENCOUNTER — Other Ambulatory Visit: Payer: Self-pay

## 2024-04-21 MED ORDER — MODAFINIL 100 MG PO TABS
100.0000 mg | ORAL_TABLET | Freq: Every morning | ORAL | 3 refills | Status: AC
  Filled 2024-04-21: qty 30, 15d supply, fill #0
  Filled 2024-05-20 – 2024-06-30 (×4): qty 30, 15d supply, fill #1
  Filled 2024-07-20: qty 30, 15d supply, fill #2
  Filled 2024-08-04: qty 30, 15d supply, fill #3

## 2024-04-22 ENCOUNTER — Other Ambulatory Visit (HOSPITAL_COMMUNITY): Payer: Self-pay

## 2024-04-22 ENCOUNTER — Ambulatory Visit: Admitting: Orthopedic Surgery

## 2024-04-27 ENCOUNTER — Other Ambulatory Visit: Payer: Self-pay

## 2024-04-27 ENCOUNTER — Other Ambulatory Visit (HOSPITAL_COMMUNITY): Payer: Self-pay

## 2024-04-27 MED ORDER — TAMSULOSIN HCL 0.4 MG PO CAPS
0.4000 mg | ORAL_CAPSULE | Freq: Every day | ORAL | 1 refills | Status: AC | PRN
  Filled 2024-04-27: qty 30, 30d supply, fill #0
  Filled 2024-05-25: qty 30, 30d supply, fill #1

## 2024-04-28 ENCOUNTER — Ambulatory Visit: Admitting: Orthopedic Surgery

## 2024-04-28 ENCOUNTER — Other Ambulatory Visit (HOSPITAL_COMMUNITY): Payer: Self-pay

## 2024-04-30 ENCOUNTER — Encounter: Payer: Self-pay | Admitting: Urology

## 2024-04-30 ENCOUNTER — Other Ambulatory Visit (HOSPITAL_COMMUNITY): Payer: Self-pay

## 2024-04-30 ENCOUNTER — Ambulatory Visit (INDEPENDENT_AMBULATORY_CARE_PROVIDER_SITE_OTHER): Admitting: Urology

## 2024-04-30 ENCOUNTER — Ambulatory Visit (HOSPITAL_COMMUNITY): Admission: RE | Admit: 2024-04-30 | Source: Ambulatory Visit

## 2024-04-30 VITALS — BP 116/71 | HR 80

## 2024-04-30 DIAGNOSIS — N200 Calculus of kidney: Secondary | ICD-10-CM | POA: Diagnosis not present

## 2024-04-30 NOTE — Progress Notes (Signed)
 04/30/2024 11:45 AM   Paul Sawyer Jul 16, 1971 [id number]  Referring provider: Carlette Benita Area, MD [address] [place],  [place] [address]  nephrolithiasis   HPI: [name] is a 53yo here for followup for nephrolithiasis. He has not passed his left ureteral calculus. KUB shows possible mid ureteral calculus. He has intermittent left flank pain. He has increased urinary urgency and frequency   PMH: Past Medical History:  Diagnosis Date   Arthritis    Atherosclerosis    Clotting disorder (HCC)    blood clot/dvt   History of kidney stones    PAD (peripheral artery disease) (HCC)    Varicose veins of both lower extremities     Surgical History: Past Surgical History:  Procedure Laterality Date   CHOLECYSTECTOMY  2004   EXTRACORPOREAL SHOCK WAVE LITHOTRIPSY Left 08/08/2020   Procedure: EXTRACORPOREAL SHOCK WAVE LITHOTRIPSY (ESWL);  Surgeon: Sherrilee Belvie CROME, MD;  Location: [facility];  Service: Urology;  Laterality: Left;   EXTRACORPOREAL SHOCK WAVE LITHOTRIPSY Left 08/21/2021   Procedure: EXTRACORPOREAL SHOCK WAVE LITHOTRIPSY (ESWL);  Surgeon: Sherrilee Belvie CROME, MD;  Location: [facility];  Service: Urology;  Laterality: Left;   GASTRIC BYPASS  2005    Home Medications:  Allergies as of 04/30/2024       Reactions   Ciprofloxacin    Altered mental state   Nsaids Nausea And Vomiting   Upset stomach         Medication List        Accurate as of April 30, 2024 11:45 AM. If you have any questions, ask your nurse or doctor.          aspirin  EC 81 MG tablet Take 1 tablet (81 mg total) by mouth daily. Swallow whole.   buprenorphine -naloxone  8-2 mg Subl SL tablet Commonly known as: SUBOXONE  Place 1 tablet under the tongue daily   Buprenorphine  HCl-Naloxone  HCl 8-2 MG Film Place 1 film under the tongue and allow to dissolve once a day   buprenorphine -naloxone  8-2 mg Subl SL tablet Commonly known as: SUBOXONE  Place 1 tablet under the  tongue daily.   buprenorphine -naloxone  8-2 mg Subl SL tablet Commonly known as: SUBOXONE  Dissolve 1 tablet by mouth daily   buprenorphine -naloxone  8-2 mg Subl SL tablet Commonly known as: SUBOXONE  Dissolve one tablet under the tongue daily.   buprenorphine -naloxone  8-2 mg Subl SL tablet Commonly known as: SUBOXONE  Place 1 tablet under the tongue daily.   buprenorphine -naloxone  8-2 mg Subl SL tablet Commonly known as: SUBOXONE  Place 0.5 tablets under the tongue daily.   cyclobenzaprine  10 MG tablet Commonly known as: FLEXERIL  Take 1 tablet (10 mg total) by mouth 3 (three) times daily as needed.   cyclobenzaprine  10 MG tablet Commonly known as: FLEXERIL  Take 1 tablet by mouth 3 times daily as needed.   cyclobenzaprine  5 MG tablet Commonly known as: FLEXERIL  Take 1 tablet (5 mg total) by mouth daily as needed.   cyclobenzaprine  5 MG tablet Commonly known as: FLEXERIL  Take 1 tablet (5 mg total) by mouth daily as needed.   modafinil  100 MG tablet Commonly known as: Provigil  Take 1-2 tablets (100-200 mg total) by mouth every morning.   modafinil  100 MG tablet Commonly known as: PROVIGIL  Take 1-2 tablets (100-200 mg total) by mouth in the morning.   modafinil  100 MG tablet Commonly known as: Provigil  Take 1-2 tablets (100-200 mg total) by mouth every morning.   morphine  15 MG tablet Commonly known as: MSIR Take 1 tablet by mouth every  4 hours as needed for severe pain.   multivitamin with minerals Tabs tablet Take 1 tablet by mouth daily.   omeprazole  20 MG capsule Commonly known as: PRILOSEC Take 1 capsule by mouth daily.   tamsulosin  0.4 MG Caps capsule Commonly known as: FLOMAX  Take 1 capsule by mouth daily as needed for stone symptoms. Advised to contact urology provider / request office visit if stone symptoms fail to resolve. Go to ER if symptoms become severe.        Allergies:  Allergies  Allergen Reactions   Ciprofloxacin     Altered mental state    Nsaids Nausea And Vomiting    Upset stomach     Family History: Family History  Problem Relation Age of Onset   Diabetes Father    Heart disease Father    Colon cancer Neg Hx    Esophageal cancer Neg Hx    Rectal cancer Neg Hx    Stomach cancer Neg Hx     Social History:  reports that he has never smoked. He has never used smokeless tobacco. He reports current alcohol use of about 1.0 standard drink of alcohol per week. He reports that he does not use drugs.  ROS: All other review of systems were reviewed and are negative except what is noted above in HPI  Physical Exam: BP 116/71   Pulse 80   Constitutional:  Alert and oriented, No acute distress. HEENT: [place] AT, moist mucus membranes.  Trachea midline, no masses. Cardiovascular: No clubbing, cyanosis, or edema. Respiratory: Normal respiratory effort, no increased work of breathing. GI: Abdomen is soft, nontender, nondistended, no abdominal masses GU: No CVA tenderness.  Lymph: No cervical or inguinal lymphadenopathy. Skin: No rashes, bruises or suspicious lesions. Neurologic: Grossly intact, no focal deficits, moving all 4 extremities. Psychiatric: Normal mood and affect.  Laboratory Data: Lab Results  Component Value Date   WBC 4.9 05/16/2021   HGB 15.3 05/16/2021   HCT 44.7 05/16/2021   MCV 91.4 05/16/2021   PLT 201 05/16/2021    Lab Results  Component Value Date   CREATININE 0.95 05/16/2021    No results found for: PSA  No results found for: TESTOSTERONE  No results found for: HGBA1C  Urinalysis    Component Value Date/Time   COLORURINE YELLOW 07/26/2020 2028   APPEARANCEUR Clear 02/11/2024 1344   LABSPEC 1.014 07/26/2020 2028   PHURINE 7.0 07/26/2020 2028   GLUCOSEU Negative 02/11/2024 1344   HGBUR MODERATE (A) 07/26/2020 2028   BILIRUBINUR Negative 02/11/2024 1344   KETONESUR NEGATIVE 07/26/2020 2028   PROTEINUR Negative 02/11/2024 1344   PROTEINUR NEGATIVE 07/26/2020 2028   NITRITE  Negative 02/11/2024 1344   NITRITE NEGATIVE 07/26/2020 2028   LEUKOCYTESUR Negative 02/11/2024 1344   LEUKOCYTESUR NEGATIVE 07/26/2020 2028    Lab Results  Component Value Date   LABMICR See below: 02/11/2024   WBCUA None seen 02/11/2024   LABEPIT 0-10 02/11/2024   MUCUS Present 08/10/2021   BACTERIA None seen 02/11/2024    Pertinent Imaging: KUB today: Images reviewed and discussed with the patient  Results for orders placed during the hospital encounter of 04/16/24  DG Abd 1 View  Narrative CLINICAL DATA:  Known left renal calculi  EXAM: DG ABDOMEN 1V  COMPARISON:  02/11/2024  FINDINGS: Scattered large and small bowel gas is noted. Mild retained fecal material in the colon is seen. Renal calculi are again noted on the left stable from the prior exam. No definitive ureteral stones are seen. No  bony abnormality is noted.  IMPRESSION: Stable left renal calculi.   Electronically Signed By: Oneil Devonshire M.D. On: 04/25/2024 02:16  Results for orders placed during the hospital encounter of 12/01/20  US  Venous Img Lower Bilateral (DVT)  Narrative CLINICAL DATA:  Bilateral leg pain and edema  EXAM: BILATERAL LOWER EXTREMITY VENOUS DOPPLER ULTRASOUND  TECHNIQUE: Gray-scale sonography with graded compression, as well as color Doppler and duplex ultrasound were performed to evaluate the lower extremity deep venous systems from the level of the common femoral vein and including the common femoral, femoral, profunda femoral, popliteal and calf veins including the posterior tibial, peroneal and gastrocnemius veins when visible. The superficial great saphenous vein was also interrogated. Spectral Doppler was utilized to evaluate flow at rest and with distal augmentation maneuvers in the common femoral, femoral and popliteal veins.  COMPARISON:  None.  FINDINGS: RIGHT LOWER EXTREMITY  Common Femoral Vein: No evidence of thrombus. Normal compressibility,  respiratory phasicity and response to augmentation.  Saphenofemoral Junction: No evidence of thrombus. Normal compressibility and flow on color Doppler imaging.  Profunda Femoral Vein: No evidence of thrombus. Normal compressibility and flow on color Doppler imaging.  Femoral Vein: No evidence of thrombus. Normal compressibility, respiratory phasicity and response to augmentation.  Popliteal Vein: No evidence of thrombus. Normal compressibility, respiratory phasicity and response to augmentation.  Calf Veins: No evidence of thrombus. Normal compressibility and flow on color Doppler imaging.  LEFT LOWER EXTREMITY  Common Femoral Vein: No evidence of thrombus. Normal compressibility, respiratory phasicity and response to augmentation.  Saphenofemoral Junction: No evidence of thrombus. Normal compressibility and flow on color Doppler imaging.  Profunda Femoral Vein: No evidence of thrombus. Normal compressibility and flow on color Doppler imaging.  Femoral Vein: No evidence of thrombus. Normal compressibility, respiratory phasicity and response to augmentation.  Popliteal Vein: No evidence of thrombus. Normal compressibility, respiratory phasicity and response to augmentation.  Calf Veins: No evidence of thrombus. Normal compressibility and flow on color Doppler imaging.  IMPRESSION: No evidence of deep venous thrombosis in either lower extremity.   Electronically Signed By: CHRISTELLA.  Shick M.D. On: 12/01/2020 15:30  No results found for this or any previous visit.  No results found for this or any previous visit.  Results for orders placed during the hospital encounter of 04/16/24  US  RENAL  Narrative CLINICAL DATA:  Initial evaluation for kidney stone.  EXAM: RENAL / URINARY TRACT ULTRASOUND COMPLETE  COMPARISON:  Prior study from 08/21/2021.  FINDINGS: Right Kidney:  Renal measurements: 11.7 x 4.0 x 4.7 cm = volume: 115.0 mL. Renal echogenicity within normal  limits. No nephrolithiasis or hydronephrosis. No focal renal mass.  Left Kidney:  Renal measurements: 11.0 x 6.4 x 5.9 cm = volume: 216.6 mL. Renal echogenicity within normal limits. 4 mm nonobstructive calculus present at the lower pole. Additional 8 mm calculus also present at the lower pole. No hydronephrosis. No focal renal mass.  Bladder:  Appears normal for degree of bladder distention.  Other:  None.  IMPRESSION: 1. Two nonobstructive calculi at the lower pole of the left kidney measuring up to 8 mm as above. No hydronephrosis. 2. Otherwise unremarkable renal ultrasound.   Electronically Signed By: Morene Hoard M.D. On: 04/20/2024 02:18  No results found for this or any previous visit.  No results found for this or any previous visit.  Results for orders placed during the hospital encounter of 07/26/20  CT Renal Stone Study  Narrative CLINICAL DATA:  Right flank pain, nephrolithiasis,  hematuria  EXAM: CT ABDOMEN AND PELVIS WITHOUT CONTRAST  TECHNIQUE: Multidetector CT imaging of the abdomen and pelvis was performed following the standard protocol without IV contrast.  COMPARISON:  None.  FINDINGS: Lower chest: Mild right basilar scarring. Mild right coronary artery calcification. Global cardiac size within normal limits. No pericardial effusion.  Hepatobiliary: Cholecystectomy has been performed. Liver unremarkable. No intrahepatic biliary ductal dilation. Mild extrahepatic biliary ductal dilation with the bile duct measuring 10 mm in diameter may represent post cholecystectomy change, but is nonspecific.  Pancreas: Unremarkable  Spleen: Unremarkable  Adrenals/Urinary Tract: The adrenal glands are unremarkable. The kidneys are normal in size and position. There are at least 4 nonobstructing calculi within the lower pole of the left kidney identified measuring up to 8 mm in greatest diameter. No hydronephrosis. No urolithiasis. No  significant perinephric stranding or perinephric fluid collections identified. The bladder is unremarkable.  Stomach/Bowel: Surgical changes of Roux-en-Y gastric bypass are identified. Moderate stool is seen throughout the colon. The stomach, small bowel, and large bowel are otherwise unremarkable there is no evidence of obstruction or focal inflammation. The appendix is normal. No free intraperitoneal gas or fluid.  Vascular/Lymphatic: Moderate aortoiliac atherosclerotic calcification. No aortic aneurysm. No pathologic adenopathy within the abdomen and pelvis.  Reproductive: Prostate is unremarkable.  Other: Rectum unremarkable.  No abdominal wall hernia identified.  Musculoskeletal: Mild to moderate lumbar levoscoliosis, apex left at L2. Degenerative changes are noted within the lumbar spine. No acute bone abnormality identified.  IMPRESSION: Nonobstructing left nephrolithiasis. No hydronephrosis. No urolithiasis.  Status post cholecystectomy. Mild dilation of the extrahepatic bile duct is nonspecific, but may represent post cholecystectomy change. Correlation with liver enzymes may be helpful to exclude an obstructive process.  No definite radiographic explanation for the patient's reported symptoms. No acute intra-abdominal pathology identified.  Aortic Atherosclerosis (ICD10-I70.0).   Electronically Signed By: Dorethia Molt MD On: 07/26/2020 21:00   Assessment & Plan:    1. Kidney stones (Primary) -STAT CT stone - Urinalysis, Routine w reflex microscopic - CT RENAL STONE STUDY   No follow-ups on file.  Belvie Clara, MD  [facility] [place]

## 2024-04-30 NOTE — Patient Instructions (Signed)

## 2024-05-03 ENCOUNTER — Telehealth: Payer: Self-pay

## 2024-05-03 ENCOUNTER — Other Ambulatory Visit (HOSPITAL_COMMUNITY): Payer: Self-pay

## 2024-05-03 NOTE — Telephone Encounter (Signed)
 Pt called stating he wanted to know what they were going to do about CT stone study due to it being not approved pt stated that he was advised a peer to peer is needed but wanted to know what would happen if it was not approved pt was advised that the woman who does the authorizations for them is currently not in however we will send her a message for an update pt was also advised a message would be sent to MD McKenzie in the event CT is not authorized pt stated MD McKenzie wanted CT to be done STAT and that he needs to know ahead of time when procedure (CT) will be completed so he can arrange transportation pt was advised once we get the CT approved someone from our office will reach out to schedule the CT for him ahead of time pt is worried because MD Wanted CT completed stat pt also stated that he is also nervous because he has had kidney stones before and does not want to go through that again he is still having lower left ache as well

## 2024-05-04 ENCOUNTER — Other Ambulatory Visit (HOSPITAL_COMMUNITY): Payer: Self-pay

## 2024-05-04 NOTE — Telephone Encounter (Signed)
 Peer to Peer to be completed by MD today at 4:30pm

## 2024-05-05 ENCOUNTER — Other Ambulatory Visit (HOSPITAL_COMMUNITY): Payer: Self-pay

## 2024-05-05 ENCOUNTER — Encounter (HOSPITAL_COMMUNITY): Payer: Self-pay

## 2024-05-05 ENCOUNTER — Other Ambulatory Visit: Payer: Self-pay

## 2024-05-05 NOTE — Progress Notes (Signed)
Created order

## 2024-05-06 ENCOUNTER — Ambulatory Visit (HOSPITAL_COMMUNITY)
Admission: RE | Admit: 2024-05-06 | Discharge: 2024-05-06 | Disposition: A | Discharge status: Home / Self Care | Source: Ambulatory Visit | Attending: Urology | Admitting: Urology

## 2024-05-06 DIAGNOSIS — N200 Calculus of kidney: Secondary | ICD-10-CM | POA: Diagnosis present

## 2024-05-10 ENCOUNTER — Other Ambulatory Visit: Payer: Self-pay

## 2024-05-10 ENCOUNTER — Telehealth: Payer: Self-pay | Admitting: Urology

## 2024-05-10 ENCOUNTER — Other Ambulatory Visit (HOSPITAL_COMMUNITY): Payer: Self-pay

## 2024-05-10 MED ORDER — BUPRENORPHINE HCL-NALOXONE HCL 8-2 MG SL SUBL
0.5000 | SUBLINGUAL_TABLET | Freq: Every day | SUBLINGUAL | 2 refills | Status: AC
  Filled 2024-05-10: qty 15, 30d supply, fill #0
  Filled 2024-05-20 – 2024-06-08 (×4): qty 15, 30d supply, fill #1
  Filled 2024-06-30 – 2024-07-09 (×2): qty 15, 30d supply, fill #2
  Filled ????-??-??: fill #2

## 2024-05-10 NOTE — Telephone Encounter (Signed)
 Patient returned call requesting CT results.   Test was completed on 05/06/2024.  Would like to have litho tomorrow if possible  Best number to contact patient [phone] Call transferred to clinical basket.

## 2024-05-10 NOTE — Telephone Encounter (Signed)
 Return call to patient. Pt is made aware CT Scan routed to Dr. Sherrilee for review and once review someone will reach out with MD recommendation. Verbalized understanding

## 2024-05-11 NOTE — Telephone Encounter (Signed)
 Pt is made aware and  verbalized understanding  Per Dr. Sherrilee CT showed no ureteral stones and only a couple tiny renal stones  Pt want to proceed with litho to remove the couple of tiny renal stones.

## 2024-05-12 ENCOUNTER — Other Ambulatory Visit (HOSPITAL_COMMUNITY): Payer: Self-pay

## 2024-05-12 ENCOUNTER — Other Ambulatory Visit: Payer: Self-pay

## 2024-05-13 NOTE — Telephone Encounter (Signed)
 Pt called to inquired about litho. Pt is aware I will address this with Dr. Sherrilee first thing in the morning. Verbalized understanding

## 2024-05-14 ENCOUNTER — Telehealth: Payer: Self-pay

## 2024-05-14 DIAGNOSIS — N200 Calculus of kidney: Secondary | ICD-10-CM

## 2024-05-14 NOTE — Telephone Encounter (Signed)
 Pt is made to get an KUB to determined if litho is needed. Verbalized understanding.

## 2024-05-18 NOTE — Telephone Encounter (Signed)
 Per Dr. Sherrilee The stones are very small and would not be amenable to treatment with ESWL. He can have ureteroscopy to remiove the calculis.  Pt was made aware and verbalized understanding

## 2024-05-20 ENCOUNTER — Telehealth: Payer: Self-pay

## 2024-05-20 ENCOUNTER — Other Ambulatory Visit (HOSPITAL_COMMUNITY): Payer: Self-pay

## 2024-05-20 NOTE — Telephone Encounter (Signed)
 It doesn't look like you were informed that patient did want to move forward with ureteroscopy.  Please add surgery request.  Patient also states he discussed a urolift with you, he is asking if you can do both surgeries at the same time?  Per patient, he is nervous to go under general anesthesia for surgery and would prefer to only have one surgery vs two separate.

## 2024-05-21 ENCOUNTER — Ambulatory Visit: Admitting: Orthopedic Surgery

## 2024-05-21 ENCOUNTER — Other Ambulatory Visit: Payer: Self-pay | Admitting: Urology

## 2024-05-21 DIAGNOSIS — N200 Calculus of kidney: Secondary | ICD-10-CM

## 2024-05-21 NOTE — Telephone Encounter (Signed)
 Patient is aware of MD response and voiced understanding. Surgery request was submitted.

## 2024-05-24 ENCOUNTER — Other Ambulatory Visit (HOSPITAL_COMMUNITY): Payer: Self-pay

## 2024-05-25 ENCOUNTER — Other Ambulatory Visit: Payer: Self-pay

## 2024-05-25 ENCOUNTER — Other Ambulatory Visit (HOSPITAL_COMMUNITY): Payer: Self-pay

## 2024-06-03 ENCOUNTER — Other Ambulatory Visit (HOSPITAL_COMMUNITY): Payer: Self-pay

## 2024-06-04 ENCOUNTER — Telehealth: Payer: Self-pay | Admitting: Internal Medicine

## 2024-06-04 NOTE — Telephone Encounter (Signed)
 Pt c/o Shortness Of Breath: STAT if SOB developed within the last 24 hours or pt is noticeably SOB on the phone  1. Are you currently SOB (can you hear that pt is SOB on the phone)? No  2. How long have you been experiencing SOB? About 5 days   3. Are you SOB when sitting or when up moving around? Most of the time when moving around but both   4. Are you currently experiencing any other symptoms? No.

## 2024-06-04 NOTE — Telephone Encounter (Signed)
Attempt to reach, mailbox full,unable to leave message

## 2024-06-04 NOTE — Telephone Encounter (Signed)
 Patient tells me for the past 3 days he has head a clear change with his breathing and has SOB even while just standing and he notes his HR is elevated.  He endorses normally walking on treadmill at 3.5 mph, he now is reduced to 2 mph with HR 130 bpm.He has chronic swelling in his lower extremities and does not have calf pain.  He has a history of DVT and does not take ASA any longer to to GI distress.  He denies CP at this time.  I advised him to go to the ED now for evaluation.He agrees to seek medical attention now.   I will FYI Dr.Mallipeddi.

## 2024-06-05 ENCOUNTER — Encounter (HOSPITAL_COMMUNITY): Payer: Self-pay | Admitting: *Deleted

## 2024-06-05 ENCOUNTER — Emergency Department (HOSPITAL_COMMUNITY)

## 2024-06-05 ENCOUNTER — Other Ambulatory Visit: Payer: Self-pay

## 2024-06-05 ENCOUNTER — Emergency Department (HOSPITAL_COMMUNITY): Admission: EM | Admit: 2024-06-05 | Discharge: 2024-06-05 | Disposition: A | Discharge status: Home / Self Care

## 2024-06-05 DIAGNOSIS — R0602 Shortness of breath: Secondary | ICD-10-CM | POA: Diagnosis present

## 2024-06-05 DIAGNOSIS — I2510 Atherosclerotic heart disease of native coronary artery without angina pectoris: Secondary | ICD-10-CM | POA: Diagnosis not present

## 2024-06-05 DIAGNOSIS — M7989 Other specified soft tissue disorders: Secondary | ICD-10-CM | POA: Diagnosis not present

## 2024-06-05 DIAGNOSIS — R000 Tachycardia, unspecified: Secondary | ICD-10-CM | POA: Insufficient documentation

## 2024-06-05 LAB — CBC
HCT: 41.1 % (ref 39.0–52.0)
Hemoglobin: 13.7 g/dL (ref 13.0–17.0)
MCH: 31.6 pg (ref 26.0–34.0)
MCHC: 33.3 g/dL (ref 30.0–36.0)
MCV: 94.9 fL (ref 80.0–100.0)
Platelets: 202 K/uL (ref 150–400)
RBC: 4.33 MIL/uL (ref 4.22–5.81)
RDW: 12.6 % (ref 11.5–15.5)
WBC: 4.9 K/uL (ref 4.0–10.5)
nRBC: 0 % (ref 0.0–0.2)

## 2024-06-05 LAB — COMPREHENSIVE METABOLIC PANEL WITH GFR
ALT: 14 U/L (ref 0–44)
AST: 25 U/L (ref 15–41)
Albumin: 4.3 g/dL (ref 3.5–5.0)
Alkaline Phosphatase: 80 U/L (ref 38–126)
Anion gap: 10 (ref 5–15)
BUN: 12 mg/dL (ref 6–20)
CO2: 26 mmol/L (ref 22–32)
Calcium: 9.1 mg/dL (ref 8.9–10.3)
Chloride: 104 mmol/L (ref 98–111)
Creatinine, Ser: 0.85 mg/dL (ref 0.61–1.24)
GFR, Estimated: >60 mL/min (ref >60)
Glucose, Bld: 94 mg/dL (ref 70–99)
Potassium: 4.2 mmol/L (ref 3.5–5.1)
Sodium: 140 mmol/L (ref 135–145)
Total Bilirubin: 1.1 mg/dL (ref 0.0–1.2)
Total Protein: 6.7 g/dL (ref 6.5–8.1)

## 2024-06-05 LAB — TROPONIN T, HIGH SENSITIVITY
Troponin T High Sensitivity: <15 ng/L (ref 0–19)
Troponin T High Sensitivity: <15 ng/L (ref 0–19)

## 2024-06-05 LAB — PRO BRAIN NATRIURETIC PEPTIDE: Pro Brain Natriuretic Peptide: 112 pg/mL (ref <300.0)

## 2024-06-05 MED ORDER — IOHEXOL 350 MG/ML SOLN
75.0000 mL | Freq: Once | INTRAVENOUS | Status: AC | PRN
  Administered 2024-06-05: 75 mL via INTRAVENOUS

## 2024-06-05 NOTE — ED Notes (Signed)
 Pt/family received d/c paperwork at this time. After going over the paperwork any questions, comments, or concerns were answered to the best of this nurse's knowledge. The pt/family verbally acknowledged the teachings/instructions.

## 2024-06-05 NOTE — ED Triage Notes (Signed)
 Pt with C/o SOB x 3 days. Pt with hx of DVT. Pt treadmill and noted his HR elevated 130-140's going at 2.0 mph on treadmill. Edema to lower legs are little worse recently.

## 2024-06-05 NOTE — Discharge Instructions (Addendum)
 IMPORTANT PATIENT INSTRUCTIONS:  Your ED provider has recommended an Outpatient Ultrasound.  Please call [phone] to schedule an appointment.  They have you scheduled for 9 am tomorrow. I think this is reasonable to do.   Give your cardiologist call.  Your CTA of your chest showed no PE.  Your cardiac enzymes were unremarkable.  I do think you should follow back up with a cardiologist given this exertional shortness of breath.  If you start have any kind of worsening exertional shortness of breath or chest pain then please come back to the ED for further care.

## 2024-06-05 NOTE — ED Provider Notes (Signed)
 [place] EMERGENCY DEPARTMENT AT [facility] Provider Note   CSN: [id number] Arrival date & time: 06/05/24  8283     Patient presents with: Shortness of Breath   Paul Sawyer is a 53 y.o. male.    Shortness of Breath   Patient presents because of shortness of breath.  Patient states that been having increasing shortness of breath over the past 3 to 4 days.  Patient states that he has noted some increase in exertional shortness of breath.  No exertional chest pain.  No chest pain at rest at this moment of time.  No clear chest pain or hemoptysis.  Does endorse a history of DVT.  Patient is currently not on anticoagulation.  States that both his DVTs were provoked in the past.  Patient endorses swelling bilaterally of the lower extremities which is chronic for him in the setting of his venous insufficiency.  Patient states that he became concerned last night so took 10 mg of Eliquis that he had leftover from previous DVT because he was concerned about possible PE.  Patient denies any stent history.  Denies any orthopnea.   Previous medical history reviewed : Patient last seen in ED 05/16/21 due to chest pain. Negative workup. Follows with cardiology. Last saw in May of 2024.  mild CAD, history of DVT, gastric bypass, chronic venous insufficiency s/p L GSV ablation.  He underwent CT cardiac that showed coronary calcium score of 808 (99th percentile for age, sex and race matched controls) but has mild nonobstructive CAD based on FFR.  Echocardiogram showed normal LVEF and no valve abnormalities.        Prior to Admission medications   Medication Sig Start Date End Date Taking? Authorizing Provider  buprenorphine -naloxone  (SUBOXONE ) 8-2 mg SUBL SL tablet Place 0.5 tablets under the tongue daily. 05/10/24  Yes   modafinil  (PROVIGIL ) 100 MG tablet Take 1-2 tablets (100-200 mg total) by mouth every morning. 04/21/24  Yes   Multiple Vitamin (MULTIVITAMIN WITH MINERALS) TABS tablet  Take 1 tablet by mouth daily.   Yes [provider]  tamsulosin  (FLOMAX ) 0.4 MG CAPS capsule Take 1 capsule by mouth daily as needed for stone symptoms. Advised to contact urology provider / request office visit if stone symptoms fail to resolve. Go to ER if symptoms become severe. 04/27/24  Yes McKenzie, Belvie CROME, MD  modafinil  (PROVIGIL ) 100 MG tablet Take 1-2 tablets (100-200 mg total) by mouth in the morning. Patient not taking: Reported on 06/05/2024 01/29/24       Allergies: Ciprofloxacin and Nsaids    Review of Systems  Respiratory:  Positive for shortness of breath.     Updated Vital Signs BP (!) 116/104   Pulse 79   Temp 98.3 F (36.8 C)   Resp 15   Ht 6' 4 (1.93 m)   Wt 95.3 kg   SpO2 96%   BMI 25.56 kg/m   Physical Exam Vitals and nursing note reviewed.  Constitutional:      General: He is not in acute distress.    Appearance: He is well-developed.  HENT:     Head: Normocephalic and atraumatic.  Eyes:     Conjunctiva/sclera: Conjunctivae normal.  Cardiovascular:     Rate and Rhythm: Normal rate and regular rhythm.     Heart sounds: No murmur heard. Pulmonary:     Effort: Pulmonary effort is normal. No respiratory distress.     Breath sounds: Normal breath sounds.  Abdominal:     Palpations:  Abdomen is soft.     Tenderness: There is no abdominal tenderness.  Musculoskeletal:        General: No swelling.     Cervical back: Neck supple.  Skin:    General: Skin is warm and dry.     Capillary Refill: Capillary refill takes less than 2 seconds.  Neurological:     Mental Status: He is alert.  Psychiatric:        Mood and Affect: Mood normal.     (all labs ordered are listed, but only abnormal results are displayed) Labs Reviewed  CBC  PRO BRAIN NATRIURETIC PEPTIDE  COMPREHENSIVE METABOLIC PANEL WITH GFR  TROPONIN T, HIGH SENSITIVITY  TROPONIN T, HIGH SENSITIVITY    EKG: None  Radiology: CT Angio Chest PE W and/or Wo Contrast Result  Date: 06/05/2024 EXAM: CTA of the Chest with contrast for PE 06/05/2024 06:44:39 PM TECHNIQUE: CTA of the chest was performed after the administration of 75 mL of iohexol  (OMNIPAQUE ) 350 MG/ML injection. Multiplanar reformatted images are provided for review. MIP images are provided for review. Automated exposure control, iterative reconstruction, and/or weight based adjustment of the mA/kV was utilized to reduce the radiation dose to as low as reasonably achievable. COMPARISON: CT angiogram chest 05/16/2024. CT renal stone protocol 07/27/1999 and 07/26/2020. CLINICAL HISTORY: Rule out PE. SOB. DVT history. C/o SOB x 3 days. Pt with hx of DVT. FINDINGS: PULMONARY ARTERIES: Pulmonary arteries are adequately opacified for evaluation. No pulmonary embolism. Main pulmonary artery is normal in caliber. MEDIASTINUM: The heart and pericardium demonstrate no acute abnormality. There is no acute abnormality of the thoracic aorta. LYMPH NODES: No mediastinal, hilar or axillary lymphadenopathy. LUNGS AND PLEURA: Nodular area of scarring in the right lower lobe is unchanged from 2021. No focal consolidation or pulmonary edema. No pleural effusion or pneumothorax. UPPER ABDOMEN: Limited images of the upper abdomen are unremarkable. SOFT TISSUES AND BONES: No acute bone or soft tissue abnormality. IMPRESSION: 1. No pulmonary embolism. 2. Unchanged 9 mm solid right lower lobe nodular scar, stable since 2021. Given stability over >2 years, no further routine follow-up is needed per Fleischner Society Guidelines. 3. Additional scattered solid pulmonary nodules 3 mm or less in the right middle lobe and bilateral lower lobes, unchanged; no routine follow-up is recommended per Fleischner Society Guidelines. Electronically signed by: Greig Pique MD 06/05/2024 07:09 PM EDT RP Workstation: [id number]   DG Chest Port 1 View Result Date: 06/05/2024 CLINICAL DATA:  Shortness of breath. EXAM: PORTABLE CHEST 1 VIEW COMPARISON:   11/14/2020 FINDINGS: The heart is normal in size. Mild diffuse peribronchial and interstitial thickening. No confluent opacity. No pneumothorax or large pleural effusion. Broad-based scoliotic curvature. Surgical clips in the upper abdomen. IMPRESSION: Mild diffuse peribronchial and interstitial thickening, may be bronchitis or pulmonary edema. Electronically Signed   By: Andrea Gasman M.D.   On: 06/05/2024 18:02     Procedures   Medications Ordered in the ED  iohexol  (OMNIPAQUE ) 350 MG/ML injection 75 mL (75 mLs Intravenous Contrast Given 06/05/24 1835)                                    Medical Decision Making Amount and/or Complexity of Data Reviewed Labs: ordered. Radiology: ordered.  Risk Prescription drug management.     HPI:  Patient presents because of shortness of breath.  Patient states that been having increasing shortness of breath over the past 3 to  4 days.  Patient states that he has noted some increase in exertional shortness of breath.  No exertional chest pain.  No chest pain at rest at this moment of time.  No clear chest pain or hemoptysis.  Does endorse a history of DVT.  Patient is currently not on anticoagulation.  States that both his DVTs were provoked in the past.  Patient endorses swelling bilaterally of the lower extremities which is chronic for him in the setting of his venous insufficiency.  Patient states that he became concerned last night so took 10 mg of Eliquis that he had leftover from previous DVT because he was concerned about possible PE.  Patient denies any stent history.  Denies any orthopnea.   Previous medical history reviewed : Patient last seen in ED 05/16/21 due to chest pain. Negative workup. Follows with cardiology. Last saw in May of 2024.  mild CAD, history of DVT, gastric bypass, chronic venous insufficiency s/p L GSV ablation.  He underwent CT cardiac that showed coronary calcium score of 808 (99th percentile for age, sex and race  matched controls) but has mild nonobstructive CAD based on FFR.  Echocardiogram showed normal LVEF and no valve abnormalities.   MDM:   Upon exam, patient hemodynamically stable.  ANO x 3 GCS 15.  No focal deficits.  Vital signs stable.  No tachypnea or tachycardia.  O2 saturation 99%.  Maps appropriate.  Given history of DVT and shortness of breath, will obtain PE workup including CTA of the chest.  Will not obtain D-dimer given I think patient is high enough risk to proceed with CTA chest before screening with D-dimer.  No chest pain.  Obtain EKG as well as ACS workup.  Patient has chronic swelling of bilateral lower legs.  Do not think this is any evidence of hypervolemia in the setting of CHF but rather more associated with his venous insufficiency.  Will still obtain BNP.  Lung sounds were normal to me bilaterally.    Reevaluation:   Upon reexamination, patient hemodynamically stable.  Remains A and O x 3 with GCS 15.  EKG x 2 showed no evidence of STEMI arrhythmia.  Opponent x 2 unremarkable.  Undetectable.  CT of the chest showed no evidence of PE.  No infiltrate.  No pneumonia.  No pneumothorax. Patient on room air without any kind of tachypnea or tachycardia.   I do think he should follow back up with cardiology.  I think he would benefit from likely repeat echo.  No obvious murmur on exam that would be new for him.  Cautioned the patient that he should come back to the ED if he has any kind of exertional chest pain.  Will give his cardiologist call.  DVT ultrasound ordered for outpatient tomorrow at 9 AM.  I think the swelling that he has in the lower extremities is more likely to be due to his chronic venous insufficiency.  Will still rule out DVT in the setting of his history of DVT.   EKG Interpreted by Me: NSR   Cardiac Tele Interpreted by Me: NS   I have independently interpreted the CXR  and CT  images and agree with the radiologist finding   Social Determinant  of Health: None    Disposition and Follow Up: Cardiology      Final diagnoses:  SOB (shortness of breath)  Tachycardia    ED Discharge Orders          Ordered    US  Venous  Img Lower Bilateral        06/05/24 2051               Simon Lavonia SAILOR, MD 06/05/24 2115

## 2024-06-06 ENCOUNTER — Emergency Department (HOSPITAL_COMMUNITY): Admit: 2024-06-06

## 2024-06-07 NOTE — Progress Notes (Signed)
 "  New Patient Pulmonology Office Visit   Subjective:  Patient ID: Paul Sawyer, male    DOB: 22-Aug-1970  MRN: [id number]  Referred by: Carlette Benita Area*  CC:  Chief Complaint  Patient presents with   Establish Care   Sleep Apnea    Stops breathing when sleep  States he is shob     HPI Paul Sawyer is a 53 y.o. male with opioid use disorder on bupernorphine who presents for initial evaluation of sleep disordered breathing.  Discussed the use of AI scribe software for clinical note transcription with the patient, who gave verbal consent to proceed.  History of Present Illness Paul Sawyer is a 53 year old male who presents with worsening sleep apnea and shortness of breath.  He has experienced worsening sleep apnea over the past couple of years, with episodes of apnea that wake him up, sometimes accompanied by coughing. His wife has observed him stop breathing for 10-15 seconds during sleep, and he occasionally snores when very tired. He typically goes to bed between 10 PM and 1 AM and wakes up around 7-8 AM, with fragmented sleep due to nocturia occurring twice per night. He feels tired in the morning and sometimes naps during the day, especially in the evening while watching TV. He has not been previously diagnosed with sleep apnea nor used a CPAP machine, although a sleep study in 2000 indicated some events.  He reports acute shortness of breath that began last Thursday, prompting an emergency department visit. The shortness of breath occurs without exertion and is accompanied by a nonproductive cough and rhinorrhea, but no sore throat, earache, fever, chills, or night sweats. He experienced dizziness on the treadmill when his pulse reached 140 bpm at a low speed. He denies any history of asthma or significant lung disease, although he has a history of a pulmonary embolism and deep vein thrombosis in the past, which were treated with anticoagulation for a period. He is  not currently on blood thinners.  His past medical history includes venous insufficiency with significant leg swelling. He had an echocardiogram about 1.5 to 2 years ago, which was normal, and he is due for a follow-up. He was previously prediabetic but has managed to maintain good blood sugar levels post-weight loss. He has no known drug allergies but cannot tolerate Cipro, aspirin , or NSAIDs.  He is currently taking Suboxone  (buprenorphine ) up to 8 mg daily for back pain and modafinil  to help with alertness. He has been on Suboxone  for 15 years, initially for morphine  replacement due to back pain. He does not smoke and has a history of secondhand smoke exposure during childhood. 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 His family history includes COPD in his mother, who was a smoker.     06/08/2024    2:00 PM  Results of the Epworth flowsheet  Sitting and reading 3  Watching TV 2  Sitting, inactive in a public place (e.g. a theatre or a meeting) 2  As a passenger in a car for an hour without a break 2  Lying down to rest in the afternoon when circumstances permit 3  Sitting and talking to someone 0  Sitting quietly after a lunch without alcohol 1  In a car, while stopped for a few minutes in traffic 0  Total score 13   STOP BANG: 4/8 ROS  Allergies: Ciprofloxacin and Nsaids  Current Outpatient Medications:    albuterol  (VENTOLIN  HFA)  108 (90 Base) MCG/ACT inhaler, Inhale 2 puffs into the lungs every 6 (six) hours as needed for wheezing or shortness of breath., Disp: 6.7 g, Rfl: 6   buprenorphine -naloxone  (SUBOXONE ) 8-2 mg SUBL SL tablet, Place 0.5 tablets under the tongue daily., Disp: 15 tablet, Rfl: 2   modafinil  (PROVIGIL ) 100 MG tablet, Take 1-2 tablets (100-200 mg total) by mouth in the morning., Disp: 30 tablet, Rfl: 3   modafinil  (PROVIGIL ) 100 MG tablet, Take 1-2 tablets (100-200 mg total) by mouth every morning., Disp: 30 tablet, Rfl: 3    Multiple Vitamin (MULTIVITAMIN WITH MINERALS) TABS tablet, Take 1 tablet by mouth daily., Disp: , Rfl:    tamsulosin  (FLOMAX ) 0.4 MG CAPS capsule, Take 1 capsule by mouth daily as needed for stone symptoms. Advised to contact urology provider / request office visit if stone symptoms fail to resolve. Go to ER if symptoms become severe., Disp: 30 capsule, Rfl: 1 Past Medical History:  Diagnosis Date   Arthritis    Atherosclerosis    Clotting disorder    blood clot/dvt   History of kidney stones    PAD (peripheral artery disease)    Varicose veins of both lower extremities    Past Surgical History:  Procedure Laterality Date   CHOLECYSTECTOMY  2004   EXTRACORPOREAL SHOCK WAVE LITHOTRIPSY Left 08/08/2020   Procedure: EXTRACORPOREAL SHOCK WAVE LITHOTRIPSY (ESWL);  Surgeon: Sherrilee Belvie CROME, MD;  Location: [facility];  Service: Urology;  Laterality: Left;   EXTRACORPOREAL SHOCK WAVE LITHOTRIPSY Left 08/21/2021   Procedure: EXTRACORPOREAL SHOCK WAVE LITHOTRIPSY (ESWL);  Surgeon: Sherrilee Belvie CROME, MD;  Location: [facility];  Service: Urology;  Laterality: Left;   GASTRIC BYPASS  2005   Family History  Problem Relation Age of Onset   Diabetes Father    Heart disease Father    Colon cancer Neg Hx    Esophageal cancer Neg Hx    Rectal cancer Neg Hx    Stomach cancer Neg Hx    Social History   Socioeconomic History   Marital status: Married    Spouse name: Not on file   Number of children: Not on file   Years of education: Not on file   Highest education level: Not on file  Occupational History   Not on file  Tobacco Use   Smoking status: Never   Smokeless tobacco: Never  Vaping Use   Vaping status: Never Used  Substance and Sexual Activity   Alcohol use: Yes    Alcohol/week: 1.0 standard drink of alcohol    Types: 1 Glasses of wine per week    Comment: weekly   Drug use: No   Sexual activity: Yes  Other Topics Concern   Not on file  Social History Narrative   Not on file    Social Drivers of Health   Financial Resource Strain: Not on file  Food Insecurity: Not on file  Transportation Needs: Not on file  Physical Activity: Not on file  Stress: Not on file  Social Connections: Not on file  Intimate Partner Violence: Not on file       Objective:  BP 109/64   Pulse 76   Ht 6' 4 (1.93 m)   Wt 217 lb (98.4 kg)   SpO2 98% Comment: ra  BMI 26.41 kg/m  Wt Readings from Last 3 Encounters:  06/08/24 217 lb (98.4 kg)  06/05/24 210 lb (95.3 kg)  12/13/22 204 lb 3.2 oz (92.6 kg)   BMI Readings from Last 3 Encounters:  06/08/24 26.41 kg/m  06/05/24 25.56 kg/m  12/13/22 25.52 kg/m   SpO2 Readings from Last 3 Encounters:  06/08/24 98%  06/05/24 96%  12/13/22 100%   Physical Exam General: NAD, alert, thin, bitemporal wasting Eyes: PERRL, no scleral icterus ENMT: oropharynx clear, good dentition, no oral lesions, mallampati score I Skin: warm, intact, no rashes Neck: JVD flat, ROM and lymph node assessment normal, neck circ 14 inches CV: RRR, no MRG, nl S1 and S2, no peripheral edema Resp: clear to auscultation bilaterally, no wheezes, rales, or rhonchi, normal effort, no clubbing/cyanosis Neuro: Awake alert oriented to person place time and situation  Diagnostic Review:  Last CBC Lab Results  Component Value Date   WBC 4.9 06/05/2024   HGB 13.7 06/05/2024   HCT 41.1 06/05/2024   MCV 94.9 06/05/2024   MCH 31.6 06/05/2024   RDW 12.6 06/05/2024   PLT 202 06/05/2024   Last metabolic panel Lab Results  Component Value Date   GLUCOSE 94 06/05/2024   NA 140 06/05/2024   K 4.2 06/05/2024   CL 104 06/05/2024   CO2 26 06/05/2024   BUN 12 06/05/2024   CREATININE 0.85 06/05/2024   GFRNONAA >60 06/05/2024   CALCIUM 9.1 06/05/2024   PROT 6.7 06/05/2024   ALBUMIN 4.3 06/05/2024   BILITOT 1.1 06/05/2024   ALKPHOS 80 06/05/2024   AST 25 06/05/2024   ALT 14 06/05/2024   ANIONGAP 10 06/05/2024    CTA Chest 06/05/2024: IMPRESSION: 1. No  pulmonary embolism. 2. Unchanged 9 mm solid right lower lobe nodular scar, stable since 2021. Given stability over >2 years, no further routine follow-up is needed per Fleischner Society Guidelines. 3. Additional scattered solid pulmonary nodules 3 mm or less in the right middle lobe and bilateral lower lobes, unchanged; no routine follow-up is recommended per Fleischner Society Guidelines.  Echo 06/22/2021: 1. Left ventricular ejection fraction, by estimation, is 60 to 65%. The  left ventricle has normal function. The left ventricle has no regional  wall motion abnormalities. Left ventricular diastolic parameters were  normal.   2. Right ventricular systolic function is normal. The right ventricular  size is normal. Tricuspid regurgitation signal is inadequate for assessing  PA pressure.   3. The mitral valve is normal in structure. No evidence of mitral valve  regurgitation. No evidence of mitral stenosis.   4. The aortic valve was not well visualized. There is mild calcification  of the aortic valve. There is mild thickening of the aortic valve. Aortic  valve regurgitation is not visualized. No aortic stenosis is present.     Assessment & Plan:   Assessment & Plan Hypersomnia with sleep apnea Suspected obstructive sleep apnea Episodes of apnea, snoring, and daytime fatigue suggest obstructive sleep apnea. ESS is 13, STOP BANG 4. Consider central sleep apnea due to buprenorphine  use - Order in-lab sleep study to evaluate for obstructive and central sleep apnea. - Discuss cutting down on buprenorphine  with original prescriber - FU after sleep study to discuss treatments Dyspnea on exertion Symptoms suggest viral LRTI; differential includes asthma or COPD. Recent ED visit ruled out serious conditions including PE, pneumonia, pneumothorax, MI, anemia, CTEPH. - Prescribe albuterol  inhaler, two puffs every four hours as needed for shortness of breath. - Order pulmonary function test to  evaluate for obstructive or restrictive lung disease.  Assessment & Plan Chronic lower extremity edema due to venous and lymphatic insufficiency Chronic leg swelling with venous insufficiency and suspected lymphatic involvement. - Recommend follow-up with cardiologist for repeat  echocardiogram.  History of pulmonary embolism and deep vein thrombosis Two clotting events, one provoked by due to acute infectious process and another by surgery. Discussed risk of future events and potential need for lifelong anticoagulation. - Advise monitoring for signs of new clotting events. - Discuss potential for lifelong anticoagulation if a third clotting event occurs.  Orders Placed This Encounter  Procedures   Pulmonary function test   Split night study   I spent 45 minutes reviewing patient's chart including prior consultant notes, imaging, and PFTs as well as face-to-face with the patient, over half in discussion of the diagnosis and the importance of compliance with the treatment plan.  Return in about 2 months (around 08/08/2024).   Jasmin Trumbull, MD "

## 2024-06-08 ENCOUNTER — Ambulatory Visit: Admitting: Pulmonary Disease

## 2024-06-08 ENCOUNTER — Other Ambulatory Visit: Payer: Self-pay

## 2024-06-08 ENCOUNTER — Encounter: Payer: Self-pay | Admitting: Pulmonary Disease

## 2024-06-08 ENCOUNTER — Other Ambulatory Visit (HOSPITAL_COMMUNITY): Payer: Self-pay

## 2024-06-08 VITALS — BP 109/64 | HR 76 | Ht 76.0 in | Wt 217.0 lb

## 2024-06-08 DIAGNOSIS — R0609 Other forms of dyspnea: Secondary | ICD-10-CM

## 2024-06-08 DIAGNOSIS — G4730 Sleep apnea, unspecified: Secondary | ICD-10-CM | POA: Diagnosis not present

## 2024-06-08 DIAGNOSIS — G4710 Hypersomnia, unspecified: Secondary | ICD-10-CM

## 2024-06-08 MED ORDER — ALBUTEROL SULFATE HFA 108 (90 BASE) MCG/ACT IN AERS
2.0000 | INHALATION_SPRAY | Freq: Four times a day (QID) | RESPIRATORY_TRACT | 6 refills | Status: AC | PRN
  Filled 2024-06-08: qty 6.7, 25d supply, fill #0
  Filled 2024-06-11: qty 6.7, 25d supply, fill #1

## 2024-06-08 NOTE — Patient Instructions (Signed)
  VISIT SUMMARY: During your visit, we discussed your worsening sleep apnea, recent shortness of breath, chronic leg swelling, history of blood clots, and chronic back pain. We have planned several tests and follow-ups to address these issues.  YOUR PLAN: SUSPECTED OBSTRUCTIVE SLEEP APNEA: You have been experiencing episodes of apnea, snoring, and daytime fatigue, which suggest obstructive sleep apnea. There is also a possibility of central sleep apnea due to your medication. -We will schedule an in-lab sleep study to evaluate for both obstructive and central sleep apnea. -Continue your current buprenorphine  regimen until we have the sleep study results. -Based on the sleep study results, we may discuss the need for a CPAP machine or other devices.  ACUTE DYSPNEA, LIKELY VIRAL ETIOLOGY: Your recent shortness of breath is likely due to a viral infection. Serious conditions have been ruled out during your recent emergency department visit. -Use an albuterol inhaler, two puffs every four hours as needed for shortness of breath. -We will order a pulmonary function test to check for any lung disease.  CHRONIC LOWER EXTREMITY EDEMA DUE TO VENOUS AND LYMPHATIC INSUFFICIENCY: You have chronic leg swelling due to venous insufficiency and possible lymphatic involvement. -Follow up with your cardiologist for a repeat echocardiogram.  HISTORY OF PULMONARY EMBOLISM AND DEEP VEIN THROMBOSIS: You have had two clotting events in the past. We discussed the risk of future events and the potential need for lifelong anticoagulation. -Monitor for any signs of new clotting events. -If a third clotting event occurs, we may need to consider lifelong anticoagulation.  CHRONIC BACK PAIN MANAGED WITH BUPRENORPHINE : Your chronic back pain is currently managed with buprenorphine . We discussed the potential impact on your sleep apnea and possible medication adjustments based on the sleep study results. -Continue your current  buprenorphine  regimen until we have the sleep study results. -We will discuss potential medication adjustments with your prescribing physician based on the sleep study results.                      Contains text generated by [employer]bridge.                                 Contains text generated by [employer]bridge.

## 2024-06-11 ENCOUNTER — Other Ambulatory Visit (HOSPITAL_COMMUNITY): Payer: Self-pay

## 2024-06-11 ENCOUNTER — Other Ambulatory Visit: Payer: Self-pay

## 2024-06-11 ENCOUNTER — Telehealth (HOSPITAL_BASED_OUTPATIENT_CLINIC_OR_DEPARTMENT_OTHER): Payer: Self-pay | Admitting: *Deleted

## 2024-06-11 ENCOUNTER — Encounter (HOSPITAL_COMMUNITY)
Admission: RE | Admit: 2024-06-11 | Discharge: 2024-06-11 | Disposition: A | Discharge status: Home / Self Care | Source: Ambulatory Visit | Attending: Urology | Admitting: Urology

## 2024-06-11 NOTE — Pre-Procedure Instructions (Signed)
 Dr Herschell reviewed chart and want patient to have a new echocardiogram because he was in ED 10/25 with CP and hasn't seen cardiology in over a year, before his surgery. Kourtney @ Dr Birdia office notified.

## 2024-06-11 NOTE — Telephone Encounter (Signed)
   Pre-operative Risk Assessment    Patient Name: Paul Sawyer  DOB: [birth year] MRN: [id number]   Date of last office visit: 12/13/22 DR. MALLIPEDDI Date of next office visit: 07/19/24 [name], [facility]   Request for Surgical Clearance    Procedure:  URETEROSCOPY AND STENT PLACEMENT   Date of Surgery:  Clearance 07/15/24                                Surgeon:  DR. PATRICK MCKENZIE Surgeon's Group or Practice Name:  [facility] Phone number:  [phone] Fax number:  [phone]   Type of Clearance Requested:   - Medical    Type of Anesthesia:  General    Additional requests/questions:    [name]   06/11/2024, 2:51 PM

## 2024-06-11 NOTE — Telephone Encounter (Signed)
 OV preop clearance has now been scheduled.

## 2024-06-11 NOTE — Telephone Encounter (Signed)
   Name: Paul Sawyer  DOB: 1971/07/25  MRN: [id number]  Primary Cardiologist: Diannah SHAUNNA Maywood, MD  Chart reviewed as part of pre-operative protocol coverage. Because of [name] past medical history and time since last visit, he will require a follow-up in-office visit in order to better assess preoperative cardiovascular risk.  Pre-op covering staff: - Please schedule appointment and call patient to inform them. If patient already had an upcoming appointment within acceptable timeframe, please add pre-op clearance to the appointment notes so provider is aware. - Please contact requesting surgeon's office via preferred method (i.e, phone, fax) to inform them of need for appointment prior to surgery.   Aaric Dolph E Fenton Candee, NP  06/11/2024, 3:02 PM

## 2024-06-30 ENCOUNTER — Other Ambulatory Visit: Payer: Self-pay

## 2024-06-30 ENCOUNTER — Other Ambulatory Visit (HOSPITAL_COMMUNITY): Payer: Self-pay

## 2024-06-30 ENCOUNTER — Encounter: Admitting: Urology

## 2024-07-12 ENCOUNTER — Telehealth: Payer: Self-pay

## 2024-07-12 NOTE — Progress Notes (Unsigned)
 Cardiology Office Note    Date:  07/14/2024  ID:  Paul Sawyer, DOB [birth year], MRN [id number] Cardiologist: Diannah SHAUNNA Maywood, MD { :  History of Present Illness:    Paul Sawyer is a 53 y.o. male with past medical history of CAD (s/p Coronary CTA in 05/2021 showing mild, nonobstructive CAD), history of DVT, prior gastric bypass and chronic venous insufficiency who presents to the office today for preoperative cardiac clearance.   He was examined by Dr. Mallipeddi in 12/2022 and reported still having occasional episodes of chest pain but not felt to be cardiac in etiology by description. Given his recent Coronary CTA, repeat ischemic testing was not pursued. Was not on a statin at the time and it was recommended to obtain an updated FLP. Was informed to follow-up in 2 years.   In the interim, the office received a cardiac clearance request for upcoming ureteroscopy and stent placement, therefore a follow-up visit was arranged. He was also evaluated in the ED on 06/05/2024 for worsening shortness of breath over the past 3 to 4 days. proBNP was normal at 112 and troponin values were negative. CTA showed no evidence of a PE but was noted to have an unchanged 9 mm solid right lower lobe nodular scar which had been noted on prior imaging and overall stable. EKG showed no acute ST changes. He was discharged home given his reassuring workup.  In talking with the patient today, he reports overall feeling well since recent ED evaluation. Says that he had a presumed viral illness around that timeframe and symptoms have significantly improved since. No recent orthopnea, PND or lower extremity edema. No recent exertional chest pain or palpitations. He was planning to undergo cystoscopy/stent placement tomorrow but canceled his surgery as he hopes to spontaneously pass the stone as compared to having stent placement.  Studies Reviewed:   EKG: EKG is not ordered today. EKG from 06/05/2024 is  reviewed and shows NSR, HR 62 with no acute ST changes.   Coronary CTA: 05/2021 IMPRESSION: 1. Coronary calcium score of 808. This was 99th percentile for age-, sex, and race-matched controls.   2. Normal coronary origin with right dominance.   3. Diffuse minimal calcified plaque (<25%) in the LAD/LCX.   4. Mild calcified plaque (25-49%) in the mid RCA.   RECOMMENDATIONS: 1. Mild non-obstructive CAD (25-49%). Consider non-atherosclerotic causes of chest pain. Consider preventive therapy and risk factor modification.  Echocardiogram: 06/2021 IMPRESSIONS     1. Left ventricular ejection fraction, by estimation, is 60 to 65%. The  left ventricle has normal function. The left ventricle has no regional  wall motion abnormalities. Left ventricular diastolic parameters were  normal.   2. Right ventricular systolic function is normal. The right ventricular  size is normal. Tricuspid regurgitation signal is inadequate for assessing  PA pressure.   3. The mitral valve is normal in structure. No evidence of mitral valve  regurgitation. No evidence of mitral stenosis.   4. The aortic valve was not well visualized. There is mild calcification  of the aortic valve. There is mild thickening of the aortic valve. Aortic  valve regurgitation is not visualized. No aortic stenosis is present.    Physical Exam:   VS:  BP 118/70   Pulse 68   Ht 6' 4.75 (1.949 m)   Wt 217 lb (98.4 kg)   SpO2 96%   BMI 25.90 kg/m    Wt Readings from Last 3 Encounters:  07/14/24 217 lb (98.4  kg)  06/08/24 217 lb (98.4 kg)  06/05/24 210 lb (95.3 kg)     GEN: Well nourished, well developed male appearing in no acute distress NECK: No JVD; No carotid bruits CARDIAC: RRR, no murmurs, rubs, gallops RESPIRATORY:  Clear to auscultation without rales, wheezing or rhonchi  ABDOMEN: Appears non-distended. No obvious abdominal masses. EXTREMITIES: No clubbing or cyanosis. No pitting edema.  Distal pedal pulses  are 2+ bilaterally.   Assessment and Plan:   1. Coronary artery calcification seen on CT scan - Prior Coronary CTA in 05/2021 showed mild, nonobstructive disease as discussed above. He denies any recent anginal symptoms and recent EKG showed no acute ST changes. Will continue to focus on risk factor modification and recheck an FLP and Hgb A1c as discussed below.  - He recently canceled his scheduled ureteroscopy and stent placement. If this were needing to be rescheduled in the near future, he would not require further cardiac workup given no recent anginal symptoms and his overall low RCRI Risk of 0.5% risk of a major cardiac event.   2. Screening for lipid disorders - Given his coronary calcification, goal LDL would be less than 70. Will recheck an FLP given the time-frame since last assessment. If LDL is slightly above goal, he wishes to focus on dietary changes initially. Would be open to cholesterol-lowering medications in the future if LDL remains above goal.   3. Screening for diabetes mellitus - No recent Hgb A1c on file and will recheck with upcoming labs.   Signed, Laymon CHRISTELLA Qua, PA-C

## 2024-07-12 NOTE — Telephone Encounter (Signed)
 Opened in error RR

## 2024-07-13 ENCOUNTER — Encounter (HOSPITAL_COMMUNITY)
Admission: RE | Admit: 2024-07-13 | Discharge: 2024-07-13 | Disposition: A | Discharge status: Home / Self Care | Source: Ambulatory Visit | Attending: Urology | Admitting: Urology

## 2024-07-13 NOTE — Progress Notes (Signed)
 Patient called to cancel the cysto with stent placement.  He doesn't want to go through with the stent and wants to try to pass it on his own. Dr Sherrilee aware and we will cancel for now.

## 2024-07-13 NOTE — Pre-Procedure Instructions (Signed)
 Called patient who, I need you to call back later, I am busy in a meeting. I explained to patient that I may not be able to get back with him because I have other patients to call. Well you can call me later or tomorrow. Thank you, and  hung up.

## 2024-07-14 ENCOUNTER — Ambulatory Visit: Attending: Student | Admitting: Student

## 2024-07-14 ENCOUNTER — Encounter: Payer: Self-pay | Admitting: Student

## 2024-07-14 VITALS — BP 118/70 | HR 68 | Ht 76.75 in | Wt 217.0 lb

## 2024-07-14 DIAGNOSIS — Z13220 Encounter for screening for lipoid disorders: Secondary | ICD-10-CM | POA: Diagnosis not present

## 2024-07-14 DIAGNOSIS — Z131 Encounter for screening for diabetes mellitus: Secondary | ICD-10-CM

## 2024-07-14 DIAGNOSIS — I2510 Atherosclerotic heart disease of native coronary artery without angina pectoris: Secondary | ICD-10-CM | POA: Diagnosis not present

## 2024-07-14 NOTE — Patient Instructions (Signed)
 Medication Instructions:  Your physician recommends that you continue on your current medications as directed. Please refer to the Current Medication list given to you today.  *If you need a refill on your cardiac medications before your next appointment, please call your pharmacy*  Lab Work: Your physician recommends that you return for lab work. Fasting ( FLP, A1C)   Please have this done at [facility]. (hours-Monday through Friday from 8:00 am to 4:00 pm except 11:30 am to 12:10 pm)  If you have labs (blood work) drawn today and your tests are completely normal, you will receive your results only by: MyChart Message (if you have MyChart) OR A paper copy in the mail If you have any lab test that is abnormal or we need to change your treatment, we will call you to review the results.  Testing/Procedures: NONE    Follow-Up: At [facility], you and your health needs are our priority.  As part of our continuing mission to provide you with exceptional heart care, our providers are all part of one team.  This team includes your primary Cardiologist (physician) and Advanced Practice Providers or APPs (Physician Assistants and Nurse Practitioners) who all work together to provide you with the care you need, when you need it.  Your next appointment:   1 year(s)  Provider:   You may see Vishnu P Mallipeddi, MD or one of the following Advanced Practice Providers on your designated Care Team:   Brittany Strader, PA-C  [place], [place] Olivia [place], [place]     We recommend signing up for the patient portal called MyChart.  Sign up information is provided on this After Visit Summary.  MyChart is used to connect with patients for Virtual Visits (Telemedicine).  Patients are able to view lab/test results, encounter notes, upcoming appointments, etc.  Non-urgent messages can be sent to your provider as well.   To learn more about what you can do with MyChart, go to  [email or web address].   Other Instructions Thank you for choosing [facility]!

## 2024-07-15 ENCOUNTER — Ambulatory Visit (HOSPITAL_COMMUNITY): Admission: RE | Admit: 2024-07-15 | Source: Home / Self Care | Admitting: Urology

## 2024-07-15 SURGERY — CYSTOSCOPY/URETEROSCOPY/HOLMIUM LASER/STENT PLACEMENT
Anesthesia: General | Laterality: Bilateral

## 2024-07-19 ENCOUNTER — Ambulatory Visit: Admitting: Physician Assistant

## 2024-07-20 ENCOUNTER — Other Ambulatory Visit: Payer: Self-pay

## 2024-07-20 ENCOUNTER — Other Ambulatory Visit (HOSPITAL_COMMUNITY): Payer: Self-pay

## 2024-07-28 ENCOUNTER — Encounter: Admitting: Urology

## 2024-08-06 ENCOUNTER — Other Ambulatory Visit (HOSPITAL_COMMUNITY): Payer: Self-pay

## 2024-08-09 ENCOUNTER — Other Ambulatory Visit (HOSPITAL_COMMUNITY): Payer: Self-pay

## 2024-08-10 ENCOUNTER — Other Ambulatory Visit (HOSPITAL_COMMUNITY): Payer: Self-pay

## 2024-08-11 ENCOUNTER — Other Ambulatory Visit: Payer: Self-pay

## 2024-08-17 ENCOUNTER — Ambulatory Visit (HOSPITAL_COMMUNITY): Admission: RE | Admit: 2024-08-17 | Source: Ambulatory Visit

## 2024-08-17 ENCOUNTER — Ambulatory Visit (HOSPITAL_BASED_OUTPATIENT_CLINIC_OR_DEPARTMENT_OTHER): Attending: Pulmonary Disease | Admitting: Pulmonary Disease

## 2024-08-17 DIAGNOSIS — G4736 Sleep related hypoventilation in conditions classified elsewhere: Secondary | ICD-10-CM | POA: Diagnosis not present

## 2024-08-17 DIAGNOSIS — G4710 Hypersomnia, unspecified: Secondary | ICD-10-CM | POA: Diagnosis present

## 2024-08-17 DIAGNOSIS — G4730 Sleep apnea, unspecified: Secondary | ICD-10-CM | POA: Insufficient documentation

## 2024-08-21 ENCOUNTER — Telehealth: Payer: Self-pay | Admitting: Pulmonary Disease

## 2024-08-21 DIAGNOSIS — G4733 Obstructive sleep apnea (adult) (pediatric): Secondary | ICD-10-CM | POA: Diagnosis not present

## 2024-08-21 NOTE — Telephone Encounter (Signed)
 Call patient  Sleep study result  Date of study: 08/17/2024  Impression: Moderate obstructive sleep apnea with moderate oxygen desaturations AHI of 21.7 with O2 nadir of 79%  Recommendation: Recommend CPAP therapy for moderate obstructive sleep apnea.  Auto-titrating CPAP with pressure settings of 5-15 should be appropriate, along with heated humidification using the patient's preferred mask-patient used a large ResMed AirFit F20 fullface mask. Avoid alcohol, sedatives and other CNS depressants that may worsen sleep apnea and disrupt normal sleep architecture. Sleep hygiene should be reviewed to assess factors that may improve sleep quality.  Schedule follow-up in the office 4 to 6 weeks after initiation of treatment

## 2024-08-21 NOTE — Procedures (Signed)
" °  Indications for Polysomnography The patient is a 54 year old Male who is 6' 4 and weighs 220.0 lbs. His BMI equals 26.8.  A full night polysomnogram was performed to evaluate for -.  Medications Taken:NO MEDICATIONS TAKEN. Polysomnogram Data A full night polysomnogram recorded the standard physiologic parameters including EEG, EOG, EMG, EKG, nasal and oral airflow.  Respiratory parameters of chest and abdominal movements were recorded with Respiratory Inductance Plethysmography belts.   Oxygen saturation was recorded by pulse oximetry.  Sleep Architecture The total recording time of the polysomnogram was 424.7 minutes.  The total sleep time was 362.5 minutes.  The patient spent 0.6% of total sleep time in Stage N1, 26.2% in Stage N2, 73.2% in Stages N3, and 0.0% in REM.  Sleep latency was 36.4 minutes.   REM latency was - minutes.  Sleep Efficiency was 85.4%.  Wake after Sleep Onset time was 26.0 minutes.  Respiratory Events The polysomnogram revealed a presence of 12 obstructive, 3 centrals, and - mixed apneas resulting in an Apnea index of 2.5 events per hour.  There were 116 hypopneas (GreaterEqual to3% desaturation and/or arousal) resulting in an Apnea\Hypopnea Index  (AHI GreaterEqual to3% desaturation and/or arousal) of 21.7 events per hour.  There were 84 hypopneas (GreaterEqual to4% desaturation) resulting in an Apnea\Hypopnea Index (AHI GreaterEqual to4% desaturation) of 16.4 events per hour.  There were 28  Respiratory Effort Related Arousals resulting in a RERA index of 4.6 events per hour. The Respiratory Disturbance Index is 26.3 events per hour.  The snore index was 154.4 events per hour.  Mean oxygen saturation was 93.4%.  The lowest oxygen saturation during sleep was 79.0%.  Time spent LessEqual to88% oxygen saturation was  minutes ().  Limb Activity There were - total limb movements recorded, of this total, - were classified as PLMs.  PLM index was - per hour and PLM  associated with Arousals index was - per hour.  Cardiac Summary The average pulse rate was 63.8 bpm.  The minimum pulse rate was 54.0 bpm while the maximum pulse rate was 97.0 bpm.  Cardiac rhythm was normal/abnormal.  Comments: Patient had a diagnostic polysomnogram performed  Diagnosis: Moderate obstructive sleep apnea with moderate oxygen desaturations AHI of 21.7 with O2 nadir of 79%.  Saturations below 88% for 14.5 minutes Good sleep efficiency, no REM sleep noted during the study No significant periodic limb movement Cardiac rhythm was sinus  Recommendations: Recommend CPAP therapy for moderate obstructive sleep apnea.  Auto-titrating CPAP with pressure settings of 5-15 should be appropriate, along with heated humidification using the patient's preferred mask-patient used a large ResMed AirFit F20 fullface mask. Avoid alcohol, sedatives and other CNS depressants that may worsen sleep apnea and disrupt normal sleep architecture. Sleep hygiene should be reviewed to assess factors that may improve sleep quality. Weight management and regular exercise should be initiated or continued  Schedule follow-up in the office 4 to 6 weeks after initiation of treatment   This study was personally reviewed and electronically signed by: NEDA JENNET LABOR, MD Accredited Board Certified in Sleep Medicine Date/Time:  08/21/24 "

## 2024-08-21 NOTE — Progress Notes (Signed)
 [facility] [address] [place], [place] [address] Tel: [phone]   Fax: [phone]  Polysomnography Interpretation  Patient Name:  Paul Sawyer, Paul Sawyer Date:  08/17/2024 Referring Physician:  PAULA SOUTHERLY [phone]) %%startinterp%% Indications for Polysomnography The patient is a 54 year old Male who is 6' 4 and weighs 220.0 lbs. His BMI equals 26.8.  A full night polysomnogram was performed to evaluate for -.  Medications Taken:  NO MEDICATIONS TAKEN.   Polysomnogram Data A full night polysomnogram recorded the standard physiologic parameters including EEG, EOG, EMG, EKG, nasal and oral airflow.  Respiratory parameters of chest and abdominal movements were recorded with Respiratory Inductance Plethysmography belts.  Oxygen saturation was recorded by pulse oximetry.   Sleep Architecture The total recording time of the polysomnogram was 424.7 minutes.  The total sleep time was 362.5 minutes.  The patient spent 0.6% of total sleep time in Stage N1, 26.2% in Stage N2, 73.2% in Stages N3, and 0.0% in REM.  Sleep latency was 36.4 minutes.  REM latency was - minutes.  Sleep Efficiency was 85.4%.  Wake after Sleep Onset time was 26.0 minutes.  Respiratory Events The polysomnogram revealed a presence of 12 obstructive, 3 centrals, and - mixed apneas resulting in an Apnea index of 2.5 events per hour.  There were 116 hypopneas (>=3% desaturation and/or arousal) resulting in an Apnea\Hypopnea Index (AHI >=3% desaturation and/or arousal) of 21.7 events per hour.  There were 84 hypopneas (>=4% desaturation) resulting in an Apnea\Hypopnea Index (AHI >=4% desaturation) of 16.4 events per hour.  There were 28 Respiratory Effort Related Arousals resulting in a RERA index of 4.6 events per hour. The Respiratory Disturbance Index is 26.3 events per hour.  The snore index was 154.4 events per hour.  Mean oxygen saturation was 93.4%.  The lowest oxygen saturation  during sleep was 79.0%.  Time spent <=88% oxygen saturation was 14.5 minutes (3.5%).  Limb Activity There were - total limb movements recorded, of this total, - were classified as PLMs.  PLM index was - per hour and PLM associated with Arousals index was - per hour.  Cardiac Summary The average pulse rate was 63.8 bpm.  The minimum pulse rate was 54.0 bpm while the maximum pulse rate was 97.0 bpm.  Cardiac rhythm was normal/abnormal.  Comments:  Patient had a diagnostic polysomnogram performed  Diagnosis:  Moderate obstructive sleep apnea with moderate oxygen desaturations AHI of 21.7 with O2 nadir of 79%.  Saturations below 88% for 14.5 minutes Good sleep efficiency, no REM sleep noted during the study No significant periodic limb movement Cardiac rhythm was sinus  Recommendations: Recommend CPAP therapy for moderate obstructive sleep apnea.  Auto-titrating CPAP with pressure settings of 5-15 should be appropriate, along with heated humidification using the patient's preferred mask-patient used a large ResMed AirFit F20 fullface mask. Avoid alcohol, sedatives and other CNS depressants that may worsen sleep apnea and disrupt normal sleep architecture. Sleep hygiene should be reviewed to assess factors that may improve sleep quality. Weight management and regular exercise should be initiated or continued  Schedule follow-up in the office 4 to 6 weeks after initiation of treatment   This study was personally reviewed and electronically signed by: NEDA JENNET LABOR, MD Accredited Board Certified in Sleep Medicine Date/Time:   08/21/24    Diagnostic PSG Report  Patient Name: Paul Sawyer, Paul Sawyer Study Date: 08/17/2024  Date of Birth: 1970-08-15 Study Type: Diagnostic  Age: 67 year MRN #: [id number]  Sex: Male Interpreting Physician:  NEDA HAMMOND, [id number]  Height: 6' 4 Referring Physician: PAULA SOUTHERLY [phone])  Weight: 220.0 lbs Recording Tech: Charlie George RPSGT  BMI:  26.8 Scoring Tech: Charlie George RPSGT  ESS: 15/24 Neck Size: 14   Study Overview  Lights Off: 09:55:34 PM  Count Index  Lights On: 05:00:14 AM Awakenings: 3 0.5  Time in Bed: 424.7 min. Arousals: 50 8.3  Total Sleep Time: 362.5 min. AHI (>=3% Desat and/or Ar.): 131 21.7   Sleep Efficiency: 85.4% AHI (>=4% Desat): 99 16.4   Sleep Latency: 36.4 min. Limb Movements: - -  Wake After Sleep Onset: 26.0 min. Snore: 933 154.4  REM Latency from Sleep Onset: - min. Desaturations: 128 21.2     Minimum SpO2 TST: 79.0%    Sleep Architecture  % of Time in Bed Stages Time (mins) % Sleep Time  Wake 62.5   Stage N1 2.0 0.6%  Stage N2 95.0 26.2%  Stage N3 265.5 73.2%  REM 0.0 0.0%   Arousal Summary   NREM REM Sleep Index  Respiratory Arousals 47 - 47 7.8  PLM Arousals - - - -  Isolated Limb Movement Arousals - - - -  Snore Arousals 1 - 1 0.2  Spontaneous Arousals 2 - 2 0.3  Total 50 - 50 8.3   Limb Movement Summary   Count Index  Isolated Limb Movements - -  Periodic Limb Movements (PLMs) - -  Total Limb Movements - -    Respiratory Summary   By Sleep Stage By Body Position Total   NREM REM Supine Non-Supine   Time (min) 362.5 0.0 348.5 14.0 362.5         Obstructive Apnea 12 - 12 - 12  Mixed Apnea - - - - -  Central Apnea 3 - 3 - 3  Total Apneas 15 - 15 - 15  Total Apnea Index 2.5 - 2.6 - 2.5         Hypopneas (>=3% Desat and/or Ar.) 116 - 116 - 116  AHI (>=3% Desat and/or Ar.) 21.7 - 22.6 - 21.7         Hypopneas (>=4% Desat) 84 - 84 - 84  AHI (>=4% Desat) 16.4 - 17.0 - 16.4          RERAs 28 - 28 - 28  RERA Index 4.6 - 4.8 - 4.6         RDI 26.3 - 27.4 - 26.3    Respiratory Event Type Index  Central Apneas 0.5  Obstructive Apneas 2.0  Mixed Apneas -  Central Hypopneas -  Obstructive Hypopneas 19.0  Central Apnea + Hypopnea (CAHI) 0.5  Obstructive Apnea + Hypopnea (OAHI) 21.2   Respiratory Event Durations   Apnea Hypopnea   NREM REM NREM REM  Average  (seconds) 21.9 - 38.9 -  Maximum (seconds) 53.3 - 90.0 -    Oxygen Saturation Summary   Wake NREM REM TST TIB  Average SpO2 (%) 95.9% 93.1% - 93.1% 93.4%  Minimum SpO2 (%) 88.0% 79.0% - 79.0% 79.0%  Maximum SpO2 (%) 99.0% 98.0% - 98.0% 99.0%   Oxygen Saturation Distribution  Range (%) Time in range (min) Time in range (%)  90.0 - 100.0 376.6 90.6%  80.0 - 90.0 37.9 9.1%  70.0 - 80.0 0.4 0.1%  60.0 - 70.0 - -  50.0 - 60.0 - -  0.0 - 50.0 - -  Time Spent <=88% SpO2  Range (%) Time in range (min) Time in range (%)  0.0 - 88.0 14.5  3.5%      Count Index  Desaturations 128 21.2    Cardiac Summary   Wake NREM REM Sleep Total  Average Pulse Rate (BPM) 66.9 63.3 - 63.3 63.8  Minimum Pulse Rate (BPM) 54.0 54.0 - 54.0 54.0  Maximum Pulse Rate (BPM) 97.0 84.0 - 84.0 97.0   Pulse Rate Distribution:  Range (bpm) Time in range (min) Time in range (%)  0.0 - 40.0 - -  40.0 - 60.0 78.2 18.8%  60.0 - 80.0 335.0 80.4%  80.0 - 100.0 2.7 0.6%  100.0 - 120.0 - -  120.0 - 140.0 - -  140.0 - 200.0 - -      Hypnograms                      Technologist Comments            The patient arrived for a split night study. The patient was placed in room 1. The procedure was explained, and questions were answered. The patient was fitted with a mask and trialed CPAP at a pressure of 4-8 cm H2O with an EPR of 2 prior to the start of the study.           The patient did not have a high enough AHI to meet split night criteria. The patient was fitted with a small ResMed AirFit P10 nasal pillow mask, a medium ResMed AirFit F30 full-face mask, and a large ResMed AirFit F20 full-face mask. The patient preferred the ResMed AirFit F20 full-face mask.          The patient slept in the supine and left positions. Severe oral venting was observed. No PLMs, bruxism, seizure, or spike wave activity was observed. Snoring was moderate and audible. No ECG events were observed. Stages N1, N2,  and N3 were recorded. The patient had one-bathroom break.

## 2024-08-21 NOTE — Procedures (Signed)
 [facility] [address] [place], [place] [address] Tel: [phone]   Fax: [phone]  Polysomnography Interpretation  Patient Name:  Paul Sawyer, Paul Sawyer Date:  08/17/2024 Referring Physician:  PAULA SOUTHERLY [phone])   Indications for Polysomnography The patient is a 54 year old Male who is 6' 4 and weighs 220.0 lbs. His BMI equals 26.8.  A full night polysomnogram was performed to evaluate for -.  Medications Taken:  NO MEDICATIONS TAKEN.   Polysomnogram Data A full night polysomnogram recorded the standard physiologic parameters including EEG, EOG, EMG, EKG, nasal and oral airflow.  Respiratory parameters of chest and abdominal movements were recorded with Respiratory Inductance Plethysmography belts.  Oxygen saturation was recorded by pulse oximetry.   Sleep Architecture The total recording time of the polysomnogram was 424.7 minutes.  The total sleep time was 362.5 minutes.  The patient spent 0.6% of total sleep time in Stage N1, 26.2% in Stage N2, 73.2% in Stages N3, and 0.0% in REM.  Sleep latency was 36.4 minutes.  REM latency was - minutes.  Sleep Efficiency was 85.4%.  Wake after Sleep Onset time was 26.0 minutes.  Respiratory Events The polysomnogram revealed a presence of 12 obstructive, 3 centrals, and - mixed apneas resulting in an Apnea index of 2.5 events per hour.  There were 116 hypopneas (>=3% desaturation and/or arousal) resulting in an Apnea\Hypopnea Index (AHI >=3% desaturation and/or arousal) of 21.7 events per hour.  There were 84 hypopneas (>=4% desaturation) resulting in an Apnea\Hypopnea Index (AHI >=4% desaturation) of 16.4 events per hour.  There were 28 Respiratory Effort Related Arousals resulting in a RERA index of 4.6 events per hour. The Respiratory Disturbance Index is 26.3 events per hour.  The snore index was 154.4 events per hour.  Mean oxygen saturation was 93.4%.  The lowest oxygen saturation during sleep was  79.0%.  Time spent <=88% oxygen saturation was 14.5 minutes (3.5%).  Limb Activity There were - total limb movements recorded, of this total, - were classified as PLMs.  PLM index was - per hour and PLM associated with Arousals index was - per hour.  Cardiac Summary The average pulse rate was 63.8 bpm.  The minimum pulse rate was 54.0 bpm while the maximum pulse rate was 97.0 bpm.  Cardiac rhythm was normal/abnormal.  Comments:  Patient had a diagnostic polysomnogram performed  Diagnosis:  Moderate obstructive sleep apnea with moderate oxygen desaturations AHI of 21.7 with O2 nadir of 79%.  Saturations below 88% for 14.5 minutes Good sleep efficiency, no REM sleep noted during the study No significant periodic limb movement Cardiac rhythm was sinus  Recommendations: Recommend CPAP therapy for moderate obstructive sleep apnea.  Auto-titrating CPAP with pressure settings of 5-15 should be appropriate, along with heated humidification using the patient's preferred mask-patient used a large ResMed AirFit F20 fullface mask. Avoid alcohol, sedatives and other CNS depressants that may worsen sleep apnea and disrupt normal sleep architecture. Sleep hygiene should be reviewed to assess factors that may improve sleep quality. Weight management and regular exercise should be initiated or continued  Schedule follow-up in the office 4 to 6 weeks after initiation of treatment   This study was personally reviewed and electronically signed by: NEDA JENNET LABOR, MD Accredited Board Certified in Sleep Medicine Date/Time:   08/21/24    Diagnostic PSG Report  Patient Name: Paul Sawyer, Paul Sawyer Study Date: 08/17/2024  Date of Birth: [birth year] Study Type: Diagnostic  Age: 41 year MRN #: [id number]  Sex: Male Interpreting  Physician: NEDA HAMMOND, [id number]  Height: 6' 4 Referring Physician: PAULA SOUTHERLY [id number])  Weight: 220.0 lbs Recording Tech: Charlie George RPSGT  BMI: 26.8 Scoring Tech:  Charlie George RPSGT  ESS: 15/24 Neck Size: 14   Study Overview  Lights Off: 09:55:34 PM  Count Index  Lights On: 05:00:14 AM Awakenings: 3 0.5  Time in Bed: 424.7 min. Arousals: 50 8.3  Total Sleep Time: 362.5 min. AHI (>=3% Desat and/or Ar.): 131 21.7   Sleep Efficiency: 85.4% AHI (>=4% Desat): 99 16.4   Sleep Latency: 36.4 min. Limb Movements: - -  Wake After Sleep Onset: 26.0 min. Snore: 933 154.4  REM Latency from Sleep Onset: - min. Desaturations: 128 21.2     Minimum SpO2 TST: 79.0%    Sleep Architecture  % of Time in Bed Stages Time (mins) % Sleep Time  Wake 62.5   Stage N1 2.0 0.6%  Stage N2 95.0 26.2%  Stage N3 265.5 73.2%  REM 0.0 0.0%   Arousal Summary   NREM REM Sleep Index  Respiratory Arousals 47 - 47 7.8  PLM Arousals - - - -  Isolated Limb Movement Arousals - - - -  Snore Arousals 1 - 1 0.2  Spontaneous Arousals 2 - 2 0.3  Total 50 - 50 8.3   Limb Movement Summary   Count Index  Isolated Limb Movements - -  Periodic Limb Movements (PLMs) - -  Total Limb Movements - -    Respiratory Summary   By Sleep Stage By Body Position Total   NREM REM Supine Non-Supine   Time (min) 362.5 0.0 348.5 14.0 362.5         Obstructive Apnea 12 - 12 - 12  Mixed Apnea - - - - -  Central Apnea 3 - 3 - 3  Total Apneas 15 - 15 - 15  Total Apnea Index 2.5 - 2.6 - 2.5         Hypopneas (>=3% Desat and/or Ar.) 116 - 116 - 116  AHI (>=3% Desat and/or Ar.) 21.7 - 22.6 - 21.7         Hypopneas (>=4% Desat) 84 - 84 - 84  AHI (>=4% Desat) 16.4 - 17.0 - 16.4          RERAs 28 - 28 - 28  RERA Index 4.6 - 4.8 - 4.6         RDI 26.3 - 27.4 - 26.3    Respiratory Event Type Index  Central Apneas 0.5  Obstructive Apneas 2.0  Mixed Apneas -  Central Hypopneas -  Obstructive Hypopneas 19.0  Central Apnea + Hypopnea (CAHI) 0.5  Obstructive Apnea + Hypopnea (OAHI) 21.2   Respiratory Event Durations   Apnea Hypopnea   NREM REM NREM REM  Average (seconds) 21.9 -  38.9 -  Maximum (seconds) 53.3 - 90.0 -    Oxygen Saturation Summary   Wake NREM REM TST TIB  Average SpO2 (%) 95.9% 93.1% - 93.1% 93.4%  Minimum SpO2 (%) 88.0% 79.0% - 79.0% 79.0%  Maximum SpO2 (%) 99.0% 98.0% - 98.0% 99.0%   Oxygen Saturation Distribution  Range (%) Time in range (min) Time in range (%)  90.0 - 100.0 376.6 90.6%  80.0 - 90.0 37.9 9.1%  70.0 - 80.0 0.4 0.1%  60.0 - 70.0 - -  50.0 - 60.0 - -  0.0 - 50.0 - -  Time Spent <=88% SpO2  Range (%) Time in range (min) Time in range (%)  0.0 - 88.0  14.5 3.5%      Count Index  Desaturations 128 21.2    Cardiac Summary   Wake NREM REM Sleep Total  Average Pulse Rate (BPM) 66.9 63.3 - 63.3 63.8  Minimum Pulse Rate (BPM) 54.0 54.0 - 54.0 54.0  Maximum Pulse Rate (BPM) 97.0 84.0 - 84.0 97.0   Pulse Rate Distribution:  Range (bpm) Time in range (min) Time in range (%)  0.0 - 40.0 - -  40.0 - 60.0 78.2 18.8%  60.0 - 80.0 335.0 80.4%  80.0 - 100.0 2.7 0.6%  100.0 - 120.0 - -  120.0 - 140.0 - -  140.0 - 200.0 - -      Hypnograms                      Technologist Comments            The patient arrived for a split night study. The patient was placed in room 1. The procedure was explained, and questions were answered. The patient was fitted with a mask and trialed CPAP at a pressure of 4-8 cm H2O with an EPR of 2 prior to the start of the study.           The patient did not have a high enough AHI to meet split night criteria. The patient was fitted with a small ResMed AirFit P10 nasal pillow mask, a medium ResMed AirFit F30 full-face mask, and a large ResMed AirFit F20 full-face mask. The patient preferred the ResMed AirFit F20 full-face mask.          The patient slept in the supine and left positions. Severe oral venting was observed. No PLMs, bruxism, seizure, or spike wave activity was observed. Snoring was moderate and audible. No ECG events were observed. Stages N1, N2, and N3 were  recorded. The patient had one-bathroom break.

## 2024-08-23 NOTE — Telephone Encounter (Signed)
 Patient has scheduled f/u on 09/20/24 at the [facility] office with Dr. Catherine.  Nothing further needed.

## 2024-08-23 NOTE — Telephone Encounter (Signed)
 Happy to see him to explain things. However, CPAP will fix the oxygen desats at night. He does not need oxygen. He needs CPAP.

## 2024-08-23 NOTE — Telephone Encounter (Signed)
 Called and informed pt of his sleep results pt informed me that he had missed his appt but I tild hime at this point we wouldn't need to seem him until his 31 day compliance with cpap and anytime after that he can make an appt but the pt is having concerns with o2 and if he needs to be wearing o2 due to his o2 desats at night and I he wants an appt to go over that   Also told him we ordered cpap

## 2024-09-13 ENCOUNTER — Other Ambulatory Visit (HOSPITAL_COMMUNITY): Payer: Self-pay

## 2024-09-13 MED ORDER — BUPRENORPHINE HCL-NALOXONE HCL 2-0.5 MG SL SUBL
2.0000 | SUBLINGUAL_TABLET | Freq: Three times a day (TID) | SUBLINGUAL | 0 refills | Status: AC
  Filled 2024-09-13 – 2024-09-15 (×2): qty 90, 15d supply, fill #0

## 2024-09-14 ENCOUNTER — Other Ambulatory Visit (HOSPITAL_COMMUNITY): Payer: Self-pay

## 2024-09-15 ENCOUNTER — Other Ambulatory Visit (HOSPITAL_COMMUNITY): Payer: Self-pay

## 2024-09-16 ENCOUNTER — Telehealth (HOSPITAL_BASED_OUTPATIENT_CLINIC_OR_DEPARTMENT_OTHER): Payer: Self-pay

## 2024-09-16 NOTE — Telephone Encounter (Signed)
 CMN received for CPAP supplies sent to [facility] signed by provider and faxed confirmation received

## 2024-09-20 ENCOUNTER — Ambulatory Visit: Admitting: Pulmonary Disease

## 2024-09-20 DIAGNOSIS — G4710 Hypersomnia, unspecified: Secondary | ICD-10-CM

## 2024-09-20 DIAGNOSIS — R0609 Other forms of dyspnea: Secondary | ICD-10-CM

## 2024-10-27 ENCOUNTER — Ambulatory Visit: Admitting: Urology
